# Patient Record
Sex: Male | Born: 1945 | Race: White | Hispanic: No | Marital: Married | Smoking: Never smoker
Health system: Southern US, Community
[De-identification: ages and names within clinical notes are randomized; demographics above are authoritative.]

## PROBLEM LIST (undated history)

## (undated) DIAGNOSIS — K219 Gastro-esophageal reflux disease without esophagitis: Secondary | ICD-10-CM

## (undated) DIAGNOSIS — I2510 Atherosclerotic heart disease of native coronary artery without angina pectoris: Secondary | ICD-10-CM

## (undated) DIAGNOSIS — F410 Panic disorder [episodic paroxysmal anxiety] without agoraphobia: Secondary | ICD-10-CM

## (undated) DIAGNOSIS — Z9889 Other specified postprocedural states: Secondary | ICD-10-CM

## (undated) DIAGNOSIS — F419 Anxiety disorder, unspecified: Secondary | ICD-10-CM

## (undated) DIAGNOSIS — I10 Essential (primary) hypertension: Secondary | ICD-10-CM

## (undated) DIAGNOSIS — R112 Nausea with vomiting, unspecified: Secondary | ICD-10-CM

## (undated) DIAGNOSIS — M1990 Unspecified osteoarthritis, unspecified site: Secondary | ICD-10-CM

## (undated) DIAGNOSIS — E785 Hyperlipidemia, unspecified: Secondary | ICD-10-CM

## (undated) DIAGNOSIS — N400 Enlarged prostate without lower urinary tract symptoms: Secondary | ICD-10-CM

## (undated) HISTORY — PX: TONSILLECTOMY AND ADENOIDECTOMY: SUR1326

## (undated) HISTORY — DX: Benign prostatic hyperplasia without lower urinary tract symptoms: N40.0

## (undated) HISTORY — DX: Hyperlipidemia, unspecified: E78.5

## (undated) HISTORY — PX: JOINT REPLACEMENT: SHX530

## (undated) HISTORY — PX: OTHER SURGICAL HISTORY: SHX169

## (undated) HISTORY — PX: KNEE ARTHROSCOPY: SUR90

## (undated) HISTORY — PX: MOHS SURGERY: SUR867

## (undated) HISTORY — PX: FEMUR SURGERY: SHX943

## (undated) HISTORY — PX: APPENDECTOMY: SHX54

## (undated) HISTORY — PX: NASAL SEPTUM SURGERY: SHX37

---

## 1993-04-04 DIAGNOSIS — C4491 Basal cell carcinoma of skin, unspecified: Secondary | ICD-10-CM

## 1993-04-04 HISTORY — DX: Basal cell carcinoma of skin, unspecified: C44.91

## 2004-11-15 ENCOUNTER — Ambulatory Visit (HOSPITAL_COMMUNITY): Admission: RE | Admit: 2004-11-15 | Discharge: 2004-11-15 | Payer: Self-pay | Admitting: Gastroenterology

## 2012-01-01 ENCOUNTER — Encounter: Payer: Self-pay | Admitting: Cardiology

## 2012-01-01 ENCOUNTER — Ambulatory Visit (HOSPITAL_COMMUNITY)
Admission: RE | Admit: 2012-01-01 | Discharge: 2012-01-01 | Disposition: A | Discharge status: Home / Self Care | Payer: 59 | Source: Ambulatory Visit | Attending: Internal Medicine | Admitting: Internal Medicine

## 2012-01-01 ENCOUNTER — Encounter: Payer: Self-pay | Admitting: *Deleted

## 2012-01-01 ENCOUNTER — Ambulatory Visit (INDEPENDENT_AMBULATORY_CARE_PROVIDER_SITE_OTHER): Payer: 59 | Admitting: Cardiology

## 2012-01-01 DIAGNOSIS — E785 Hyperlipidemia, unspecified: Secondary | ICD-10-CM

## 2012-01-01 DIAGNOSIS — R9431 Abnormal electrocardiogram [ECG] [EKG]: Secondary | ICD-10-CM | POA: Insufficient documentation

## 2012-01-01 DIAGNOSIS — R079 Chest pain, unspecified: Secondary | ICD-10-CM

## 2012-01-01 DIAGNOSIS — R9439 Abnormal result of other cardiovascular function study: Secondary | ICD-10-CM

## 2012-01-01 NOTE — Assessment & Plan Note (Signed)
I will defer to GREEN, Lorenda Ishihara, MD, MD.  The goals for LDL /HDL will be based on the presence or absence of CAD

## 2012-01-01 NOTE — Assessment & Plan Note (Signed)
The patient has an abnormal stress test as described. This likely indicates coronary artery disease. Cardiac catheterization is indicated. He would like to wait until his wife is back in town as she's going out of town this weekend. He's not having any unstable symptoms. He has been started on aspirin and a low-dose beta blocker. He will not perform any of his usual physical activities. He'll come back for elective cardiac catheterization. I have discussed with him all the risks benefits. He also knows if he has any unstable symptoms such as resting discomfort he needs to call 911.

## 2012-01-01 NOTE — Progress Notes (Signed)
 HPI The patient is referred for evaluation of an abnormal exercise treadmill test. He has no past cardiac history. However, over the last month he has noticed some chest discomfort. He first noticed this walking up a trail hill.  He noticed some slight left-sided discomfort. He thought he had more dyspnea with exertion than previous. He may have felt less exercise tolerance. He says he's had a persistent mild discomfort now that he thinks about it for several weeks. However, he was ignoring this until he started getting some lightheadedness with exertion accompanying the discomfort. He saw his primary provider today and had an exercise treadmill test. At 4-1/2 minutes there was some chest discomfort. He had ST depression in lead V4.  this progressed to be fairly marked ST depression in leads V4 through V6 and the inferior leads by 7 minutes. This resolved by 2 minutes of recovery.  The patient otherwise has had no past cardiac workup. He's had no PND or orthopnea. He denies any palpitations, presyncope or syncope. He is active despite having joint pains particularly his knees.  Not on File  Current Outpatient Prescriptions  Medication Sig Dispense Refill  . aspirin 81 MG tablet Take 160 mg by mouth daily.      . Omeprazole Magnesium (PRILOSEC OTC PO) Take by mouth.        Past Medical History  Diagnosis Date  . Hyperlipidemia   . BPH (benign prostatic hyperplasia)     Past Surgical History  Procedure Date  . Knee arthroscopy     bilateral  . Appendectomy   . Tonsillectomy and adenoidectomy   . Nasal septum surgery   . Femur surgery     Family History  Problem Relation Age of Onset  . Coronary artery disease Father     "Hardening of the arteries"    History   Social History  . Marital Status: Married    Spouse Name: N/A    Number of Children: 1  . Years of Education: N/A   Occupational History  . Not on file.   Social History Main Topics  . Smoking status: Never  Smoker   . Smokeless tobacco: Not on file  . Alcohol Use: Not on file  . Drug Use: Not on file  . Sexually Active: Not on file   Other Topics Concern  . Not on file   Social History Narrative   Artist, makes furniture.   ROS:  Constipation.  Otherwise as stated in the HPI and negative for all other systems.  PHYSICAL EXAM BP 110/80  Pulse 68  Ht 6' 6" (1.981 m)  Wt 176 lb (79.833 kg)  BMI 20.34 kg/m2 GENERAL:  Well appearing HEENT:  Pupils equal round and reactive, fundi not visualized, oral mucosa unremarkable NECK:  No jugular venous distention, waveform within normal limits, carotid upstroke brisk and symmetric, no bruits, no thyromegaly LYMPHATICS:  No cervical, inguinal adenopathy LUNGS:  Clear to auscultation bilaterally BACK:  No CVA tenderness CHEST:  Unremarkable HEART:  PMI not displaced or sustained,S1 and S2 within normal limits, no S3, no S4, no clicks, no rubs, no murmurs ABD:  Flat, positive bowel sounds normal in frequency in pitch, no bruits, no rebound, no guarding, no midline pulsatile mass, no hepatomegaly, no splenomegaly EXT:  2 plus pulses throughout, no edema, no cyanosis no clubbing SKIN:  No rashes no nodules NEURO:  Cranial nerves II through XII grossly intact, motor grossly intact throughout PSYCH:  Cognitively intact, oriented to person place and   time  EKG:  Sinus rhythm, rate 68, nonspecific inferior T wave inversions, rightward axis, early transition in lead V2. 01/01/2012   ASSESSMENT AND PLAN    

## 2012-01-01 NOTE — Patient Instructions (Addendum)
Your physician has requested that you have a cardiac catheterization. Cardiac catheterization is used to diagnose and/or treat various heart conditions. Doctors may recommend this procedure for a number of different reasons. The most common reason is to evaluate chest pain. Chest pain can be a symptom of coronary artery disease (CAD), and cardiac catheterization can show whether plaque is narrowing or blocking your heart's arteries. This procedure is also used to evaluate the valves, as well as measure the blood flow and oxygen levels in different parts of your heart. For further information please visit [email or web address] Please follow instruction sheet, as given.  The current medical regimen is effective;  continue present plan and medications.  Please have blood work today

## 2012-01-02 ENCOUNTER — Encounter (HOSPITAL_COMMUNITY): Payer: Self-pay | Admitting: Respiratory Therapy

## 2012-01-02 LAB — CBC WITH DIFFERENTIAL/PLATELET
Basophils Absolute: 0.1 10*3/uL (ref 0.0–0.1)
Eosinophils Relative: 3.5 % (ref 0.0–5.0)
HCT: 40.5 % (ref 39.0–52.0)
Hemoglobin: 13.6 g/dL (ref 13.0–17.0)
Lymphs Abs: 1.8 10*3/uL (ref 0.7–4.0)
MCV: 96.9 fl (ref 78.0–100.0)
Monocytes Absolute: 0.5 10*3/uL (ref 0.1–1.0)
Monocytes Relative: 6.6 % (ref 3.0–12.0)
Neutro Abs: 4.2 10*3/uL (ref 1.4–7.7)
Platelets: 212 10*3/uL (ref 150.0–400.0)
RDW: 13.1 % (ref 11.5–14.6)

## 2012-01-02 LAB — BASIC METABOLIC PANEL
BUN: 17 mg/dL (ref 6–23)
Chloride: 102 mEq/L (ref 96–112)
GFR: 82.23 mL/min (ref >60.00)
Glucose, Bld: 85 mg/dL (ref 70–99)
Potassium: 4 mEq/L (ref 3.5–5.1)
Sodium: 140 mEq/L (ref 135–145)

## 2012-01-04 ENCOUNTER — Other Ambulatory Visit: Payer: Self-pay | Admitting: Cardiology

## 2012-01-05 ENCOUNTER — Encounter (HOSPITAL_COMMUNITY): Payer: Self-pay | Admitting: *Deleted

## 2012-01-05 ENCOUNTER — Other Ambulatory Visit: Payer: Self-pay

## 2012-01-05 ENCOUNTER — Encounter (HOSPITAL_COMMUNITY)
Admission: RE | Disposition: A | Discharge status: Home / Self Care | Payer: Self-pay | Source: Ambulatory Visit | Attending: Cardiology

## 2012-01-05 ENCOUNTER — Ambulatory Visit (HOSPITAL_COMMUNITY)
Admission: RE | Admit: 2012-01-05 | Discharge: 2012-01-06 | Disposition: A | Discharge status: Home / Self Care | Payer: 59 | Source: Ambulatory Visit | Attending: Cardiology | Admitting: Cardiology

## 2012-01-05 DIAGNOSIS — I209 Angina pectoris, unspecified: Secondary | ICD-10-CM | POA: Diagnosis not present

## 2012-01-05 DIAGNOSIS — E785 Hyperlipidemia, unspecified: Secondary | ICD-10-CM | POA: Diagnosis not present

## 2012-01-05 DIAGNOSIS — I369 Nonrheumatic tricuspid valve disorder, unspecified: Secondary | ICD-10-CM

## 2012-01-05 DIAGNOSIS — I2510 Atherosclerotic heart disease of native coronary artery without angina pectoris: Secondary | ICD-10-CM

## 2012-01-05 DIAGNOSIS — R9439 Abnormal result of other cardiovascular function study: Secondary | ICD-10-CM

## 2012-01-05 HISTORY — DX: Atherosclerotic heart disease of native coronary artery without angina pectoris: I25.10

## 2012-01-05 HISTORY — PX: LEFT HEART CATHETERIZATION WITH CORONARY ANGIOGRAM: SHX5451

## 2012-01-05 HISTORY — DX: Unspecified osteoarthritis, unspecified site: M19.90

## 2012-01-05 LAB — POCT ACTIVATED CLOTTING TIME: Activated Clotting Time: 309 seconds

## 2012-01-05 SURGERY — LEFT HEART CATHETERIZATION WITH CORONARY ANGIOGRAM
Anesthesia: LOCAL

## 2012-01-05 MED ORDER — BIVALIRUDIN 250 MG IV SOLR
0.2500 mg/kg | Freq: Once | INTRAVENOUS | Status: AC
  Administered 2012-01-05: 19.7 mg via INTRAVENOUS

## 2012-01-05 MED ORDER — ONDANSETRON HCL 4 MG/2ML IJ SOLN: 4.0000 mg | Freq: Four times a day (QID) | INTRAMUSCULAR | Status: DC | PRN

## 2012-01-05 MED ORDER — MIDAZOLAM HCL 2 MG/2ML IJ SOLN
INTRAMUSCULAR | Status: AC
  Filled 2012-01-05: qty 2

## 2012-01-05 MED ORDER — ASPIRIN 81 MG PO CHEW: 81.0000 mg | CHEWABLE_TABLET | Freq: Every day | ORAL | Status: DC

## 2012-01-05 MED ORDER — OXYCODONE-ACETAMINOPHEN 5-325 MG PO TABS
1.0000 | ORAL_TABLET | ORAL | Status: DC | PRN
Start: 2012-01-05 — End: 2012-01-06
  Administered 2012-01-05: 2 via ORAL
  Administered 2012-01-06: 1 via ORAL
  Filled 2012-01-05: qty 1
  Filled 2012-01-05: qty 2

## 2012-01-05 MED ORDER — METOPROLOL SUCCINATE ER 50 MG PO TB24
50.0000 mg | ORAL_TABLET | Freq: Every day | ORAL | Status: DC
  Administered 2012-01-06: 50 mg via ORAL
  Filled 2012-01-05: qty 1

## 2012-01-05 MED ORDER — ALPRAZOLAM 0.25 MG PO TABS
0.2500 mg | ORAL_TABLET | Freq: Two times a day (BID) | ORAL | Status: DC | PRN
  Administered 2012-01-05: 0.25 mg via ORAL
  Filled 2012-01-05: qty 1

## 2012-01-05 MED ORDER — NITROGLYCERIN 0.2 MG/ML ON CALL CATH LAB
INTRAVENOUS | Status: AC
  Filled 2012-01-05: qty 1

## 2012-01-05 MED ORDER — VERAPAMIL HCL 2.5 MG/ML IV SOLN
INTRAVENOUS | Status: AC
  Filled 2012-01-05: qty 2

## 2012-01-05 MED ORDER — SODIUM CHLORIDE 0.9 % IJ SOLN: 3.0000 mL | INTRAMUSCULAR | Status: DC | PRN

## 2012-01-05 MED ORDER — LIDOCAINE HCL (PF) 1 % IJ SOLN
INTRAMUSCULAR | Status: AC
  Filled 2012-01-05: qty 30

## 2012-01-05 MED ORDER — SODIUM CHLORIDE 0.9 % IV SOLN: INTRAVENOUS | Status: AC

## 2012-01-05 MED ORDER — LIDOCAINE-EPINEPHRINE 1 %-1:100000 IJ SOLN
INTRAMUSCULAR | Status: AC
  Filled 2012-01-05: qty 1

## 2012-01-05 MED ORDER — ASPIRIN 81 MG PO CHEW
324.0000 mg | CHEWABLE_TABLET | ORAL | Status: AC
  Administered 2012-01-05: 324 mg via ORAL

## 2012-01-05 MED ORDER — LIDOCAINE-EPINEPHRINE 1 %-1:100000 IJ SOLN
6.0000 mL | Freq: Once | INTRAMUSCULAR | Status: AC
  Administered 2012-01-05: 6 mL via INTRADERMAL

## 2012-01-05 MED ORDER — FENTANYL CITRATE 0.05 MG/ML IJ SOLN
INTRAMUSCULAR | Status: AC
  Filled 2012-01-05: qty 2

## 2012-01-05 MED ORDER — CLOPIDOGREL BISULFATE 300 MG PO TABS
ORAL_TABLET | ORAL | Status: AC
  Administered 2012-01-06: 75 mg via ORAL
  Filled 2012-01-05: qty 2

## 2012-01-05 MED ORDER — ASPIRIN EC 81 MG PO TBEC
81.0000 mg | DELAYED_RELEASE_TABLET | Freq: Every day | ORAL | Status: DC
  Administered 2012-01-06: 81 mg via ORAL
  Filled 2012-01-05: qty 1

## 2012-01-05 MED ORDER — ASPIRIN 81 MG PO CHEW
CHEWABLE_TABLET | ORAL | Status: AC
  Administered 2012-01-05: 324 mg via ORAL
  Filled 2012-01-05: qty 4

## 2012-01-05 MED ORDER — ASPIRIN 81 MG PO TABS: 81.0000 mg | ORAL_TABLET | Freq: Every day | ORAL | Status: DC

## 2012-01-05 MED ORDER — ACETAMINOPHEN 325 MG PO TABS
650.0000 mg | ORAL_TABLET | ORAL | Status: DC | PRN
  Administered 2012-01-06: 650 mg via ORAL
  Filled 2012-01-05: qty 2

## 2012-01-05 MED ORDER — SODIUM CHLORIDE 0.9 % IJ SOLN
3.0000 mL | Freq: Two times a day (BID) | INTRAMUSCULAR | Status: DC
  Administered 2012-01-05: 3 mL via INTRAVENOUS

## 2012-01-05 MED ORDER — SODIUM CHLORIDE 0.9 % IV SOLN
INTRAVENOUS | Status: DC
  Administered 2012-01-05: 1000 mL via INTRAVENOUS

## 2012-01-05 MED ORDER — HEPARIN (PORCINE) IN NACL 2-0.9 UNIT/ML-% IJ SOLN
INTRAMUSCULAR | Status: AC
  Filled 2012-01-05: qty 2000

## 2012-01-05 MED ORDER — BIVALIRUDIN 250 MG IV SOLR
INTRAVENOUS | Status: AC
  Filled 2012-01-05: qty 250

## 2012-01-05 MED ORDER — SODIUM CHLORIDE 0.9 % IV SOLN: 250.0000 mL | INTRAVENOUS | Status: DC | PRN

## 2012-01-05 MED ORDER — CLOPIDOGREL BISULFATE 75 MG PO TABS
75.0000 mg | ORAL_TABLET | Freq: Every day | ORAL | Status: DC
  Administered 2012-01-06: 75 mg via ORAL
  Filled 2012-01-05: qty 1

## 2012-01-05 NOTE — Progress Notes (Signed)
  Echocardiogram 2D Echocardiogram has been performed.  Peter Morgan, Real Cons 01/05/2012, 3:28 PM

## 2012-01-05 NOTE — Interval H&P Note (Signed)
History and Physical Interval Note:  01/05/2012 8:41 AM  Peter Morgan  has presented today for surgery, with the diagnosis of positive stress test  The various methods of treatment have been discussed with the patient and family. After consideration of risks, benefits and other options for treatment, the patient has consented to  Procedure(s): LEFT HEART CATHETERIZATION WITH CORONARY ANGIOGRAM as a surgical intervention .  The patients' history has been reviewed, patient examined, no change in status, stable for surgery.  I have reviewed the patients' chart and labs.  Questions were answered to the patient's satisfaction.     Rollene Rotunda

## 2012-01-05 NOTE — Progress Notes (Signed)
TR BAND REMOVAL  LOCATION:  right radial  DEFLATED PER PROTOCOL:  yes  TIME BAND OFF / DRESSING APPLIED:   1515   SITE UPON ARRIVAL:   Level 0  SITE AFTER BAND REMOVAL:  Level 0  REVERSE ALLEN'S TEST:    positive  CIRCULATION SENSATION AND MOVEMENT:  Within Normal Limits  yes  COMMENTS:  During deflation site began to bleed re- applied air  then was able to  deflate

## 2012-01-05 NOTE — Procedures (Signed)
CARDIAC CATH NOTE  Name: Peter Morgan MRN: [id number] DOB: November 23, 1946  Procedure: PTCA and stenting of the obtuse marginal branch of the LCx  Indication: Abnormal stress test, exertional angina  Procedural Details: The patient has undergone diagnostic catheterization. He has been noted to have a severe stenosis involving the ostium of the obtuse marginal branch. We elected to proceed with PCI after review of his films and clinical situation. There was an indwelling 5 French sheath in the right femoral artery. This was upsized to a 6 [place] sheath over a long wire. Weight-based bivalirudin was given for anticoagulation. Once a therapeutic ACT was achieved, a 6 [place] XB 3.5 cm guide catheter was inserted.  A cougar coronary guidewire was used to cross the lesion.  The lesion was predilated with a 2.5 x 12 mm balloon.  Following PTCA, there was a focal dissection in the patient had significant chest pain. There did not appear to be any flow limitation. I decided to stent the lesion with full coverage of the ostium. The lesion was then stented with a 2.75 x 20 mm Promus element drug-eluting stent.  The stent was postdilated with a 3.0 x 15 mm noncompliant balloon to 14 atmospheres for 2 inflations.  Following PCI, there was 0% residual stenosis and TIMI-3 flow. Final angiography confirmed an excellent result. There was no compromise of the AV circumflex. The patient tolerated the procedure well. There were no immediate procedural complications. Femoral hemostasis was achieved with a Perclose device. The patient was transferred to the post catheterization recovery area for further monitoring. He was chest pain-free at the completion of the procedure.  Conclusions: Successful percutaneous intervention of the obtuse marginal branch of the left circumflex utilizing a drug-eluting stent platform  Recommendations: Dual antiplatelet therapy with aspirin and Plavix for a minimum of 12 months.  Tonny Bollman 01/05/2012, 9:29 AM

## 2012-01-05 NOTE — H&P (View-Only) (Signed)
HPI The patient is referred for evaluation of an abnormal exercise treadmill test. He has no past cardiac history. However, over the last month he has noticed some chest discomfort. He first noticed this walking up a trail hill.  He noticed some slight left-sided discomfort. He thought he had more dyspnea with exertion than previous. He may have felt less exercise tolerance. He says he's had a persistent mild discomfort now that he thinks about it for several weeks. However, he was ignoring this until he started getting some lightheadedness with exertion accompanying the discomfort. He saw his primary provider today and had an exercise treadmill test. At 4-1/2 minutes there was some chest discomfort. He had ST depression in lead V4.  this progressed to be fairly marked ST depression in leads V4 through V6 and the inferior leads by 7 minutes. This resolved by 2 minutes of recovery.  The patient otherwise has had no past cardiac workup. He's had no PND or orthopnea. He denies any palpitations, presyncope or syncope. He is active despite having joint pains particularly his knees.  Not on File  Current Outpatient Prescriptions  Medication Sig Dispense Refill  . aspirin 81 MG tablet Take 160 mg by mouth daily.      . Omeprazole Magnesium (PRILOSEC OTC PO) Take by mouth.        Past Medical History  Diagnosis Date  . Hyperlipidemia   . BPH (benign prostatic hyperplasia)     Past Surgical History  Procedure Date  . Knee arthroscopy     bilateral  . Appendectomy   . Tonsillectomy and adenoidectomy   . Nasal septum surgery   . Femur surgery     Family History  Problem Relation Age of Onset  . Coronary artery disease Father     "Hardening of the arteries"    History   Social History  . Marital Status: Married    Spouse Name: N/A    Number of Children: 1  . Years of Education: N/A   Occupational History  . Not on file.   Social History Main Topics  . Smoking status: Never  Smoker   . Smokeless tobacco: Not on file  . Alcohol Use: Not on file  . Drug Use: Not on file  . Sexually Active: Not on file   Other Topics Concern  . Not on file   Social History Narrative   Artist, makes furniture.   ROS:  Constipation.  Otherwise as stated in the HPI and negative for all other systems.  PHYSICAL EXAM BP 110/80  Pulse 68  Ht 6\' 6"  (1.981 m)  Wt 176 lb (79.833 kg)  BMI 20.34 kg/m2 GENERAL:  Well appearing HEENT:  Pupils equal round and reactive, fundi not visualized, oral mucosa unremarkable NECK:  No jugular venous distention, waveform within normal limits, carotid upstroke brisk and symmetric, no bruits, no thyromegaly LYMPHATICS:  No cervical, inguinal adenopathy LUNGS:  Clear to auscultation bilaterally BACK:  No CVA tenderness CHEST:  Unremarkable HEART:  PMI not displaced or sustained,S1 and S2 within normal limits, no S3, no S4, no clicks, no rubs, no murmurs ABD:  Flat, positive bowel sounds normal in frequency in pitch, no bruits, no rebound, no guarding, no midline pulsatile mass, no hepatomegaly, no splenomegaly EXT:  2 plus pulses throughout, no edema, no cyanosis no clubbing SKIN:  No rashes no nodules NEURO:  Cranial nerves II through XII grossly intact, motor grossly intact throughout PSYCH:  Cognitively intact, oriented to person place and  time  EKG:  Sinus rhythm, rate 68, nonspecific inferior T wave inversions, rightward axis, early transition in lead V2. 01/01/2012   ASSESSMENT AND PLAN

## 2012-01-05 NOTE — Procedures (Signed)
  Cardiac Catheterization Procedure Note  Name: Peter Morgan MRN: [id number] DOB: 1946/11/03  Procedure: Left Heart Cath, Selective Coronary Angiography, LV angiography  Indication:  Abnormal ETT, chest pain  Procedural details: The right groin was prepped, draped, and anesthetized with 1% lidocaine. Using modified Seldinger technique, a 5 French sheath was introduced into the right femoral artery. Standard Judkins catheters were used for coronary angiography and left ventriculography. Catheter exchanges were performed over a guidewire. There were no immediate procedural complications. The patient was transferred to the post catheterization recovery area for further monitoring.  I attempted a right radial approach. I was able to cannulate the vessel with an anterior wall puncture. A number [place] radial sheath was inserted. However, because of tortuosity of the vessel in the antecubital fossa I was unable to advance a guidewire and abandoned this approach.  Procedural Findings:  Hemodynamics:     AO 112/64    LV 117/12   Coronary angiography:  Coronary dominance: Right  Left mainstem:   Proximal 25%.  Left anterior descending (LAD):   Tortuous, wraps the apex, proximal 25% stenosis, mid luminal irregularities. First diagonal was tiny and free of high-grade disease. Second diagonal is small and free of high-grade disease.  Left circumflex (LCx):  AV groove is large. There's long proximal 25% stenosis. There is a very large first obtuse marginal with long ostial 95% stenosis. There is a first posterior lateral which is moderate sized and normal. Second posterior lateral is small and normal.  Right coronary artery (RCA):  Large. Proximal luminal irregularities. Mid 25% stenosis. PDA is large to moderate size and free of high-grade disease. Small posterior lateral normal.  Left ventriculography: Left ventricular systolic function is normal, LVEF is estimated at 55-65%, there is mitral  regurgitation which might be catheter induced.  Final Conclusions:  High-grade mid obtuse marginal stenosis. I reviewed this with Dr. Excell Seltzer. The patient will have PCI.  Recommendations:   Rollene Rotunda 01/05/2012, 8:43 AM

## 2012-01-06 ENCOUNTER — Other Ambulatory Visit: Payer: Self-pay

## 2012-01-06 ENCOUNTER — Encounter (HOSPITAL_COMMUNITY): Payer: Self-pay | Admitting: Physician Assistant

## 2012-01-06 DIAGNOSIS — I2510 Atherosclerotic heart disease of native coronary artery without angina pectoris: Secondary | ICD-10-CM | POA: Insufficient documentation

## 2012-01-06 DIAGNOSIS — E785 Hyperlipidemia, unspecified: Secondary | ICD-10-CM | POA: Diagnosis not present

## 2012-01-06 DIAGNOSIS — I209 Angina pectoris, unspecified: Secondary | ICD-10-CM | POA: Diagnosis not present

## 2012-01-06 LAB — BASIC METABOLIC PANEL
BUN: 13 mg/dL (ref 6–23)
CO2: 29 mEq/L (ref 19–32)
Chloride: 105 mEq/L (ref 96–112)
Creatinine, Ser: 0.84 mg/dL (ref 0.50–1.35)
Glucose, Bld: 102 mg/dL — ABNORMAL HIGH (ref 70–99)
Potassium: 3.7 mEq/L (ref 3.5–5.1)

## 2012-01-06 LAB — HEPATIC FUNCTION PANEL
ALT: 13 U/L (ref 0–53)
AST: 18 U/L (ref 0–37)
Alkaline Phosphatase: 49 U/L (ref 39–117)
Bilirubin, Direct: <0.1 mg/dL (ref 0.0–0.3)
Total Bilirubin: 0.2 mg/dL — ABNORMAL LOW (ref 0.3–1.2)

## 2012-01-06 LAB — CBC
HCT: 36.3 % — ABNORMAL LOW (ref 39.0–52.0)
Hemoglobin: 12.1 g/dL — ABNORMAL LOW (ref 13.0–17.0)
MCV: 95.5 fL (ref 78.0–100.0)
WBC: 7.5 10*3/uL (ref 4.0–10.5)

## 2012-01-06 MED ORDER — PANTOPRAZOLE SODIUM 40 MG PO TBEC: 40.0000 mg | DELAYED_RELEASE_TABLET | Freq: Every day | ORAL | Status: DC

## 2012-01-06 MED ORDER — CLOPIDOGREL BISULFATE 75 MG PO TABS: 75.0000 mg | ORAL_TABLET | Freq: Every day | ORAL | Status: DC

## 2012-01-06 MED ORDER — NITROGLYCERIN 0.4 MG SL SUBL: 0.4000 mg | SUBLINGUAL_TABLET | SUBLINGUAL | Status: DC | PRN

## 2012-01-06 MED ORDER — ATORVASTATIN CALCIUM 20 MG PO TABS: 20.0000 mg | ORAL_TABLET | Freq: Every day | ORAL | Status: DC

## 2012-01-06 MED FILL — Dextrose Inj 5%: INTRAVENOUS | Qty: 50 | Status: AC

## 2012-01-06 NOTE — Discharge Summary (Signed)
Discharge Summary   Patient ID: Peter Morgan MRN: [id number], DOB/AGE: 1946/04/10 66 y.o. Admit date: 01/05/2012 D/C date:     01/06/2012   Primary Discharge Diagnoses:  1. Newly diagnosed CAD this admission - high grade OM stenosis s/p DES 01/05/12 - mild residual non-obstructive LAD, Lcx, RCA disease - normal LV function with EF 55-65% by cath 01/05/12 2. Hyperlipidemia - intitiated on statin this admission, instructed to f/u PCP 6 weeks for FLP/LFTs  Secondary Discharge Diagnoses:  1. BPH 2. Arthritis  Surgical Hx: notable for bilateral knee arthroscopy, appendectomy, tonsillectomy/adenoidectomy, nasal septum and femur surgery.  Hospital Course: [name] presented to Peter Morgan's office initially for follow-up of an abnormal exercise treadmill test. Over the last month he noticed some chest discomfort, left-sided particularly with exertion as well as more dyspnea with exertion. He had a mild persistent  discomfort for several weeks. He saw his primary provider 01/01/12 & had an exercise treadmill test. At 4-1/2 minutes there was some chest discomfort. He had ST depression in lead V4. this progressed to be fairly marked ST depression in leads V4 through V6 and the inferior leads by 7 minutes. This resolved by 2 minutes of recovery. EKG in the office showed sinus rhythm, rate 68, nonspecific inferior T wave inversions, rightward axis, early transition in lead V2. Because of the abnormal test and his symptoms, cardiac cathwas recommended. The patient preferred to wait until his wife is back in town as she was going out of town that weekend. His aspirin and BB were continued. He underwent diagnostic cath 01/05/12 by Dr. Antoine Morgan 01/05/12 demonstrating high-grade mid obtuse marginal stenosis with otherwise non-obstructive disease. He underwent successful PCI/DES to the OM by Dr. Excell Morgan and was started on ASA/Plavix therapy. The patient tolerated the procedure well. He  ambulated well and was educated by cardiac rehab. He was started on a statin at discharge and since his lipids have been followed by Dr. Chilton Morgan, he was instructed to contact his office for follow-up FLP/LFTs in 6 weeks. Dr. Excell Morgan has seen and examined him today and feels he is stable for discharge.  Discharge Vitals: Blood pressure 119/68, pulse 62, temperature 98 F (36.7 C), temperature source Oral, resp. rate 18, height 5' 9.5" (1.765 m), weight 174 lb (78.926 kg), SpO2 95.00%.  Labs: Lab Results  Component Value Date   WBC 7.5 01/06/2012   HGB 12.1* 01/06/2012   HCT 36.3* 01/06/2012   MCV 95.5 01/06/2012   PLT 144* 01/06/2012    Lab 01/06/12 0422  NA 140  K 3.7  CL 105  CO2 29  BUN 13  CREATININE 0.84  CALCIUM 9.1  PROT --  BILITOT --  ALKPHOS --  ALT --  AST --  GLUCOSE 102*   Diagnostic Studies/Procedures   1. Cardiac catheterization 01/05/12, please see full report and above for summary - note on original cath report there was question of MR but it was felt catheter induced. F/u echo showed no MR.  2. 2D echo 01/05/12 Study Conclusions - Left ventricle: The cavity size was normal. Wall thickness was normal. The estimated ejection fraction was 60%. Wall motion was normal; there were no regional wall motion abnormalities. - Mitral valve: No regurgitation. - Right ventricle: The cavity size was mildly dilated. - Right atrium: The atrium was mildly dilated.   Discharge Medications   Medication List  As of 01/06/2012  8:11 AM   STOP taking these medications  omeprazole 20 MG tablet         TAKE these medications         aspirin 81 MG tablet   Take 81 mg by mouth daily.      atorvastatin 20 MG tablet   Commonly known as: LIPITOR   Take 1 tablet (20 mg total) by mouth at bedtime.      clopidogrel 75 MG tablet   Commonly known as: PLAVIX   Take 1 tablet (75 mg total) by mouth daily with breakfast.      metoprolol succinate 50 MG 24 hr tablet   Commonly known  as: TOPROL-XL   Take 50 mg by mouth daily. Take with or immediately following a meal.      nitroGLYCERIN 0.4 MG SL tablet   Commonly known as: NITROSTAT   Place 1 tablet (0.4 mg total) under the tongue every 5 (five) minutes as needed for chest pain. Up to 3 doses      pantoprazole 40 MG tablet   Commonly known as: PROTONIX   Take 1 tablet (40 mg total) by mouth daily.           Omeprazole was stopped and changed to Protonix given concurrent Plavix use.  Disposition   The patient will be discharged in stable condition to home. Discharge Orders    Future Appointments: Provider: Department: Dept Phone: Center:   01/20/2012 9:50 AM Peter Lecher, PA [facility] [phone] [facility]     Future Orders Please Complete By Expires   Diet - low sodium heart healthy      Increase activity slowly      Comments:   No driving for 2 days. No lifting over 5 lbs for 1 week. No sexual activity for 1 week. Keep procedure site clean & dry. If you notice increased pain, swelling, bleeding or pus, call/return!  You may shower, but no soaking baths/hot tubs/pools for 1 week.       Follow-up Information    Follow up with GREEN, Lorenda Ishihara, MD. (Please continue to follow up with your primary care doctor. You will need a fasting lipid panel and liver function tests repeated in 6 weeks. )    Contact information:   [address] [place] [address] [phone]       Follow up with Peter Newcomer, PA. (You will have your first follow-up appointment with Peter Newcomer PA-C at [facility] 01/20/12 at 9:50am)    Contact information:   [address] [place] [address] [phone]            Duration of Discharge Encounter: Greater than 30 minutes including physician and PA time.  Signed, Peter Basque Dunn PA-C 01/06/2012, 8:11 AM

## 2012-01-06 NOTE — Progress Notes (Signed)
CARDIAC REHAB PHASE I   PRE:  Rate/Rhythm: 79 SR  BP:  Supine:   Sitting: 126/68  Standing:    SaO2:   MODE:  Ambulation: 680 ft   POST:  Rate/Rhythem: 87 SR  BP:  Supine:   Sitting: 144/78  Standing:    SaO2:  0740 0855 Pt tolerated ambulation well without c/o of cp or SOB. VS stable. Completed discharge education with pt. He agrees to [employer]. CRP in [place], will send referral.  Beatrix Fetters

## 2012-01-06 NOTE — Progress Notes (Signed)
Subjective:  No chest pain or dyspnea. Feels well this am.  Objective:  Vital Signs in the last 24 hours: Temp:  [97.7 F (36.5 C)-98 F (36.7 C)] 98 F (36.7 C) (01/22 0330) Pulse Rate:  [58-73] 62  (01/22 0330) Resp:  [11-18] 18  (01/21 1400) BP: (102-141)/(58-90) 119/68 mmHg (01/22 0330) SpO2:  [95 %-100 %] 95 % (01/22 0330)  Intake/Output from previous day: 01/21 0701 - 01/22 0700 In: 240 [P.O.:240] Out: -   Physical Exam: Pt is alert and oriented, NAD HEENT: normal Neck: JVP - normal, carotids 2+= without bruits Lungs: CTA bilaterally CV: RRR without murmur or gallop Abd: soft, NT, Positive BS, no hepatomegaly Ext: no C/C/E, distal pulses intact and equal. Right forearm with mild swelling, reverse Allen's positive Skin: warm/dry no rash   Lab Results:  Basename 01/06/12 0422  WBC 7.5  HGB 12.1*  PLT 144*    Basename 01/06/12 0422  NA 140  K 3.7  CL 105  CO2 29  GLUCOSE 102*  BUN 13  CREATININE 0.84   No results found for this basename: TROPONINI:2,CK,MB:2 in the last 72 hours  Cardiac Studies: 2D echo pending  Tele: sinus rhythm, no arrhythmia noted.  Assessment/Plan:  CAD with Class 3 Angina - s/p PCI of severe stenosis in the left circumflex. Continue ASA and plavix for 12 months. Add statin - can followup lipids with Dr Chilton Si. Follow-up Dr Koleen Distance 2 weeks.   Tonny Bollman, M.D. 01/06/2012, 7:51 AM

## 2012-01-07 NOTE — Discharge Summary (Signed)
Agree with above. See my progress note this same date.

## 2012-01-09 ENCOUNTER — Telehealth: Payer: Self-pay | Admitting: Physician Assistant

## 2012-01-09 NOTE — Telephone Encounter (Signed)
Note opened in error.

## 2012-01-15 ENCOUNTER — Encounter (HOSPITAL_COMMUNITY)
Admission: RE | Admit: 2012-01-15 | Discharge: 2012-01-15 | Disposition: A | Discharge status: Home / Self Care | Payer: 59 | Source: Ambulatory Visit | Attending: Cardiology | Admitting: Cardiology

## 2012-01-15 ENCOUNTER — Encounter (HOSPITAL_COMMUNITY): Payer: Self-pay

## 2012-01-19 ENCOUNTER — Encounter (HOSPITAL_COMMUNITY)
Admission: RE | Admit: 2012-01-19 | Discharge: 2012-01-19 | Disposition: A | Discharge status: Home / Self Care | Payer: 59 | Source: Ambulatory Visit | Attending: Cardiology | Admitting: Cardiology

## 2012-01-19 DIAGNOSIS — I2510 Atherosclerotic heart disease of native coronary artery without angina pectoris: Secondary | ICD-10-CM | POA: Insufficient documentation

## 2012-01-19 DIAGNOSIS — I209 Angina pectoris, unspecified: Secondary | ICD-10-CM | POA: Insufficient documentation

## 2012-01-19 DIAGNOSIS — Z5189 Encounter for other specified aftercare: Secondary | ICD-10-CM | POA: Insufficient documentation

## 2012-01-19 DIAGNOSIS — E785 Hyperlipidemia, unspecified: Secondary | ICD-10-CM | POA: Insufficient documentation

## 2012-01-19 DIAGNOSIS — Z9861 Coronary angioplasty status: Secondary | ICD-10-CM | POA: Insufficient documentation

## 2012-01-19 NOTE — Progress Notes (Addendum)
Pt started cardiac rehab today.  Pt tolerated light exercise without difficulty.  Telemetry NSR without ectopy, vital signs stable.Will continue to monitor the patient throughout  the program.  Mr Ragain has a follow up appointment with Tereso Newcomer PAC.  Will send today's exercise flow sheet via media manager for review.

## 2012-01-20 ENCOUNTER — Encounter: Payer: Self-pay | Admitting: *Deleted

## 2012-01-20 ENCOUNTER — Ambulatory Visit (INDEPENDENT_AMBULATORY_CARE_PROVIDER_SITE_OTHER): Payer: 59 | Admitting: Physician Assistant

## 2012-01-20 ENCOUNTER — Encounter: Payer: Self-pay | Admitting: Physician Assistant

## 2012-01-20 VITALS — BP 108/60 | HR 60 | Ht 69.0 in | Wt 180.0 lb

## 2012-01-20 DIAGNOSIS — E785 Hyperlipidemia, unspecified: Secondary | ICD-10-CM

## 2012-01-20 DIAGNOSIS — R42 Dizziness and giddiness: Secondary | ICD-10-CM

## 2012-01-20 DIAGNOSIS — I2510 Atherosclerotic heart disease of native coronary artery without angina pectoris: Secondary | ICD-10-CM

## 2012-01-20 MED ORDER — FLUTICASONE PROPIONATE 50 MCG/ACT NA SUSP: 2.0000 | Freq: Every day | NASAL | Status: DC

## 2012-01-20 MED ORDER — METOPROLOL SUCCINATE ER 25 MG PO TB24: 25.0000 mg | ORAL_TABLET | Freq: Every day | ORAL | Status: DC

## 2012-01-20 NOTE — Telephone Encounter (Signed)
This encounter was created in error - please disregard.

## 2012-01-20 NOTE — Assessment & Plan Note (Addendum)
Followed by PCP.  Goal LDL < 70.  He has been advised to have this checked in the next 6-8 weeks.

## 2012-01-20 NOTE — Assessment & Plan Note (Addendum)
Etiology not certain.  Do not think it is related to any cardiovascular issues.  Question if this is an atypical presentation for vestibular issues.  I will decrease his Toprol to 25 mg a day to see if this helps.  Although, he was having symptoms prior to starting this medication.  I will also give him a prescription for Flonase to use for the next 3-4 weeks to see if it is related to sinus congestion.  Otherwise, he can followup with his PCP.

## 2012-01-20 NOTE — Patient Instructions (Addendum)
Your physician recommends that you schedule a follow-up appointment in: 6-8 weeks with Dr Antoine Poche Your physician has recommended you make the following change in your medication: DECREASE Metoprolol to 1/2 tab daily and START Flonase 2 sprays each nostril once daily for 3-4 weeks. Make an appointment with your primary doctor for dizziness

## 2012-01-20 NOTE — Progress Notes (Signed)
[address] [place], [place]  [address] Phone: [phone] Fax:  [phone]  Date:  01/20/2012   Name:  Peter Morgan       DOB:  1946-01-06 MRN:  [id number]  PCP:  Dr. Nila Nephew  Primary Cardiologist:  Dr. Rollene Rotunda  Primary Electrophysiologist:  None    History of Present Illness: Peter Morgan is a 66 y.o. male who presents for post hospital follow up.  He was recently established with Dr. Antoine Poche do to a recent history of chest pain and an abnormal stress test.  Cardiac catheterization was performed 01/05/12:  proximal left main 25%, proximal LAD 25%, proximal circumflex 25%, ostial OM1 95%, mid RCA 25%, EF 55-65%.  PCI: Promus DES to the OM1.  He was discharged within 24 hours.  Echocardiogram 01/05/12: E. Of 60%, mild RVE, mild RAE, PASP 32.  Labs: Hemoglobin 12.1, potassium 3.7, creatinine 0.84.   He notes problems with dizziness.  This has been a problem for the last month and a half to 2 months.  It predated his abnormal stress test and heart catheterization.  He denies a spinning sensation.  He denies near syncope.  It is more of a lightheaded sensation.  He is not certain if it changes with positional changes.  He denies facial droop, difficulty with speech or unilateral weakness.  He denies any symptoms consistent with amaurosis fugax.  He denies chest pain, shortness of breath, syncope, orthopnea, PND or edema.  He is doing well with cardiac rehabilitation.  Past Medical History  Diagnosis Date  . Hyperlipidemia   . BPH (benign prostatic hyperplasia)   . Arthritis   . CAD (coronary artery disease)     Diagnosed 12/2011 following abnormal treadmill test - high grade OM stenosis s/p DES 01/05/12, mild residual non-obstructive LAD, Lcx, RCA disease.  Normal LV function with EF 55-65% by cath 01/05/12    Current Outpatient Prescriptions  Medication Sig Dispense Refill  . aspirin 81 MG tablet Take 81 mg by mouth daily.       Marland Kitchen atorvastatin  (LIPITOR) 20 MG tablet Take 1 tablet (20 mg total) by mouth at bedtime.  30 tablet  6  . clopidogrel (PLAVIX) 75 MG tablet Take 1 tablet (75 mg total) by mouth daily with breakfast.  30 tablet  6  . metoprolol succinate (TOPROL-XL) 50 MG 24 hr tablet Take 50 mg by mouth daily. Take with or immediately following a meal.      . nitroGLYCERIN (NITROSTAT) 0.4 MG SL tablet Place 1 tablet (0.4 mg total) under the tongue every 5 (five) minutes as needed for chest pain. Up to 3 doses  25 tablet  4  . pantoprazole (PROTONIX) 40 MG tablet Take 1 tablet (40 mg total) by mouth daily.  30 tablet  1    Allergies: No Known Allergies  History  Substance Use Topics  . Smoking status: Never Smoker   . Smokeless tobacco: Never Used  . Alcohol Use: 1.2 oz/week    2 Glasses of wine per week     ROS:  Please see the history of present illness.   He is not certain if he has sinus congestion.  This has been a problem for him in the past.   All other systems reviewed and negative.   PHYSICAL EXAM: VS:  BP 108/60  Pulse 60  Ht 5\' 9"  (1.753 m)  Wt 180 lb (81.647 kg)  BMI 26.58 kg/m2 Well nourished, well developed, in  no acute distress HEENT: normal Neck: no JVD Vascular: No carotid bruits Cardiac:  normal S1, S2; RRR; no murmur Lungs:  clear to auscultation bilaterally, no wheezing, rhonchi or rales Abd: soft, nontender, no hepatomegaly Ext: no edema; Right femoral artery site without hematoma or bruit Skin: warm and dry Neuro:  CNs 2-12 intact, no focal abnormalities noted, Strength is equal in bilateral upper and lower extremities, Romberg negative  EKG:  Sinus rhythm, heart rate 60, normal axis, nonspecific ST-T wave changes  ASSESSMENT AND PLAN:

## 2012-01-20 NOTE — Assessment & Plan Note (Signed)
Doing well post PCI.  Continue aspirin and Plavix.  Followup with Dr. Antoine Poche in 6-8 weeks.  Continue with cardiac rehabilitation.

## 2012-01-21 ENCOUNTER — Encounter (HOSPITAL_COMMUNITY)
Admission: RE | Admit: 2012-01-21 | Discharge: 2012-01-21 | Payer: 59 | Source: Ambulatory Visit | Attending: Cardiology | Admitting: Cardiology

## 2012-01-21 ENCOUNTER — Other Ambulatory Visit: Payer: Self-pay

## 2012-01-21 ENCOUNTER — Emergency Department (HOSPITAL_COMMUNITY)
Admission: EM | Admit: 2012-01-21 | Discharge: 2012-01-21 | Disposition: A | Discharge status: Home / Self Care | Payer: 59 | Attending: Emergency Medicine | Admitting: Emergency Medicine

## 2012-01-21 ENCOUNTER — Encounter (HOSPITAL_COMMUNITY): Payer: Self-pay | Admitting: *Deleted

## 2012-01-21 ENCOUNTER — Emergency Department (HOSPITAL_COMMUNITY): Payer: 59

## 2012-01-21 ENCOUNTER — Ambulatory Visit (HOSPITAL_COMMUNITY)
Admission: RE | Admit: 2012-01-21 | Discharge: 2012-01-21 | Disposition: A | Discharge status: Home / Self Care | Payer: 59 | Source: Ambulatory Visit | Attending: Internal Medicine | Admitting: Internal Medicine

## 2012-01-21 DIAGNOSIS — R079 Chest pain, unspecified: Secondary | ICD-10-CM | POA: Insufficient documentation

## 2012-01-21 DIAGNOSIS — E785 Hyperlipidemia, unspecified: Secondary | ICD-10-CM | POA: Insufficient documentation

## 2012-01-21 DIAGNOSIS — R0789 Other chest pain: Secondary | ICD-10-CM

## 2012-01-21 DIAGNOSIS — I2510 Atherosclerotic heart disease of native coronary artery without angina pectoris: Secondary | ICD-10-CM | POA: Insufficient documentation

## 2012-01-21 LAB — DIFFERENTIAL
Basophils Absolute: 0 K/uL (ref 0.0–0.1)
Basophils Relative: 0 % (ref 0–1)
Eosinophils Absolute: 0.2 K/uL (ref 0.0–0.7)
Eosinophils Relative: 3 % (ref 0–5)
Lymphocytes Relative: 26 % (ref 12–46)
Lymphs Abs: 1.9 K/uL (ref 0.7–4.0)
Monocytes Absolute: 0.5 K/uL (ref 0.1–1.0)
Monocytes Relative: 7 % (ref 3–12)
Neutro Abs: 4.7 K/uL (ref 1.7–7.7)
Neutrophils Relative %: 64 % (ref 43–77)

## 2012-01-21 LAB — CBC
HCT: 38.9 % — ABNORMAL LOW (ref 39.0–52.0)
Hemoglobin: 13.3 g/dL (ref 13.0–17.0)
MCH: 31.8 pg (ref 26.0–34.0)
MCHC: 34.2 g/dL (ref 30.0–36.0)
MCV: 93.1 fL (ref 78.0–100.0)
RBC: 4.18 MIL/uL — ABNORMAL LOW (ref 4.22–5.81)

## 2012-01-21 LAB — POCT I-STAT, CHEM 8
Calcium, Ion: 1.26 mmol/L (ref 1.12–1.32)
Creatinine, Ser: 1 mg/dL (ref 0.50–1.35)
Glucose, Bld: 88 mg/dL (ref 70–99)
HCT: 40 % (ref 39.0–52.0)
Hemoglobin: 13.6 g/dL (ref 13.0–17.0)

## 2012-01-21 NOTE — Progress Notes (Signed)
Peter Morgan reports having some left anterior chest pressure since lifting some laundry up the stair yesterday afternoon.  Peter Morgan reports having a sharp pain initially, then felt a dull heaviness since yesterday. Peter Morgan reports having the discomfort as a 3 on a 1-10 scale.  Peter Morgan place on the zoll telemetry rhythm sinus heart rate 68.  Blood pressure 118/70.  Oxygen placed on at 4l/min.  Peter Morgan still reports the discomfort as a 3/10 on a 1-10 scale.  12 lead ECG obtained per chest pain standing order.  Normal sinus rhythm.  Trish the Biomedical scientist paged.  Peter Morgan was taken to the Ed for further evaluation via stretcher on O2 with the zoll.  Report given to the ED RN.  Patients wife called and notified.

## 2012-01-21 NOTE — ED Notes (Signed)
PT reported Chest pressure on arrival to Cardiac rehab this morning.

## 2012-01-21 NOTE — ED Provider Notes (Addendum)
Medical screening examination/treatment/procedure(s) were conducted as a shared visit with non-physician practitioner(s) and myself.  I personally evaluated the patient during the encounter  He has had persistent chest pain since yesterday that started after carrying a laundry basket up stairs. The pain worsens with palpation on the left chest and movement of the left arm. The pain was initially sharp now is dull. Exam heart is regular rate and are regular without murmur. Lungs are clear to auscultation. Chest has mild, left anterior chest wall tenderness. Lungs are clear to auscultation.  Evaluation consistent with atypical for cardiac chest pain. EKG, and markers are negative. Will touch bases with his cardiologist prior to discharge.  Flint Melter, MD 01/21/12 1353  I discussed the case with Dr. Sanjuana Kava.   MDM: The patient's chest pain is consistent with musculoskeletal etiology. Doubt acute coronary syndrome, metabolic instability or occult infection. He is stable for discharge to followup with his cardiologist as scheduled.  Flint Melter, MD 01/21/12 [phone]

## 2012-01-21 NOTE — ED Notes (Signed)
Discharge instructions reviewed; verbalized understanding.  No questions asked; no further c/o's voiced.  Pt ambulatory to lobby.  NAD noted.

## 2012-01-21 NOTE — ED Provider Notes (Signed)
History     CSN: [id number]  Arrival date & time 01/21/12  1032   First MD Initiated Contact with Patient 01/21/12 1209      Chief Complaint  Patient presents with  . Chest Pain    Pt reports chest pressure since  Tues night. Pt went to cadiac rehab this morning and reported to staff he still had chest pressure. Pt was then transported to ED by staff.    (Consider location/radiation/quality/duration/timing/severity/associated sxs/prior treatment) Patient is a 66 y.o. male presenting with chest pain. The history is provided by the patient.  Chest Pain The chest pain began 3 - 5 days ago (He went to cardiac rehab this morning and reported chest discomfort. He was sent to ED for evaluation.). Chest pain occurs intermittently. The quality of the pain is described as sharp. The pain radiates to the left arm. Exacerbated by: He cannot identify anything that makes the pain better or worse. He reports that it woke him through the night but also that he exercised today on his bike without recurrent pain. Primary symptoms include vomiting. Pertinent negatives for primary symptoms include no fever. Primary symptoms comment: She reports going to her doctor today and being sent here for an elevated heart rate as well as evaluation of chest pain.  Pertinent negatives for associated symptoms include no lower extremity edema. He tried nothing for the symptoms. Risk factors: He had a similar pain 2 weeks ago and underwent catheterization with placement of one stent.   His past medical history is significant for hyperlipidemia and hypertension.  Procedure history is positive for cardiac catheterization.     Past Medical History  Diagnosis Date  . Hyperlipidemia   . BPH (benign prostatic hyperplasia)   . Arthritis   . CAD (coronary artery disease)     Diagnosed 12/2011 following abnormal treadmill test - high grade OM stenosis s/p DES 01/05/12, mild residual non-obstructive LAD, Lcx, RCA disease.  Normal LV  function with EF 55-65% by cath 01/05/12    Past Surgical History  Procedure Date  . Knee arthroscopy     bilateral  . Appendectomy   . Tonsillectomy and adenoidectomy   . Nasal septum surgery   . Femur surgery     Family History  Problem Relation Age of Onset  . Coronary artery disease Father     "Hardening of the arteries"    History  Substance Use Topics  . Smoking status: Never Smoker   . Smokeless tobacco: Never Used  . Alcohol Use: 1.2 oz/week    2 Glasses of wine per week      Review of Systems  Constitutional: Negative for fever and chills.  HENT: Negative.   Eyes: Negative.   Respiratory: Negative.   Cardiovascular: Positive for chest pain.  Gastrointestinal: Positive for vomiting.  Musculoskeletal: Negative.   Skin: Negative.   Neurological: Negative.     Allergies  Review of patient's allergies indicates no known allergies.  Home Medications   Current Outpatient Rx  Name Route Sig Dispense Refill  . ACETAMINOPHEN 500 MG PO TABS Oral Take 1,000 mg by mouth every 6 (six) hours as needed. Pain    . ASPIRIN 81 MG PO TABS Oral Take 81 mg by mouth daily.     . ATORVASTATIN CALCIUM 20 MG PO TABS Oral Take 20 mg by mouth at bedtime.    . CLOPIDOGREL BISULFATE 75 MG PO TABS Oral Take 75 mg by mouth daily with breakfast.    . FLUTICASONE PROPIONATE  50 MCG/ACT NA SUSP Nasal Place 2 sprays into the nose daily.    Marland Kitchen METOPROLOL SUCCINATE ER 25 MG PO TB24 Oral Take 25 mg by mouth daily.    Marland Kitchen PANTOPRAZOLE SODIUM 40 MG PO TBEC Oral Take 40 mg by mouth daily.    Marland Kitchen NITROGLYCERIN 0.4 MG SL SUBL Sublingual Place 0.4 mg under the tongue every 5 (five) minutes as needed. Up to 3 doses      BP 127/79  Pulse 83  Temp(Src) 98 F (36.7 C) (Oral)  Resp 22  SpO2 100%  Physical Exam  Constitutional: He appears well-developed and well-nourished.  HENT:  Head: Normocephalic.  Neck: Normal range of motion. Neck supple.  Cardiovascular: Normal rate and regular rhythm.     Pulmonary/Chest: Effort normal and breath sounds normal.  Abdominal: Soft. Bowel sounds are normal. There is no tenderness. There is no rebound and no guarding.  Musculoskeletal: Normal range of motion.  Neurological: He is alert. No cranial nerve deficit.  Skin: Skin is warm and dry. No rash noted.  Psychiatric: He has a normal mood and affect.    ED Course  Procedures (including critical care time)  Labs Reviewed  CBC - Abnormal; Notable for the following:    RBC 4.18 (*)    HCT 38.9 (*)    All other components within normal limits  DIFFERENTIAL  POCT I-STAT, CHEM 8  POCT I-STAT TROPONIN I   Dg Chest Portable 1 View  01/21/2012  *RADIOLOGY REPORT*  Clinical Data: Chest pain  PORTABLE CHEST - 1 VIEW  Comparison: None.  Findings: Normal mediastinum and heart silhouette.  The ascending aorta is prominent.  No effusion, infiltrate, or pneumothorax.  IMPRESSION:  1.  No acute cardiopulmonary process.  2.  Mildly prominent ascending aorta. Correlate clinically with aortic stenosis.  Original Report Authenticated By: Genevive Bi, M.D.     No diagnosis found.    MDM          Rodena Medin, PA-C 01/21/12 1501

## 2012-01-21 NOTE — ED Provider Notes (Signed)
Medical screening examination/treatment/procedure(s) were conducted as a shared visit with non-physician practitioner(s) and myself.  I personally evaluated the patient during the encounter  Flint Melter, MD 01/21/12 1946

## 2012-01-21 NOTE — Progress Notes (Signed)
Peter Morgan 66 y.o. male       Nutrition Screen                                                                    YES  NO Do you live in a nursing home?  X   Do you eat out more than 3 times/week?    X If yes, how many times per week do you eat out?  Do you have food allergies?   X If yes, what are you allergic to?  Have you gained or lost more than 10 lbs without trying?               X If yes, how much weight have you lost and over what time period?  lbs gained or lost over  weeks/month  Do you want to lose weight?     X If yes, what is a goal weight or amount of weight you would like to lose?  lb  Do you eat alone most of the time?   X   Do you eat less than 2 meals/day?  X If yes, how many meals do you eat?  Do you drink more than 3 alcohol drinks/day?  X If yes, how many drinks per day?  Are you having trouble with constipation? *  X If yes, what are you doing to help relieve constipation?  Do you have financial difficulties with buying food?*   X    Are you experiencing regular nausea/ vomiting?*     X   Do you have a poor appetite? *                                        X   Do you have trouble chewing/swallowing? *   X    Pt with diagnoses of:  X Stent/ PTCA X GERD          X Dyslipidemia  / HDL< 40 / LDL>70 / High TG      X %  Body fat >goal / Body Mass Index >25       Pt Risk Score   5       Diagnosis Risk Score  20       Total Risk Score   25                         High Risk               X Low Risk    HT: 69" Ht Readings from Last 1 Encounters:  01/20/12 5\' 9"  (1.753 m)    WT:   176.2 lb (80.1 kg) Wt Readings from Last 3 Encounters:  01/20/12 180 lb (81.647 kg)  01/15/12 176 lb 9.4 oz (80.1 kg)  01/05/12 174 lb (78.926 kg)     IBW 72.7 110%IBW BMI 26.1 27.4%body fat  Meds reviewed: reviewed Past Medical History  Diagnosis Date  . Hyperlipidemia   . BPH (benign prostatic hyperplasia)   . Arthritis   . CAD (coronary artery disease)     Diagnosed  12/2011 following abnormal treadmill test -  high grade OM stenosis s/p DES 01/05/12, mild residual non-obstructive LAD, Lcx, RCA disease.  Normal LV function with EF 55-65% by cath 01/05/12        Activity level: Pt is active  Wt goal: 176 lb ( 80.1 kg) Current tobacco use? No      Food/Drug Interaction? No       Labs:  Lipid Panel  No results found for this basename: chol, trig, hdl, cholhdl, vldl, ldlcalc   No results found for this basename: HGBA1C   01/06/12 Glucose 102  LDL goal: < 100      MI, DM, Carotid or PVD and > 2:      family h/o, > 67 yo male,  Estimated Daily Nutrition Needs for: ? wt maintenance  2350-2650 Kcal , Total Fat 75-85gm, Saturated Fat 18-20 gm, Trans Fat 2.6-3.0 gm,  Sodium less than 1500 mg

## 2012-01-22 ENCOUNTER — Telehealth (HOSPITAL_COMMUNITY): Payer: Self-pay | Admitting: *Deleted

## 2012-01-23 ENCOUNTER — Encounter (HOSPITAL_COMMUNITY)
Admission: RE | Admit: 2012-01-23 | Discharge: 2012-01-23 | Disposition: A | Discharge status: Home / Self Care | Payer: 59 | Source: Ambulatory Visit | Attending: Cardiology | Admitting: Cardiology

## 2012-01-26 ENCOUNTER — Encounter (HOSPITAL_COMMUNITY)
Admission: RE | Admit: 2012-01-26 | Discharge: 2012-01-26 | Disposition: A | Discharge status: Home / Self Care | Payer: 59 | Source: Ambulatory Visit | Attending: Cardiology | Admitting: Cardiology

## 2012-01-26 NOTE — Progress Notes (Signed)
Reviewed home exercise with pt today.  Pt plans to walk at park and use bike at home for exercise.  Pt was encouraged to either get a cell phone to carry while walking or walk in more populated areas. Reviewed THR, pulse (needs practice), RPE, sign and symptoms, NTG use (encouraged to carry with him at all times), and when to call 911 or MD.  Pt voiced understanding. Fabio Pierce, MA, ACSM RCEP Academic Intern Ovid Curd

## 2012-01-28 ENCOUNTER — Encounter (HOSPITAL_COMMUNITY)
Admission: RE | Admit: 2012-01-28 | Discharge: 2012-01-28 | Disposition: A | Discharge status: Home / Self Care | Payer: 59 | Source: Ambulatory Visit | Attending: Cardiology | Admitting: Cardiology

## 2012-01-30 ENCOUNTER — Encounter (HOSPITAL_COMMUNITY)
Admission: RE | Admit: 2012-01-30 | Discharge: 2012-01-30 | Disposition: A | Discharge status: Home / Self Care | Payer: 59 | Source: Ambulatory Visit | Attending: Cardiology | Admitting: Cardiology

## 2012-02-02 ENCOUNTER — Encounter (HOSPITAL_COMMUNITY)
Admission: RE | Admit: 2012-02-02 | Discharge: 2012-02-02 | Disposition: A | Discharge status: Home / Self Care | Payer: 59 | Source: Ambulatory Visit | Attending: Cardiology | Admitting: Cardiology

## 2012-02-04 ENCOUNTER — Encounter (HOSPITAL_COMMUNITY)
Admission: RE | Admit: 2012-02-04 | Discharge: 2012-02-04 | Disposition: A | Discharge status: Home / Self Care | Payer: 59 | Source: Ambulatory Visit | Attending: Cardiology | Admitting: Cardiology

## 2012-02-06 ENCOUNTER — Encounter (HOSPITAL_COMMUNITY)
Admission: RE | Admit: 2012-02-06 | Discharge: 2012-02-06 | Disposition: A | Discharge status: Home / Self Care | Payer: 59 | Source: Ambulatory Visit | Attending: Cardiology | Admitting: Cardiology

## 2012-02-06 NOTE — Progress Notes (Signed)
Peter Morgan 66 y.o. male Nutrition Note Spoke with pt.  Nutrition Plan and Nutrition Survey reviewed with pt. Pt is following Step 2 of the Therapeutic Lifestyle Changes diet.  Weight loss tips discussed per pt request. Pt expressed understanding. Nutrition Diagnosis   Food-and nutrition-related knowledge deficit related to lack of exposure to information as related to diagnosis of: ? CVD   Overweight related to excessive energy intake as evidenced by a BMI of 26.1 Nutrition RX/ Re-Estimated Daily Nutrition Needs for: wt loss per pt request 1350-1850 Kcal, 35-50 gm fat, 10-14 gm sat fat, 1.3-1.9 gm trans-fat, <1500 mg sodium  Nutrition Intervention   Pt's individual nutrition plan including cholesterol goals reviewed with pt.   Benefits of adopting Therapeutic Lifestyle Changes discussed when Medficts reviewed.   Pt to attend the Portion Distortion class   Pt to attend the  ? Nutrition I class                          ? Nutrition II class    Pt given handouts for: ? wt loss ? 5-day, 1500 kcal menu ideas    Continue client-centered nutrition education by RD, as part of interdisciplinary care. Goal(s)   Pt to identify food quantities necessary to achieve: ? wt loss to a goal wt of 152-164 lb (69.1-74.5 kg) at graduation from cardiac rehab.    Pt to describe the benefit of including fruits, vegetables, whole grains, and low-fat dairy products in a heart healthy meal plan. Monitor and Evaluate progress toward nutrition goal with team.

## 2012-02-09 ENCOUNTER — Encounter (HOSPITAL_COMMUNITY)
Admission: RE | Admit: 2012-02-09 | Discharge: 2012-02-09 | Disposition: A | Discharge status: Home / Self Care | Payer: 59 | Source: Ambulatory Visit | Attending: Cardiology | Admitting: Cardiology

## 2012-02-11 ENCOUNTER — Other Ambulatory Visit: Payer: Self-pay | Admitting: Physician Assistant

## 2012-02-11 ENCOUNTER — Encounter (HOSPITAL_COMMUNITY)
Admission: RE | Admit: 2012-02-11 | Discharge: 2012-02-11 | Disposition: A | Discharge status: Home / Self Care | Payer: 59 | Source: Ambulatory Visit | Attending: Cardiology | Admitting: Cardiology

## 2012-02-11 DIAGNOSIS — K219 Gastro-esophageal reflux disease without esophagitis: Secondary | ICD-10-CM

## 2012-02-11 MED ORDER — PANTOPRAZOLE SODIUM 40 MG PO TBEC: 40.0000 mg | DELAYED_RELEASE_TABLET | Freq: Every day | ORAL | Status: DC

## 2012-02-13 ENCOUNTER — Encounter (HOSPITAL_COMMUNITY)
Admission: RE | Admit: 2012-02-13 | Discharge: 2012-02-13 | Disposition: A | Discharge status: Home / Self Care | Payer: 59 | Source: Ambulatory Visit | Attending: Cardiology | Admitting: Cardiology

## 2012-02-13 DIAGNOSIS — I209 Angina pectoris, unspecified: Secondary | ICD-10-CM | POA: Insufficient documentation

## 2012-02-13 DIAGNOSIS — Z9861 Coronary angioplasty status: Secondary | ICD-10-CM | POA: Insufficient documentation

## 2012-02-13 DIAGNOSIS — I2510 Atherosclerotic heart disease of native coronary artery without angina pectoris: Secondary | ICD-10-CM | POA: Insufficient documentation

## 2012-02-13 DIAGNOSIS — E785 Hyperlipidemia, unspecified: Secondary | ICD-10-CM | POA: Insufficient documentation

## 2012-02-13 DIAGNOSIS — Z5189 Encounter for other specified aftercare: Secondary | ICD-10-CM | POA: Insufficient documentation

## 2012-02-16 ENCOUNTER — Encounter (HOSPITAL_COMMUNITY)
Admission: RE | Admit: 2012-02-16 | Discharge: 2012-02-16 | Disposition: A | Discharge status: Home / Self Care | Payer: 59 | Source: Ambulatory Visit | Attending: Cardiology | Admitting: Cardiology

## 2012-02-18 ENCOUNTER — Encounter (HOSPITAL_COMMUNITY)
Admission: RE | Admit: 2012-02-18 | Discharge: 2012-02-18 | Disposition: A | Discharge status: Home / Self Care | Payer: 59 | Source: Ambulatory Visit | Attending: Cardiology | Admitting: Cardiology

## 2012-02-20 ENCOUNTER — Encounter (HOSPITAL_COMMUNITY)
Admission: RE | Admit: 2012-02-20 | Discharge: 2012-02-20 | Disposition: A | Discharge status: Home / Self Care | Payer: 59 | Source: Ambulatory Visit | Attending: Cardiology | Admitting: Cardiology

## 2012-02-23 ENCOUNTER — Encounter (HOSPITAL_COMMUNITY)
Admission: RE | Admit: 2012-02-23 | Discharge: 2012-02-23 | Disposition: A | Discharge status: Home / Self Care | Payer: 59 | Source: Ambulatory Visit | Attending: Cardiology | Admitting: Cardiology

## 2012-02-25 ENCOUNTER — Encounter (HOSPITAL_COMMUNITY)
Admission: RE | Admit: 2012-02-25 | Discharge: 2012-02-25 | Disposition: A | Discharge status: Home / Self Care | Payer: 59 | Source: Ambulatory Visit | Attending: Cardiology | Admitting: Cardiology

## 2012-02-27 ENCOUNTER — Encounter (HOSPITAL_COMMUNITY): Payer: 59

## 2012-03-01 ENCOUNTER — Encounter (HOSPITAL_COMMUNITY)
Admission: RE | Admit: 2012-03-01 | Discharge: 2012-03-01 | Disposition: A | Discharge status: Home / Self Care | Payer: 59 | Source: Ambulatory Visit | Attending: Cardiology | Admitting: Cardiology

## 2012-03-03 ENCOUNTER — Encounter (HOSPITAL_COMMUNITY)
Admission: RE | Admit: 2012-03-03 | Discharge: 2012-03-03 | Disposition: A | Discharge status: Home / Self Care | Payer: 59 | Source: Ambulatory Visit | Attending: Cardiology | Admitting: Cardiology

## 2012-03-05 ENCOUNTER — Encounter (HOSPITAL_COMMUNITY)
Admission: RE | Admit: 2012-03-05 | Discharge: 2012-03-05 | Disposition: A | Discharge status: Home / Self Care | Payer: 59 | Source: Ambulatory Visit | Attending: Cardiology | Admitting: Cardiology

## 2012-03-05 NOTE — Progress Notes (Signed)
Patient complained of feeling lightheaded post exercise this morning. Blood pressure 92/60.  Patient given Gatorade after rest symptoms resolved.  Peter Morgan admits to working harder than usual on his last two stations.  Repeat sitting blood pressure 108/60 standing blood pressure 104/60.  Heart rate 67.Peter Morgan left cardiac rehab without symptoms or complaints.

## 2012-03-08 ENCOUNTER — Encounter (HOSPITAL_COMMUNITY)
Admission: RE | Admit: 2012-03-08 | Discharge: 2012-03-08 | Disposition: A | Discharge status: Home / Self Care | Payer: 59 | Source: Ambulatory Visit | Attending: Cardiology | Admitting: Cardiology

## 2012-03-08 NOTE — Progress Notes (Signed)
Peter Morgan has a follow up appointment with Dr Antoine Poche tomorrow.  Will send exercise flow sheets for review via Careers information officer.

## 2012-03-09 ENCOUNTER — Encounter: Payer: Self-pay | Admitting: Cardiology

## 2012-03-09 ENCOUNTER — Ambulatory Visit (INDEPENDENT_AMBULATORY_CARE_PROVIDER_SITE_OTHER): Payer: 59 | Admitting: Cardiology

## 2012-03-09 VITALS — BP 120/75 | HR 60 | Ht 69.0 in | Wt 174.0 lb

## 2012-03-09 DIAGNOSIS — E785 Hyperlipidemia, unspecified: Secondary | ICD-10-CM

## 2012-03-09 DIAGNOSIS — I2510 Atherosclerotic heart disease of native coronary artery without angina pectoris: Secondary | ICD-10-CM

## 2012-03-09 NOTE — Progress Notes (Signed)
   HPI The patient presents for follow up after a DES to his OM late last year.  Since I last saw him he has done well.  The patient denies any new symptoms such as chest discomfort, neck or arm discomfort. There has been no new shortness of breath, PND or orthopnea. There have been no reported palpitations, presyncope or syncope.  He is participating in cardiac rehab.  He does have some knee pain which bothers him.  We discussed treatment with Tylenol or etodolac.   No Known Allergies  Current Outpatient Prescriptions  Medication Sig Dispense Refill  . acetaminophen (TYLENOL) 500 MG tablet Take 1,000 mg by mouth every 6 (six) hours as needed. Pain      . aspirin 81 MG tablet Take 81 mg by mouth daily.       Marland Kitchen atorvastatin (LIPITOR) 20 MG tablet Take 20 mg by mouth at bedtime.      . clopidogrel (PLAVIX) 75 MG tablet Take 75 mg by mouth daily with breakfast.      . fluticasone (FLONASE) 50 MCG/ACT nasal spray Place 2 sprays into the nose daily.      . metoprolol succinate (TOPROL-XL) 25 MG 24 hr tablet Take 25 mg by mouth daily.      . nitroGLYCERIN (NITROSTAT) 0.4 MG SL tablet Place 0.4 mg under the tongue every 5 (five) minutes as needed. Up to 3 doses      . pantoprazole (PROTONIX) 40 MG tablet Take 1 tablet (40 mg total) by mouth daily.  30 tablet  2    Past Medical History  Diagnosis Date  . Hyperlipidemia   . BPH (benign prostatic hyperplasia)   . Arthritis   . CAD (coronary artery disease)     Diagnosed 12/2011 following abnormal treadmill test - high grade OM stenosis s/p DES 01/05/12, mild residual non-obstructive LAD, Lcx, RCA disease.  Normal LV function with EF 55-65% by cath 01/05/12    Past Surgical History  Procedure Date  . Knee arthroscopy     bilateral  . Appendectomy   . Tonsillectomy and adenoidectomy   . Nasal septum surgery   . Femur surgery     ROS:  Constipation.  Otherwise as stated in the HPI and negative for all other systems.  PHYSICAL EXAM There were  no vitals taken for this visit. GENERAL:  Well appearing NECK:  No jugular venous distention, waveform within normal limits, carotid upstroke brisk and symmetric, no bruits, no thyromegaly LYMPHATICS:  No cervical, inguinal adenopathy LUNGS:  Clear to auscultation bilaterally BACK:  No CVA tenderness CHEST:  Unremarkable HEART:  PMI not displaced or sustained,S1 and S2 within normal limits, no S3, no S4, no clicks, no rubs, no murmurs ABD:  Flat, positive bowel sounds normal in frequency in pitch, no bruits, no rebound, no guarding, no midline pulsatile mass, no hepatomegaly, no splenomegaly EXT:  2 plus pulses throughout, no edema, no cyanosis no clubbing   EKG:  Sinus rhythm, rate 68, nonspecific inferior T wave inversions, rightward axis, early transition in lead V2. 03/09/2012   ASSESSMENT AND PLAN

## 2012-03-09 NOTE — Assessment & Plan Note (Signed)
The patient has no new sypmtoms.  No further cardiovascular testing is indicated.  We will continue with aggressive risk reduction and meds as listed.  We discussed the risk benefits of etodolac. This is sometimes the only thing that gives him significant benefit from knee pain. He couldn't take this when necessary excepting a small risk of increased cardiovascular events.

## 2012-03-09 NOTE — Assessment & Plan Note (Addendum)
I will defer to Dr. Chilton Si with an LDL less than 100 and HDL greater than 40.

## 2012-03-09 NOTE — Patient Instructions (Signed)
The current medical regimen is effective;  continue present plan and medications.  Follow up in 1 year with Dr Hochrein.  You will receive a letter in the mail 2 months before you are due.  Please call us when you receive this letter to schedule your follow up appointment.  

## 2012-03-10 ENCOUNTER — Encounter (HOSPITAL_COMMUNITY)
Admission: RE | Admit: 2012-03-10 | Discharge: 2012-03-10 | Disposition: A | Discharge status: Home / Self Care | Payer: 59 | Source: Ambulatory Visit | Attending: Cardiology | Admitting: Cardiology

## 2012-03-12 ENCOUNTER — Encounter (HOSPITAL_COMMUNITY)
Admission: RE | Admit: 2012-03-12 | Discharge: 2012-03-12 | Disposition: A | Discharge status: Home / Self Care | Payer: 59 | Source: Ambulatory Visit | Attending: Cardiology | Admitting: Cardiology

## 2012-03-15 ENCOUNTER — Encounter (HOSPITAL_COMMUNITY)
Admission: RE | Admit: 2012-03-15 | Discharge: 2012-03-15 | Disposition: A | Discharge status: Home / Self Care | Payer: 59 | Source: Ambulatory Visit | Attending: Cardiology | Admitting: Cardiology

## 2012-03-15 DIAGNOSIS — I2510 Atherosclerotic heart disease of native coronary artery without angina pectoris: Secondary | ICD-10-CM | POA: Insufficient documentation

## 2012-03-15 DIAGNOSIS — Z9861 Coronary angioplasty status: Secondary | ICD-10-CM | POA: Insufficient documentation

## 2012-03-15 DIAGNOSIS — Z5189 Encounter for other specified aftercare: Secondary | ICD-10-CM | POA: Insufficient documentation

## 2012-03-15 DIAGNOSIS — I209 Angina pectoris, unspecified: Secondary | ICD-10-CM | POA: Insufficient documentation

## 2012-03-15 DIAGNOSIS — E785 Hyperlipidemia, unspecified: Secondary | ICD-10-CM | POA: Insufficient documentation

## 2012-03-17 ENCOUNTER — Encounter (HOSPITAL_COMMUNITY)
Admission: RE | Admit: 2012-03-17 | Discharge: 2012-03-17 | Disposition: A | Discharge status: Home / Self Care | Payer: 59 | Source: Ambulatory Visit | Attending: Cardiology | Admitting: Cardiology

## 2012-03-19 ENCOUNTER — Encounter (HOSPITAL_COMMUNITY)
Admission: RE | Admit: 2012-03-19 | Discharge: 2012-03-19 | Disposition: A | Discharge status: Home / Self Care | Payer: 59 | Source: Ambulatory Visit | Attending: Cardiology | Admitting: Cardiology

## 2012-03-22 ENCOUNTER — Encounter (HOSPITAL_COMMUNITY)
Admission: RE | Admit: 2012-03-22 | Discharge: 2012-03-22 | Disposition: A | Discharge status: Home / Self Care | Payer: 59 | Source: Ambulatory Visit | Attending: Cardiology | Admitting: Cardiology

## 2012-03-24 ENCOUNTER — Encounter (HOSPITAL_COMMUNITY)
Admission: RE | Admit: 2012-03-24 | Discharge: 2012-03-24 | Disposition: A | Discharge status: Home / Self Care | Payer: 59 | Source: Ambulatory Visit | Attending: Cardiology | Admitting: Cardiology

## 2012-03-26 ENCOUNTER — Encounter (HOSPITAL_COMMUNITY)
Admission: RE | Admit: 2012-03-26 | Discharge: 2012-03-26 | Disposition: A | Discharge status: Home / Self Care | Payer: 59 | Source: Ambulatory Visit | Attending: Cardiology | Admitting: Cardiology

## 2012-03-29 ENCOUNTER — Encounter (HOSPITAL_COMMUNITY)
Admission: RE | Admit: 2012-03-29 | Discharge: 2012-03-29 | Disposition: A | Discharge status: Home / Self Care | Payer: 59 | Source: Ambulatory Visit | Attending: Cardiology | Admitting: Cardiology

## 2012-03-29 NOTE — Progress Notes (Signed)
Nutrition Note Time spent: 15 minutes Spoke with pt re: updated cholesterol values pt brought in. 02/19/12 Labs reviewed and include: Total Cholesterol  131 TG    63 HDL    50 Total Chol/HDL Ratio 2.6 VLDL (calculated) 13 LDL (calculated) 68  Pt cholesterol values reviewed and discussed with pt. Pt interested in possibly/ eventually coming off of statin medication due to knee pain. This Clinical research associate encouraged pt to discuss possibly changing his statin medication to an alternative with Dr. Antoine Poche. This Clinical research associate discussed an anti-inflammatory diet that may help with chronic knee pain. Pt encouraged to discuss with his MD adding omega-3 fish oil supplements daily to current regimen. Pt expressed understanding of the above. Continue client-centered nutrition education by RD as part of interdisciplinary care.  Monitor and evaluate progress toward nutrition goal with team.

## 2012-03-31 ENCOUNTER — Encounter (HOSPITAL_COMMUNITY)
Admission: RE | Admit: 2012-03-31 | Discharge: 2012-03-31 | Disposition: A | Discharge status: Home / Self Care | Payer: 59 | Source: Ambulatory Visit | Attending: Cardiology | Admitting: Cardiology

## 2012-04-02 ENCOUNTER — Encounter (HOSPITAL_COMMUNITY)
Admission: RE | Admit: 2012-04-02 | Discharge: 2012-04-02 | Disposition: A | Discharge status: Home / Self Care | Payer: 59 | Source: Ambulatory Visit | Attending: Cardiology | Admitting: Cardiology

## 2012-04-05 ENCOUNTER — Encounter (HOSPITAL_COMMUNITY)
Admission: RE | Admit: 2012-04-05 | Discharge: 2012-04-05 | Disposition: A | Discharge status: Home / Self Care | Payer: 59 | Source: Ambulatory Visit | Attending: Cardiology | Admitting: Cardiology

## 2012-04-07 ENCOUNTER — Encounter (HOSPITAL_COMMUNITY)
Admission: RE | Admit: 2012-04-07 | Discharge: 2012-04-07 | Disposition: A | Discharge status: Home / Self Care | Payer: 59 | Source: Ambulatory Visit | Attending: Cardiology | Admitting: Cardiology

## 2012-04-09 ENCOUNTER — Encounter (HOSPITAL_COMMUNITY)
Admission: RE | Admit: 2012-04-09 | Discharge: 2012-04-09 | Disposition: A | Discharge status: Home / Self Care | Payer: 59 | Source: Ambulatory Visit | Attending: Cardiology | Admitting: Cardiology

## 2012-04-12 ENCOUNTER — Encounter (HOSPITAL_COMMUNITY)
Admission: RE | Admit: 2012-04-12 | Discharge: 2012-04-12 | Disposition: A | Discharge status: Home / Self Care | Payer: 59 | Source: Ambulatory Visit | Attending: Cardiology | Admitting: Cardiology

## 2012-04-14 ENCOUNTER — Encounter (HOSPITAL_COMMUNITY)
Admission: RE | Admit: 2012-04-14 | Discharge: 2012-04-14 | Disposition: A | Discharge status: Home / Self Care | Payer: 59 | Source: Ambulatory Visit | Attending: Cardiology | Admitting: Cardiology

## 2012-04-14 DIAGNOSIS — Z5189 Encounter for other specified aftercare: Secondary | ICD-10-CM | POA: Insufficient documentation

## 2012-04-14 DIAGNOSIS — I2510 Atherosclerotic heart disease of native coronary artery without angina pectoris: Secondary | ICD-10-CM | POA: Insufficient documentation

## 2012-04-14 DIAGNOSIS — Z9861 Coronary angioplasty status: Secondary | ICD-10-CM | POA: Insufficient documentation

## 2012-04-14 DIAGNOSIS — I209 Angina pectoris, unspecified: Secondary | ICD-10-CM | POA: Insufficient documentation

## 2012-04-14 DIAGNOSIS — E785 Hyperlipidemia, unspecified: Secondary | ICD-10-CM | POA: Insufficient documentation

## 2012-04-14 NOTE — Progress Notes (Signed)
Peter Morgan graduates today.  He plans to continue exercise on his own by walking.

## 2012-04-16 ENCOUNTER — Encounter (HOSPITAL_COMMUNITY): Payer: 59

## 2012-04-19 ENCOUNTER — Encounter (HOSPITAL_COMMUNITY): Payer: 59

## 2012-04-21 ENCOUNTER — Encounter (HOSPITAL_COMMUNITY): Payer: 59

## 2012-04-23 ENCOUNTER — Encounter (HOSPITAL_COMMUNITY): Payer: 59

## 2012-04-30 NOTE — Progress Notes (Signed)
Cardiac Rehabilitation Program Outcomes Report   Orientation:  01/15/2012 Graduate Date:  04/14/2012  # of sessions completed: 36/36  Cardiologist: Antoine Poche Family MD:  Tommie Ard Time:  0945  A.  Exercise Program:  Tolerates exercise @ 6.0 METS for 30 minutes, Bike Test Results:  Pre: 1.03 miles and Post: 1.27 miles, Improved functional capacity  23.3 %, Improved  muscular strength  22 %, Improved  flexibility 29 %, Improved education score 33 % and Discharged to home exercise program.  Anticipated compliance:  excellent  B.  Mental Health:  Good mental attitude and Quality of Life (QOL)  improvements:  Overall  4.2 %, Health/Functioning 5.6 %, Socioeconomics 5.3 %, Psych/Spiritual 3.0 %, Family 0 %    C.  Education/Instruction/Skills  Accurately checks own pulse.  Rest:  73  Exercise:  122, Knows THR for exercise, Uses Perceived Exertion Scale and/or Dyspnea Scale and Attended 8/13 education classes  Home exercise given: 01/26/2012  D.  Nutrition/Weight Control/Body Composition:  Pt following a step 2 Therapeutic Lifestyle Changes diet. Pt with 18.3% decrease in %body fat. Pt wt is down 2.5 kg. BMI 25.3 and % body fat 22.4%. Section Completed by: Mickle Plumb, M.Ed, RD, LDN, CDE 05/07/12 1341  E.  Blood Lipids  02/19/12  Total Cholesterol 131 TG 63 HDL-C 50 LDL-C 68 Section Completed by: Mickle Plumb, M.Ed, RD, LDN, CDE 05/07/12 1349  F.  Lifestyle Changes:  Making positive lifestyle changes  G.  Symptoms noted with exercise:  Angina at rest on 2/6 taken to ED and diagnosed with chest wall pain  Report Completed By:  Fabio Pierce, MA, ACSM RCEP    Comments:  Pt did well in program progressing from 2.9 METs to 6.0 METs.  Pt plans to continue to exercise by walking, biking, and kayaking at home.  At d/c pt was in normal sinus.  Thanks for the referral. Fabio Pierce, MA, ACSM RCEP. Agree with progress report from cardiac rehab written by Trevor Mace and and Heloise Purpura

## 2012-05-21 ENCOUNTER — Other Ambulatory Visit: Payer: Self-pay | Admitting: Physician Assistant

## 2012-06-24 ENCOUNTER — Telehealth: Payer: Self-pay | Admitting: Cardiology

## 2012-06-24 NOTE — Telephone Encounter (Signed)
Pt needing to know if he would be OK to have surgery.  Dr Leslee Home was taking about doing a knee replacement but doesn't know if pt would be cleared from a cardiac standpoint.  Scheduled to see MD on Friday am.  Will forward to MD for review

## 2012-06-24 NOTE — Telephone Encounter (Signed)
New msg Pt wants to talk to you about stent and possibly getting knee surgery. Please call

## 2012-06-29 NOTE — Telephone Encounter (Signed)
Spoke with pt who understands that he needs to avoid coming off Plavix for 1 year after him stent placement if at all possible.  He is having a gel injection for now and is not ready for surgery at this point.  He will call back if necessary.

## 2012-06-29 NOTE — Telephone Encounter (Signed)
I would not like for him to come off Plavix for one year for an elective surgery.

## 2012-08-01 IMAGING — CR DG CHEST 1V PORT
1 series · 1 of 1 positions shown · non-contrast
Comparison: None.

CLINICAL DATA: Chest pain

PORTABLE CHEST - 1 VIEW

[view not recorded]
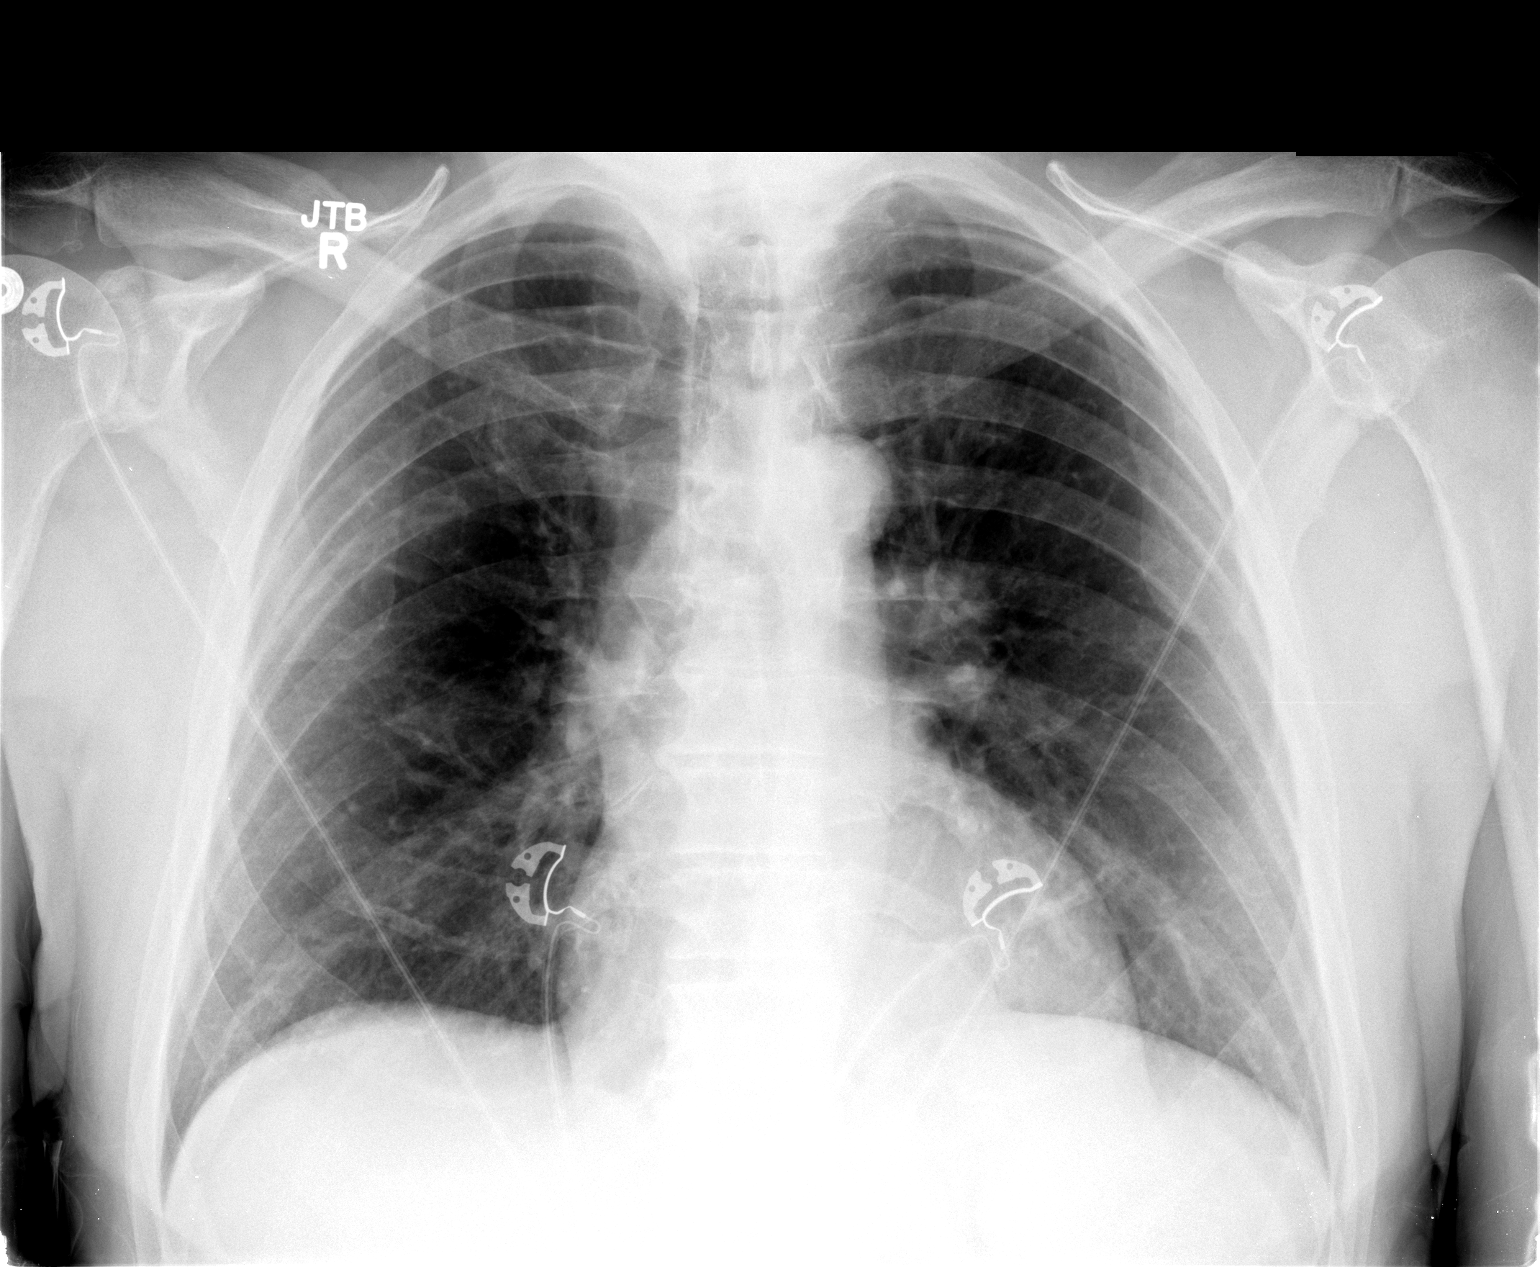

[1 of 1 positions shown; findings below may reference images not displayed]

FINDINGS: Normal mediastinum and heart silhouette.  The ascending
aorta is prominent.  No effusion, infiltrate, or pneumothorax.
IMPRESSION: 1..  No acute cardiopulmonary process.

2.  Mildly prominent ascending aorta. Correlate clinically with
aortic stenosis.

## 2012-08-03 ENCOUNTER — Other Ambulatory Visit: Payer: Self-pay | Admitting: Cardiology

## 2012-08-03 MED ORDER — ATORVASTATIN CALCIUM 20 MG PO TABS: 20.0000 mg | ORAL_TABLET | Freq: Every day | ORAL | Status: DC

## 2012-08-03 MED ORDER — CLOPIDOGREL BISULFATE 75 MG PO TABS: 75.0000 mg | ORAL_TABLET | Freq: Every day | ORAL | Status: DC

## 2012-08-03 NOTE — Telephone Encounter (Signed)
Fax Received. Refill Completed. Peter Morgan (R.M.A)   

## 2013-02-18 ENCOUNTER — Ambulatory Visit: Payer: 59 | Admitting: Cardiology

## 2013-02-21 ENCOUNTER — Ambulatory Visit (INDEPENDENT_AMBULATORY_CARE_PROVIDER_SITE_OTHER): Payer: 59 | Admitting: Cardiology

## 2013-02-21 ENCOUNTER — Encounter: Payer: Self-pay | Admitting: Cardiology

## 2013-02-21 VITALS — BP 124/80 | HR 63 | Ht 69.0 in | Wt 179.0 lb

## 2013-02-21 DIAGNOSIS — I2510 Atherosclerotic heart disease of native coronary artery without angina pectoris: Secondary | ICD-10-CM

## 2013-02-21 DIAGNOSIS — E7800 Pure hypercholesterolemia, unspecified: Secondary | ICD-10-CM

## 2013-02-21 NOTE — Progress Notes (Signed)
HPI The patient presents for follow up after a DES to his OM. He had nonobstructive plaque elsewhere..  Since I last saw him he has done well.  The patient denies any new symptoms such as chest discomfort, neck or arm discomfort. There has been no new shortness of breath, PND or orthopnea. There have been no reported palpitations, presyncope or syncope.   He exercises routinely but he is having increasing problems with left knee pain.   No Known Allergies  Current Outpatient Prescriptions  Medication Sig Dispense Refill  . acetaminophen (TYLENOL) 500 MG tablet Take 1,000 mg by mouth every 6 (six) hours as needed. Pain      . aspirin 81 MG tablet Take 81 mg by mouth daily.       Marland Kitchen atorvastatin (LIPITOR) 20 MG tablet Take 1 tablet (20 mg total) by mouth at bedtime.  30 tablet  5  . clopidogrel (PLAVIX) 75 MG tablet Take 1 tablet (75 mg total) by mouth daily with breakfast.  30 tablet  5  . ETODOLAC PO Take 1 tablet by mouth daily.      . fluticasone (FLONASE) 50 MCG/ACT nasal spray Place 2 sprays into the nose daily.      . metoprolol succinate (TOPROL-XL) 25 MG 24 hr tablet Take 25 mg by mouth daily.      . nitroGLYCERIN (NITROSTAT) 0.4 MG SL tablet Place 0.4 mg under the tongue every 5 (five) minutes as needed. Up to 3 doses      . pantoprazole (PROTONIX) 40 MG tablet TAKE 1 TABLET BY MOUTH EVERY DAY  30 tablet  6   No current facility-administered medications for this visit.    Past Medical History  Diagnosis Date  . Hyperlipidemia   . BPH (benign prostatic hyperplasia)   . Arthritis   . CAD (coronary artery disease)     Diagnosed 12/2011 following abnormal treadmill test - high grade OM stenosis s/p DES 01/05/12, mild residual non-obstructive LAD, Lcx, RCA disease.  Normal LV function with EF 55-65% by cath 01/05/12    Past Surgical History  Procedure Laterality Date  . Knee arthroscopy      bilateral  . Appendectomy    . Tonsillectomy and adenoidectomy    . Nasal septum  surgery    . Femur surgery      ROS:  As stated in the HPI and negative for all other systems.  PHYSICAL EXAM BP 124/80  Pulse 63  Ht 5\' 9"  (1.753 m)  Wt 179 lb (81.194 kg)  BMI 26.42 kg/m2 GENERAL:  Well appearing NECK:  No jugular venous distention, waveform within normal limits, carotid upstroke brisk and symmetric, no bruits, no thyromegaly LUNGS:  Clear to auscultation bilaterally BACK:  No CVA tenderness CHEST:  Unremarkable HEART:  PMI not displaced or sustained,S1 and S2 within normal limits, no S3, no S4, no clicks, no rubs, no murmurs ABD:  Flat, positive bowel sounds normal in frequency in pitch, no bruits, no rebound, no guarding, no midline pulsatile mass, no hepatomegaly, no splenomegaly EXT:  2 plus pulses throughout, no edema, no cyanosis no clubbing   EKG:  Sinus rhythm, rate 63, nonspecific inferior T wave inversions, rightward axis, early transition in lead V2. 02/21/2013   ASSESSMENT AND PLAN  CAD:  The patient has no new sypmtoms.  No further cardiovascular testing is indicated.  We will continue with aggressive risk reduction and meds as listed.  HYPERLIPIDEMIA:  According to recent guidelines a more appropriate dose of  statin might be 40-80 mg of Lipitor. However, if he already has a very good lipid level I would probably leave him on 20 mg. He will come back for fasting lipid profile.

## 2013-02-21 NOTE — Patient Instructions (Addendum)
The current medical regimen is effective;  continue present plan and medications.  Please return fasting for lipid profile.  Follow up in 1 year with Dr Antoine Poche.  You will receive a letter in the mail 2 months before you are due.  Please call us when you receive this letter to schedule your follow up appointment.

## 2013-02-24 ENCOUNTER — Other Ambulatory Visit (INDEPENDENT_AMBULATORY_CARE_PROVIDER_SITE_OTHER): Payer: 59

## 2013-02-24 DIAGNOSIS — E7800 Pure hypercholesterolemia, unspecified: Secondary | ICD-10-CM

## 2013-02-24 LAB — LIPID PANEL
Cholesterol: 157 mg/dL (ref 0–200)
HDL: 59.8 mg/dL
LDL Cholesterol: 79 mg/dL (ref 0–99)
Total CHOL/HDL Ratio: 3
Triglycerides: 90 mg/dL (ref 0.0–149.0)
VLDL: 18 mg/dL (ref 0.0–40.0)

## 2013-06-26 ENCOUNTER — Other Ambulatory Visit: Payer: Self-pay | Admitting: Orthopedic Surgery

## 2013-06-26 MED ORDER — BUPIVACAINE LIPOSOME 1.3 % IJ SUSP: 20.0000 mL | Freq: Once | INTRAMUSCULAR | Status: DC

## 2013-06-26 MED ORDER — DEXAMETHASONE SODIUM PHOSPHATE 10 MG/ML IJ SOLN: 10.0000 mg | Freq: Once | INTRAMUSCULAR | Status: DC

## 2013-06-26 NOTE — Progress Notes (Signed)
Preoperative surgical orders have been place into the Epic hospital system for Peter Morgan on 06/26/2013, 11:10 PM  by Patrica Duel for surgery on 07/11/13.  Preop Total Knee orders including Experal, PO Tylenol, and IV Decadron as long as there are no contraindications to the above medications. Avel Peace, PA-C

## 2013-07-04 ENCOUNTER — Encounter (HOSPITAL_COMMUNITY): Payer: Self-pay | Admitting: Pharmacy Technician

## 2013-07-05 NOTE — Patient Instructions (Signed)
Peter Morgan  07/05/2013   Your procedure is scheduled on:  07/11/13              Surgery 0820am-0910am  Report to [facility] at    0520  AM.  Call this number if you have problems the morning of surgery: [phone]   Remember:   Do not eat food or drink liquids after midnight.   Take these medicines the morning of surgery with A SIP OF WATER:    Do not wear jewelry,   Do not wear lotions, powders, or perfumes.   . Men may shave face and neck.  Do not bring valuables to the hospital.  Contacts, dentures or bridgework may not be worn into surgery.  Leave suitcase in the car. After surgery it may be brought to your room.  For patients admitted to the hospital, checkout time is 11:00 AM the day of  discharge.    SEE CHG INSTRUCTION SHEET    Please read over the following fact sheets that you were given: MRSA Information, coughing and deep breathing exercises, leg exercises, Blood Transfusion Fact Sheet , Incentive Spirometry Fact Sheet                Failure to comply with these instructions may result in cancellation of your surgery.                Patient Signature ____________________________              Nurse Signature _____________________________

## 2013-07-06 ENCOUNTER — Encounter (HOSPITAL_COMMUNITY)
Admission: RE | Admit: 2013-07-06 | Discharge: 2013-07-06 | Disposition: A | Discharge status: Home / Self Care | Payer: Medicare Other | Source: Ambulatory Visit | Attending: Orthopedic Surgery | Admitting: Orthopedic Surgery

## 2013-07-06 ENCOUNTER — Encounter (HOSPITAL_COMMUNITY): Payer: Self-pay

## 2013-07-06 ENCOUNTER — Ambulatory Visit (HOSPITAL_COMMUNITY)
Admission: RE | Admit: 2013-07-06 | Discharge: 2013-07-06 | Disposition: A | Discharge status: Home / Self Care | Payer: Medicare Other | Source: Ambulatory Visit | Attending: Orthopedic Surgery | Admitting: Orthopedic Surgery

## 2013-07-06 DIAGNOSIS — Z01812 Encounter for preprocedural laboratory examination: Secondary | ICD-10-CM | POA: Insufficient documentation

## 2013-07-06 DIAGNOSIS — I10 Essential (primary) hypertension: Secondary | ICD-10-CM | POA: Insufficient documentation

## 2013-07-06 DIAGNOSIS — Z01818 Encounter for other preprocedural examination: Secondary | ICD-10-CM | POA: Insufficient documentation

## 2013-07-06 HISTORY — DX: Gastro-esophageal reflux disease without esophagitis: K21.9

## 2013-07-06 HISTORY — DX: Essential (primary) hypertension: I10

## 2013-07-06 LAB — COMPREHENSIVE METABOLIC PANEL
Albumin: 3.7 g/dL (ref 3.5–5.2)
BUN: 15 mg/dL (ref 6–23)
Calcium: 9.9 mg/dL (ref 8.4–10.5)
GFR calc Af Amer: >90 mL/min (ref >90)
Glucose, Bld: 96 mg/dL (ref 70–99)
Sodium: 134 mEq/L — ABNORMAL LOW (ref 135–145)
Total Protein: 7 g/dL (ref 6.0–8.3)

## 2013-07-06 LAB — URINALYSIS, ROUTINE W REFLEX MICROSCOPIC
Bilirubin Urine: NEGATIVE
Glucose, UA: NEGATIVE mg/dL
Hgb urine dipstick: NEGATIVE
Ketones, ur: NEGATIVE mg/dL
Leukocytes, UA: NEGATIVE
pH: 6 (ref 5.0–8.0)

## 2013-07-06 LAB — PROTIME-INR: Prothrombin Time: 12.2 seconds (ref 11.6–15.2)

## 2013-07-06 LAB — CBC
HCT: 39.7 % (ref 39.0–52.0)
Hemoglobin: 13.1 g/dL (ref 13.0–17.0)
MCH: 32.1 pg (ref 26.0–34.0)
MCHC: 33 g/dL (ref 30.0–36.0)
RDW: 13 % (ref 11.5–15.5)

## 2013-07-06 LAB — ABO/RH: ABO/RH(D): A POS

## 2013-07-06 NOTE — Progress Notes (Signed)
ECHO 12/2011 EP[facility]  STRESS 12/2011 EPIC  Dr Antoine Poche Last office visit 02/2012 [facility]  EKG 02/21/13 [facility]

## 2013-07-06 NOTE — Progress Notes (Signed)
CBC results faxed via EPIC to Dr Aluisio.  

## 2013-07-08 ENCOUNTER — Other Ambulatory Visit: Payer: Self-pay | Admitting: Orthopedic Surgery

## 2013-07-08 MED ORDER — BUPIVACAINE LIPOSOME 1.3 % IJ SUSP: 20.0000 mL | Freq: Once | INTRAMUSCULAR | Status: DC

## 2013-07-08 NOTE — H&P (Signed)
Peter Morgan  DOB: 04/20/1946 Married / Language: English / Race: White Male  Date of Admission:  07/11/2013  Chief Complaint:  Left Knee Pain  History of Present Illness The patient is a 67 year old male who comes in for a preoperative History and Physical. The patient is scheduled for a left total knee arthroplasty to be performed by Dr. Frank V. Aluisio, MD on 07/11/2013. The patient is a 67 year old male who presents for follow up of their knee. The patient is being followed for their bilateral knee pain and osteoarthritis. They are now 5 week(s) out from cortisone injections. Symptoms reported today include: pain and swelling. The patient feels that they are doing well and report their pain level to be mild to moderate. Current treatment includes: NSAIDs (aspirin) and ice and heat. The patient has reported improvement of their symptoms with: Cortisone injections. The patient states that the left knee does bother him a lot more than the right. He said the cortisone helped some with the patient, but he is still having a lot of dysfunction, especially in that left knee. He likes to remain extremely active. He does a lot of wood working and work a round the house. He states that the knee is starting to prevent him from doing things he desires. He is at a stage where he would like to get something done in order to remain active. He feels like the knee is the only thing that is holding him back from doing everything he wants to. He is ready to proceed with surgery. They have been treated conservatively in the past for the above stated problem and despite conservative measures, they continue to have progressive pain and severe functional limitations and dysfunction. They have failed non-operative management including home exercise, medications, and injections. It is felt that they would benefit from undergoing total joint replacement. Risks and benefits of the procedure have been  discussed with the patient and they elect to proceed with surgery. There are no active contraindications to surgery such as ongoing infection or rapidly progressive neurological disease.   Problem List Primary osteoarthritis of both knees (715.16)   Allergies Codeine Derivatives. Nausea. TraMADol HCl *ANALGESICS - OPIOID*. Nausea.   Family History Congestive Heart Failure. father Heart Disease. father   Social History Living situation. live with spouse Marital status. married Illicit drug use. no Drug/Alcohol Rehab (Previously). no Exercise. Exercises daily; does running / walking and gym / weights Tobacco use. never smoker Number of flights of stairs before winded. greater than 5 Pain Contract. no Drug/Alcohol Rehab (Currently). no Children. 1 Current work status. working part time Alcohol use. current drinker; drinks wine; 5-7 per week Post-Surgical Plans. Home Advance Directives. Living Will, Healthcare POA   Medication History Aspirin EC (81MG Tablet DR, Oral) Active. Atorvastatin Calcium (20MG Tablet, Oral) Active. Clopidogrel Bisulfate (75MG Tablet, Oral) Active. Metoprolol Succinate ER (50MG Tablet ER 24HR, Oral) Active. (25mg) Pantoprazole Sodium (40MG Tablet DR, Oral) Active. Flomax (0.4MG Capsule, Oral every morning) Active.   Past Surgical History Straighten Nasal Septum Sinus Surgery Arthroscopy of Knee. bilateral Heart Stents. ONE (99% Blockage)   Medical History Coronary artery disease Urinary Frequency   Review of Systems General:Not Present- Chills, Fever, Night Sweats, Fatigue, Weight Gain, Weight Loss and Memory Loss. Skin:Not Present- Hives, Itching, Rash, Eczema and Lesions. HEENT:Not Present- Tinnitus, Headache, Double Vision, Visual Loss, Hearing Loss and Dentures. Respiratory:Not Present- Shortness of breath with exertion, Shortness of breath at rest, Allergies, Coughing up blood and   Chronic  Cough. Cardiovascular:Not Present- Chest Pain, Racing/skipping heartbeats, Difficulty Breathing Lying Down, Murmur, Swelling and Palpitations. Gastrointestinal:Not Present- Bloody Stool, Heartburn, Abdominal Pain, Vomiting, Nausea, Constipation, Diarrhea, Difficulty Swallowing, Jaundice and Loss of appetitie. Male Genitourinary:Present- Urinary frequency and Urinating at Night. Not Present- Blood in Urine, Weak urinary stream, Discharge, Flank Pain, Incontinence, Painful Urination, Urgency and Urinary Retention. Musculoskeletal:Present- Joint Pain and Morning Stiffness. Not Present- Muscle Pain, Joint Swelling, Back Pain and Spasms. Neurological:Not Present- Tremor, Dizziness, Blackout spells, Paralysis, Difficulty with balance and Weakness. Psychiatric:Not Present- Insomnia.   Vitals Weight: 172 lb Height: 70 in Weight was reported by patient. Body Surface Area: 1.96 m Body Mass Index: 24.68 kg/m Pulse: 76 (Regular) Resp.: 12 (Unlabored) BP: 108/68 (Sitting, Right Arm, Standard)    Physical Exam The physical exam findings are as follows:  Note: Patient is a 67 year old male with continued pain. Patient is accompanied today by his wife [name].   General Mental Status - Alert, cooperative and good historian. General Appearance- pleasant. Not in acute distress. Orientation- Oriented X3. Build & Nutrition- Well nourished and Well developed.   Head and Neck Head- normocephalic, atraumatic . Neck Global Assessment- supple. no bruit auscultated on the right and no bruit auscultated on the left.   Eye Pupil- Bilateral- Regular and Round. Motion- Bilateral- EOMI.   Chest and Lung Exam Auscultation: Breath sounds:- clear at anterior chest wall and - clear at posterior chest wall. Adventitious sounds:- No Adventitious sounds.   Cardiovascular Auscultation:Rhythm- Regular rate and rhythm. Heart Sounds- S1 WNL and S2 WNL. Murmurs & Other  Heart Sounds:Auscultation of the heart reveals - No Murmurs.   Abdomen Palpation/Percussion:Tenderness- Abdomen is non-tender to palpation. Rigidity (guarding)- Abdomen is soft. Auscultation:Auscultation of the abdomen reveals - Bowel sounds normal.   Male Genitourinary  Not done, not pertinent to present illness  Musculoskeletal On exam he is a well developed male, alert and oriented in no apparent distress. Both knees show no effusion. The right knee shows no deformity. Range of motion about 5 to 125. There is marked crepitus on range of motion with some slight tenderness medial greater than lateral with no instability noted. The left knee shows a varus deformity. No effusion. Range 5 to 135. Marked crepitus on range of motion. Tenderness medial greater than lateral with no instability noted. Pulse sensation and motor intact both lower extremities.  RADIOGRAPHS: AP both knees and lateral which show tricompartmental bone on bone arthritis in the right knee with no deformity. He also has a previous fracture of the right distal fibula with a lot of callus formation. His left knee shows bone on bone medial and patellofemoral with significant chondrocalcinosis laterally.  Assessment & Plan Primary osteoarthritis of both knees (715.16) Impression: Left Knee  Note: Plan is for a Left Total Knee Replacement by Dr. Aluisio.  Plan is to go home.  The patient will not receive TXA (tranexamic acid) due to: Coronary Arterial Disease  PCP - Dr. Ed Green - Patient has been seen preoperatively and felt to be stable for surgery. Cardiology - Dr. Hochrein - Patient has been seen preoperatively and felt to be stable for surgery.  Time spent: greater than 30 minutes.  Signed electronically by Smayan Hackbart L Laylana Gerwig, III PA-C  

## 2013-07-11 ENCOUNTER — Inpatient Hospital Stay (HOSPITAL_COMMUNITY)
Admission: RE | Admit: 2013-07-11 | Discharge: 2013-07-15 | DRG: 470 | Disposition: A | Discharge status: Home Health | Payer: Medicare Other | Source: Ambulatory Visit | Attending: Orthopedic Surgery | Admitting: Orthopedic Surgery

## 2013-07-11 ENCOUNTER — Inpatient Hospital Stay (HOSPITAL_COMMUNITY): Payer: Medicare Other | Admitting: Anesthesiology

## 2013-07-11 ENCOUNTER — Encounter (HOSPITAL_COMMUNITY)
Admission: RE | Disposition: A | Discharge status: Home Health | Payer: Self-pay | Source: Ambulatory Visit | Attending: Orthopedic Surgery

## 2013-07-11 ENCOUNTER — Encounter (HOSPITAL_COMMUNITY): Payer: Self-pay | Admitting: Anesthesiology

## 2013-07-11 DIAGNOSIS — I959 Hypotension, unspecified: Secondary | ICD-10-CM | POA: Diagnosis not present

## 2013-07-11 DIAGNOSIS — Z96652 Presence of left artificial knee joint: Secondary | ICD-10-CM

## 2013-07-11 DIAGNOSIS — Z7902 Long term (current) use of antithrombotics/antiplatelets: Secondary | ICD-10-CM

## 2013-07-11 DIAGNOSIS — I9589 Other hypotension: Secondary | ICD-10-CM | POA: Diagnosis not present

## 2013-07-11 DIAGNOSIS — E785 Hyperlipidemia, unspecified: Secondary | ICD-10-CM | POA: Diagnosis present

## 2013-07-11 DIAGNOSIS — I10 Essential (primary) hypertension: Secondary | ICD-10-CM | POA: Diagnosis present

## 2013-07-11 DIAGNOSIS — M1710 Unilateral primary osteoarthritis, unspecified knee: Principal | ICD-10-CM | POA: Diagnosis present

## 2013-07-11 DIAGNOSIS — D649 Anemia, unspecified: Secondary | ICD-10-CM

## 2013-07-11 DIAGNOSIS — R001 Bradycardia, unspecified: Secondary | ICD-10-CM | POA: Diagnosis not present

## 2013-07-11 DIAGNOSIS — Z9289 Personal history of other medical treatment: Secondary | ICD-10-CM

## 2013-07-11 DIAGNOSIS — N400 Enlarged prostate without lower urinary tract symptoms: Secondary | ICD-10-CM | POA: Diagnosis present

## 2013-07-11 DIAGNOSIS — K219 Gastro-esophageal reflux disease without esophagitis: Secondary | ICD-10-CM | POA: Diagnosis present

## 2013-07-11 DIAGNOSIS — D62 Acute posthemorrhagic anemia: Secondary | ICD-10-CM | POA: Diagnosis not present

## 2013-07-11 DIAGNOSIS — I498 Other specified cardiac arrhythmias: Secondary | ICD-10-CM | POA: Diagnosis not present

## 2013-07-11 DIAGNOSIS — M179 Osteoarthritis of knee, unspecified: Secondary | ICD-10-CM | POA: Diagnosis present

## 2013-07-11 DIAGNOSIS — I2510 Atherosclerotic heart disease of native coronary artery without angina pectoris: Secondary | ICD-10-CM | POA: Diagnosis present

## 2013-07-11 HISTORY — PX: TOTAL KNEE ARTHROPLASTY: SHX125

## 2013-07-11 LAB — GLUCOSE, CAPILLARY
Glucose-Capillary: 159 mg/dL — ABNORMAL HIGH (ref 70–99)
Glucose-Capillary: 234 mg/dL — ABNORMAL HIGH (ref 70–99)

## 2013-07-11 LAB — TROPONIN I: Troponin I: <0.3 ng/mL (ref <0.30)

## 2013-07-11 SURGERY — ARTHROPLASTY, KNEE, TOTAL
Anesthesia: Spinal | Site: Knee | Laterality: Left | Wound class: Clean

## 2013-07-11 MED ORDER — PANTOPRAZOLE SODIUM 40 MG PO TBEC
40.0000 mg | DELAYED_RELEASE_TABLET | Freq: Every day | ORAL | Status: DC
  Administered 2013-07-12 – 2013-07-15 (×4): 40 mg via ORAL
  Filled 2013-07-11 (×4): qty 1

## 2013-07-11 MED ORDER — CEFAZOLIN SODIUM-DEXTROSE 2-3 GM-% IV SOLR
2.0000 g | INTRAVENOUS | Status: AC
  Administered 2013-07-11: 2 g via INTRAVENOUS

## 2013-07-11 MED ORDER — SODIUM CHLORIDE 0.9 % IJ SOLN
INTRAMUSCULAR | Status: DC | PRN
  Administered 2013-07-11: 20 mL

## 2013-07-11 MED ORDER — BUPIVACAINE HCL 0.25 % IJ SOLN
INTRAMUSCULAR | Status: DC | PRN
  Administered 2013-07-11: 30 mL

## 2013-07-11 MED ORDER — TRAMADOL HCL 50 MG PO TABS
50.0000 mg | ORAL_TABLET | Freq: Four times a day (QID) | ORAL | Status: DC | PRN
  Filled 2013-07-11: qty 2

## 2013-07-11 MED ORDER — DEXAMETHASONE 6 MG PO TABS
10.0000 mg | ORAL_TABLET | Freq: Every day | ORAL | Status: AC
  Administered 2013-07-12: 10 mg via ORAL
  Filled 2013-07-11: qty 1

## 2013-07-11 MED ORDER — BISACODYL 10 MG RE SUPP: 10.0000 mg | Freq: Every day | RECTAL | Status: DC | PRN

## 2013-07-11 MED ORDER — METHOCARBAMOL 100 MG/ML IJ SOLN
500.0000 mg | Freq: Four times a day (QID) | INTRAVENOUS | Status: DC | PRN
  Administered 2013-07-11: 500 mg via INTRAVENOUS
  Filled 2013-07-11 (×2): qty 5

## 2013-07-11 MED ORDER — MENTHOL 3 MG MT LOZG
1.0000 | LOZENGE | OROMUCOSAL | Status: DC | PRN
  Administered 2013-07-11: 3 mg via ORAL
  Filled 2013-07-11 (×2): qty 9

## 2013-07-11 MED ORDER — FLEET ENEMA 7-19 GM/118ML RE ENEM: 1.0000 | ENEMA | Freq: Once | RECTAL | Status: AC | PRN

## 2013-07-11 MED ORDER — BUPIVACAINE LIPOSOME 1.3 % IJ SUSP
INTRAMUSCULAR | Status: DC | PRN
  Administered 2013-07-11: 20 mL

## 2013-07-11 MED ORDER — ONDANSETRON HCL 4 MG/2ML IJ SOLN
4.0000 mg | Freq: Four times a day (QID) | INTRAMUSCULAR | Status: DC | PRN
  Administered 2013-07-11 – 2013-07-12 (×4): 4 mg via INTRAVENOUS
  Filled 2013-07-11 (×4): qty 2

## 2013-07-11 MED ORDER — DEXTROSE-NACL 5-0.9 % IV SOLN
INTRAVENOUS | Status: DC
  Administered 2013-07-11: 11:00:00 via INTRAVENOUS
  Administered 2013-07-11 – 2013-07-12 (×2): 100 mL via INTRAVENOUS
  Administered 2013-07-13: 10:00:00 via INTRAVENOUS

## 2013-07-11 MED ORDER — METOPROLOL SUCCINATE ER 25 MG PO TB24
25.0000 mg | ORAL_TABLET | Freq: Every morning | ORAL | Status: DC
  Administered 2013-07-12 – 2013-07-15 (×4): 25 mg via ORAL
  Filled 2013-07-11 (×4): qty 1

## 2013-07-11 MED ORDER — CEFAZOLIN SODIUM-DEXTROSE 2-3 GM-% IV SOLR
INTRAVENOUS | Status: DC | PRN
  Administered 2013-07-11: 2 g via INTRAVENOUS

## 2013-07-11 MED ORDER — POLYETHYLENE GLYCOL 3350 17 G PO PACK
17.0000 g | PACK | Freq: Every day | ORAL | Status: DC | PRN
  Administered 2013-07-12 – 2013-07-13 (×2): 17 g via ORAL
  Filled 2013-07-11: qty 1

## 2013-07-11 MED ORDER — TAMSULOSIN HCL 0.4 MG PO CAPS
0.4000 mg | ORAL_CAPSULE | Freq: Every day | ORAL | Status: DC
  Administered 2013-07-12 – 2013-07-15 (×4): 0.4 mg via ORAL
  Filled 2013-07-11 (×4): qty 1

## 2013-07-11 MED ORDER — EPHEDRINE SULFATE 50 MG/ML IJ SOLN
INTRAMUSCULAR | Status: DC | PRN
  Administered 2013-07-11 (×3): 5 mg via INTRAVENOUS

## 2013-07-11 MED ORDER — DEXAMETHASONE SODIUM PHOSPHATE 10 MG/ML IJ SOLN: 10.0000 mg | Freq: Once | INTRAMUSCULAR | Status: DC

## 2013-07-11 MED ORDER — MEPERIDINE HCL 50 MG/ML IJ SOLN: 6.2500 mg | INTRAMUSCULAR | Status: DC | PRN

## 2013-07-11 MED ORDER — DEXAMETHASONE SODIUM PHOSPHATE 10 MG/ML IJ SOLN
10.0000 mg | Freq: Every day | INTRAMUSCULAR | Status: AC
  Filled 2013-07-11: qty 1

## 2013-07-11 MED ORDER — SODIUM CHLORIDE 0.9 % IV SOLN: INTRAVENOUS | Status: DC

## 2013-07-11 MED ORDER — LACTATED RINGERS IV SOLN: INTRAVENOUS | Status: DC

## 2013-07-11 MED ORDER — MIDAZOLAM HCL 5 MG/5ML IJ SOLN
INTRAMUSCULAR | Status: DC | PRN
  Administered 2013-07-11 (×2): 1 mg via INTRAVENOUS

## 2013-07-11 MED ORDER — METHOCARBAMOL 500 MG PO TABS
500.0000 mg | ORAL_TABLET | Freq: Four times a day (QID) | ORAL | Status: DC | PRN
  Administered 2013-07-11 – 2013-07-15 (×7): 500 mg via ORAL
  Filled 2013-07-11 (×7): qty 1

## 2013-07-11 MED ORDER — NITROGLYCERIN 0.4 MG SL SUBL: 0.4000 mg | SUBLINGUAL_TABLET | SUBLINGUAL | Status: DC | PRN

## 2013-07-11 MED ORDER — ATORVASTATIN CALCIUM 20 MG PO TABS
20.0000 mg | ORAL_TABLET | Freq: Every morning | ORAL | Status: DC
  Administered 2013-07-12 – 2013-07-15 (×4): 20 mg via ORAL
  Filled 2013-07-11 (×5): qty 1

## 2013-07-11 MED ORDER — ONDANSETRON HCL 4 MG PO TABS
4.0000 mg | ORAL_TABLET | Freq: Four times a day (QID) | ORAL | Status: DC | PRN
  Administered 2013-07-15: 4 mg via ORAL
  Filled 2013-07-11: qty 1

## 2013-07-11 MED ORDER — DIPHENHYDRAMINE HCL 12.5 MG/5ML PO ELIX: 12.5000 mg | ORAL_SOLUTION | ORAL | Status: DC | PRN

## 2013-07-11 MED ORDER — CHLORHEXIDINE GLUCONATE 4 % EX LIQD
60.0000 mL | Freq: Once | CUTANEOUS | Status: DC
  Filled 2013-07-11: qty 60

## 2013-07-11 MED ORDER — METOCLOPRAMIDE HCL 5 MG/ML IJ SOLN
5.0000 mg | Freq: Three times a day (TID) | INTRAMUSCULAR | Status: DC | PRN
  Administered 2013-07-11: 5 mg via INTRAVENOUS
  Filled 2013-07-11: qty 2

## 2013-07-11 MED ORDER — ACETAMINOPHEN 500 MG PO TABS
1000.0000 mg | ORAL_TABLET | Freq: Once | ORAL | Status: DC
  Filled 2013-07-11: qty 2

## 2013-07-11 MED ORDER — PHENOL 1.4 % MT LIQD
1.0000 | OROMUCOSAL | Status: DC | PRN
  Filled 2013-07-11: qty 177

## 2013-07-11 MED ORDER — LACTATED RINGERS IV SOLN
INTRAVENOUS | Status: DC | PRN
  Administered 2013-07-11 (×2): via INTRAVENOUS

## 2013-07-11 MED ORDER — FENTANYL CITRATE 0.05 MG/ML IJ SOLN
INTRAMUSCULAR | Status: DC | PRN
  Administered 2013-07-11: 100 ug via INTRAVENOUS

## 2013-07-11 MED ORDER — HYDROMORPHONE HCL 2 MG PO TABS
2.0000 mg | ORAL_TABLET | ORAL | Status: DC | PRN
  Administered 2013-07-11 – 2013-07-12 (×13): 2 mg via ORAL
  Administered 2013-07-13 (×4): 4 mg via ORAL
  Administered 2013-07-14 (×4): 2 mg via ORAL
  Administered 2013-07-15 (×3): 4 mg via ORAL
  Filled 2013-07-11 (×3): qty 1
  Filled 2013-07-11 (×3): qty 2
  Filled 2013-07-11 (×5): qty 1
  Filled 2013-07-11: qty 2
  Filled 2013-07-11 (×4): qty 1
  Filled 2013-07-11 (×3): qty 2
  Filled 2013-07-11 (×2): qty 1
  Filled 2013-07-11: qty 2
  Filled 2013-07-11 (×4): qty 1

## 2013-07-11 MED ORDER — BUPIVACAINE IN DEXTROSE 0.75-8.25 % IT SOLN
INTRATHECAL | Status: DC | PRN
  Administered 2013-07-11: 1.8 mL via INTRATHECAL

## 2013-07-11 MED ORDER — METOCLOPRAMIDE HCL 10 MG PO TABS
5.0000 mg | ORAL_TABLET | Freq: Three times a day (TID) | ORAL | Status: DC | PRN
  Administered 2013-07-13: 10 mg via ORAL
  Filled 2013-07-11: qty 1

## 2013-07-11 MED ORDER — FENTANYL CITRATE 0.05 MG/ML IJ SOLN: 25.0000 ug | INTRAMUSCULAR | Status: DC | PRN

## 2013-07-11 MED ORDER — CEFAZOLIN SODIUM 1-5 GM-% IV SOLN
1.0000 g | Freq: Four times a day (QID) | INTRAVENOUS | Status: AC
  Administered 2013-07-11 (×2): 1 g via INTRAVENOUS
  Filled 2013-07-11 (×3): qty 50

## 2013-07-11 MED ORDER — ACETAMINOPHEN 500 MG PO TABS
1000.0000 mg | ORAL_TABLET | Freq: Four times a day (QID) | ORAL | Status: DC
  Administered 2013-07-11 – 2013-07-15 (×14): 1000 mg via ORAL
  Filled 2013-07-11 (×24): qty 2

## 2013-07-11 MED ORDER — SODIUM CHLORIDE 0.9 % IV BOLUS (SEPSIS)
500.0000 mL | Freq: Once | INTRAVENOUS | Status: AC
  Administered 2013-07-11: 500 mL via INTRAVENOUS

## 2013-07-11 MED ORDER — DOCUSATE SODIUM 100 MG PO CAPS
100.0000 mg | ORAL_CAPSULE | Freq: Two times a day (BID) | ORAL | Status: DC
  Administered 2013-07-11 – 2013-07-15 (×8): 100 mg via ORAL
  Filled 2013-07-11 (×5): qty 1

## 2013-07-11 MED ORDER — ACETAMINOPHEN 650 MG RE SUPP
650.0000 mg | Freq: Four times a day (QID) | RECTAL | Status: DC
  Filled 2013-07-11 (×16): qty 1

## 2013-07-11 MED ORDER — PROMETHAZINE HCL 25 MG/ML IJ SOLN: 6.2500 mg | INTRAMUSCULAR | Status: DC | PRN

## 2013-07-11 MED ORDER — SODIUM CHLORIDE 0.9 % IR SOLN
Status: DC | PRN
  Administered 2013-07-11: 1000 mL

## 2013-07-11 MED ORDER — KETOROLAC TROMETHAMINE 15 MG/ML IJ SOLN
7.5000 mg | Freq: Four times a day (QID) | INTRAMUSCULAR | Status: AC | PRN
  Administered 2013-07-11 – 2013-07-12 (×3): 7.5 mg via INTRAVENOUS
  Filled 2013-07-11 (×3): qty 1

## 2013-07-11 MED ORDER — BUPIVACAINE LIPOSOME 1.3 % IJ SUSP
20.0000 mL | Freq: Once | INTRAMUSCULAR | Status: DC
  Filled 2013-07-11: qty 20

## 2013-07-11 MED ORDER — OXYCODONE HCL 5 MG PO TABS
5.0000 mg | ORAL_TABLET | ORAL | Status: DC | PRN
  Administered 2013-07-11: 10 mg via ORAL
  Filled 2013-07-11: qty 2

## 2013-07-11 MED ORDER — PROPOFOL INFUSION 10 MG/ML OPTIME
INTRAVENOUS | Status: DC | PRN
  Administered 2013-07-11: 140 ug/kg/min via INTRAVENOUS

## 2013-07-11 MED ORDER — MORPHINE SULFATE 2 MG/ML IJ SOLN
1.0000 mg | INTRAMUSCULAR | Status: DC | PRN
  Administered 2013-07-12 – 2013-07-13 (×5): 2 mg via INTRAVENOUS
  Filled 2013-07-11 (×5): qty 1

## 2013-07-11 MED ORDER — RIVAROXABAN 10 MG PO TABS
10.0000 mg | ORAL_TABLET | Freq: Every day | ORAL | Status: DC
Start: 2013-07-12 — End: 2013-07-14
  Administered 2013-07-12 – 2013-07-14 (×3): 10 mg via ORAL
  Filled 2013-07-11 (×4): qty 1

## 2013-07-11 SURGICAL SUPPLY — 63 items
APL SKNCLS STERI-STRIP NONHPOA (GAUZE/BANDAGES/DRESSINGS) ×1
BAG SPEC THK2 15X12 ZIP CLS (MISCELLANEOUS)
BAG ZIPLOCK 12X15 (MISCELLANEOUS) ×1 IMPLANT
BANDAGE ELASTIC 6 VELCRO ST LF (GAUZE/BANDAGES/DRESSINGS) ×2 IMPLANT
BANDAGE ESMARK 6X9 LF (GAUZE/BANDAGES/DRESSINGS) ×1 IMPLANT
BENZOIN TINCTURE PRP APPL 2/3 (GAUZE/BANDAGES/DRESSINGS) ×1 IMPLANT
BLADE SAG 18X100X1.27 (BLADE) ×2 IMPLANT
BLADE SAW SGTL 11.0X1.19X90.0M (BLADE) ×2 IMPLANT
BNDG CMPR 9X6 STRL LF SNTH (GAUZE/BANDAGES/DRESSINGS) ×1
BNDG ESMARK 6X9 LF (GAUZE/BANDAGES/DRESSINGS) ×2
BOWL SMART MIX CTS (DISPOSABLE) ×2 IMPLANT
CAPT RP KNEE ×1 IMPLANT
CEMENT HV SMART SET (Cement) ×4 IMPLANT
CLOTH BEACON ORANGE TIMEOUT ST (SAFETY) ×2 IMPLANT
CUFF TOURN SGL QUICK 34 (TOURNIQUET CUFF) ×2
CUFF TRNQT CYL 34X4X40X1 (TOURNIQUET CUFF) ×1 IMPLANT
DECANTER SPIKE VIAL GLASS SM (MISCELLANEOUS) ×2 IMPLANT
DRAPE EXTREMITY T 121X128X90 (DRAPE) ×2 IMPLANT
DRAPE POUCH INSTRU U-SHP 10X18 (DRAPES) ×2 IMPLANT
DRAPE U-SHAPE 47X51 STRL (DRAPES) ×2 IMPLANT
DRSG ADAPTIC 3X8 NADH LF (GAUZE/BANDAGES/DRESSINGS) ×2 IMPLANT
DRSG EMULSION OIL 3X16 NADH (GAUZE/BANDAGES/DRESSINGS) ×1 IMPLANT
DRSG PAD ABDOMINAL 8X10 ST (GAUZE/BANDAGES/DRESSINGS) ×2 IMPLANT
DURAPREP 26ML APPLICATOR (WOUND CARE) ×2 IMPLANT
ELECT REM PT RETURN 9FT ADLT (ELECTROSURGICAL) ×2
ELECTRODE REM PT RTRN 9FT ADLT (ELECTROSURGICAL) ×1 IMPLANT
EVACUATOR 1/8 PVC DRAIN (DRAIN) ×2 IMPLANT
FACESHIELD LNG OPTICON STERILE (SAFETY) ×10 IMPLANT
GLOVE BIO SURGEON STRL SZ7.5 (GLOVE) ×1 IMPLANT
GLOVE BIO SURGEON STRL SZ8 (GLOVE) ×2 IMPLANT
GLOVE BIOGEL PI IND STRL 8 (GLOVE) ×1 IMPLANT
GLOVE BIOGEL PI INDICATOR 8 (GLOVE) ×1
GLOVE SURG SS PI 6.5 STRL IVOR (GLOVE) ×2 IMPLANT
GOWN STRL NON-REIN LRG LVL3 (GOWN DISPOSABLE) ×3 IMPLANT
GOWN STRL REIN XL XLG (GOWN DISPOSABLE) ×2 IMPLANT
HANDPIECE INTERPULSE COAX TIP (DISPOSABLE) ×2
IMMOBILIZER KNEE 20 (SOFTGOODS) ×2
IMMOBILIZER KNEE 20 THIGH 36 (SOFTGOODS) ×1 IMPLANT
IMMOBILIZER KNEE 22 UNIV (SOFTGOODS) ×1 IMPLANT
KIT BASIN OR (CUSTOM PROCEDURE TRAY) ×2 IMPLANT
MANIFOLD NEPTUNE II (INSTRUMENTS) ×2 IMPLANT
NDL SAFETY ECLIPSE 18X1.5 (NEEDLE) ×2 IMPLANT
NEEDLE HYPO 18GX1.5 SHARP (NEEDLE) ×4
NS IRRIG 1000ML POUR BTL (IV SOLUTION) ×2 IMPLANT
PACK TOTAL JOINT (CUSTOM PROCEDURE TRAY) ×2 IMPLANT
PADDING CAST COTTON 6X4 STRL (CAST SUPPLIES) ×6 IMPLANT
PADDING CAST SYN 6 (CAST SUPPLIES) ×1
PADDING CAST SYNTHETIC 6X4 NS (CAST SUPPLIES) IMPLANT
POSITIONER SURGICAL ARM (MISCELLANEOUS) ×2 IMPLANT
SET HNDPC FAN SPRY TIP SCT (DISPOSABLE) ×1 IMPLANT
SPONGE GAUZE 4X4 12PLY (GAUZE/BANDAGES/DRESSINGS) ×2 IMPLANT
STRIP CLOSURE SKIN 1/2X4 (GAUZE/BANDAGES/DRESSINGS) ×3 IMPLANT
SUCTION FRAZIER 12FR DISP (SUCTIONS) ×2 IMPLANT
SUT MNCRL AB 4-0 PS2 18 (SUTURE) ×2 IMPLANT
SUT VIC AB 2-0 CT1 27 (SUTURE) ×8
SUT VIC AB 2-0 CT1 TAPERPNT 27 (SUTURE) ×3 IMPLANT
SUT VLOC 180 0 24IN GS25 (SUTURE) ×2 IMPLANT
SYR 20CC LL (SYRINGE) ×2 IMPLANT
SYR 50ML LL SCALE MARK (SYRINGE) ×2 IMPLANT
TOWEL OR 17X26 10 PK STRL BLUE (TOWEL DISPOSABLE) ×4 IMPLANT
TRAY FOLEY CATH 14FRSI W/METER (CATHETERS) ×2 IMPLANT
WATER STERILE IRR 1500ML POUR (IV SOLUTION) ×1 IMPLANT
WRAP KNEE MAXI GEL POST OP (GAUZE/BANDAGES/DRESSINGS) ×2 IMPLANT

## 2013-07-11 NOTE — Anesthesia Postprocedure Evaluation (Signed)
  Anesthesia Post-op Note  Patient: Peter Morgan  Procedure(s) Performed: Procedure(s) (LRB): LEFT TOTAL KNEE ARTHROPLASTY  (Left)  Patient Location: PACU  Anesthesia Type: Spinal  Level of Consciousness: awake and alert   Airway and Oxygen Therapy: Patient Spontanous Breathing  Post-op Pain: mild  Post-op Assessment: Post-op Vital signs reviewed, Patient's Cardiovascular Status Stable, Respiratory Function Stable, Patent Airway and No signs of Nausea or vomiting  Last Vitals:  Filed Vitals:   07/11/13 1255  BP: 120/66  Pulse: 67  Temp:   Resp: 16    Post-op Vital Signs: stable   Complications: No apparent anesthesia complications

## 2013-07-11 NOTE — Progress Notes (Signed)
Avel Peace paged and made aware of patient's current blood pressure. Patient is asymptomatic. New order given to bolus with 500cc of normal saline and patient placed in trendelenburg.

## 2013-07-11 NOTE — Significant Event (Signed)
Rapid Response Event Note  Overview: Called for altered LOC, inability to obtain BP, HR 33      Initial Focused Assessment: Pt verbalizing upon arrival, HR 40s, BP 81/49    Interventions: 1L NS bolus and 12 lead EkG obtained.   Event Summary: Dr Despina Hick notified of Pt status and updated after EKG. At 1pm  BP BP 127/72, HR 72 . Wife at bedside.   at      at          [name], [name]

## 2013-07-11 NOTE — Anesthesia Procedure Notes (Signed)
Spinal Patient location during procedure: OR Staffing Anesthesiologist: Breccan Galant Performed by: anesthesiologist  Preanesthetic Checklist Completed: patient identified, site marked, surgical consent, pre-op evaluation, timeout performed, IV checked, risks and benefits discussed and monitors and equipment checked Spinal Block Patient position: sitting Prep: Betadine Patient monitoring: heart rate, continuous pulse ox and blood pressure Approach: midline Location: L3-4 Injection technique: single-shot Needle Needle type: Sprotte  Needle gauge: 24 G Needle length: 9 cm Additional Notes Expiration date of kit checked and confirmed. Patient tolerated procedure well, without complications.     

## 2013-07-11 NOTE — Progress Notes (Signed)
Utilization review completed.  

## 2013-07-11 NOTE — Consult Note (Signed)
CARDIOLOGY CONSULT NOTE  Patient ID: Peter Morgan, MRN: [id number], DOB/AGE: September 08, 1946 67 y.o. Admit date: 07/11/2013 Date of Consult: 07/11/2013  Primary Physician: Enrique Sack, MD Primary Cardiologist: Dr Antoine Poche Referring Physician: Dr Lequita Halt  Chief Complaint: hypotension, bradycardia Reason for Consultation: same  HPI: 67 y.o. male w/ PMHx significant for CAD who underwent right total knee arthroplasty this morning. Postoperatively the patient developed bradycardia and hypotension and was transferred to a step down bed. He has had persistent hypotension requiring fluid boluses. We are asked to see him to assist with management and evaluation.  The patient has coronary artery disease. He underwent PCI of the left circumflex for exertional angina and an abnormal stress test about 18 months ago. He has been maintained on dual antiplatelet therapy with aspirin and Plavix and just held these medications 5 days prior to his knee replacement. He has done well with no cardiac problems. He specifically denies chest pain, chest pressure, dyspnea, edema, or palpitations.  He's had some trouble with pain control since surgery this morning. He feels nauseated with analgesics. He has had no chest pain or shortness of breath today. He has no other complaints at the present time.  Past Medical History  Diagnosis Date  . Hyperlipidemia   . BPH (benign prostatic hyperplasia)   . Arthritis   . CAD (coronary artery disease)     Diagnosed 12/2011 following abnormal treadmill test - high grade OM stenosis s/p DES 01/05/12, mild residual non-obstructive LAD, Lcx, RCA disease.  Normal LV function with EF 55-65% by cath 01/05/12  . Hypertension   . GERD (gastroesophageal reflux disease)       Surgical History:  Past Surgical History  Procedure Laterality Date  . Knee arthroscopy      bilateral  . Appendectomy    . Tonsillectomy and adenoidectomy    . Nasal septum surgery    . Femur surgery     . Coronary stents     . Right middle finger amputation        Home Meds: Prior to Admission medications   Medication Sig Start Date End Date Taking? Authorizing Provider  acetaminophen (TYLENOL) 500 MG tablet Take 1,000 mg by mouth every 4 (four) hours as needed for pain. Pain   Yes Historical Provider, MD  aspirin 81 MG tablet Take 81 mg by mouth daily.    Yes Historical Provider, MD  atorvastatin (LIPITOR) 20 MG tablet Take 20 mg by mouth every morning.   Yes Historical Provider, MD  clopidogrel (PLAVIX) 75 MG tablet Take 75 mg by mouth every morning.   Yes Historical Provider, MD  metoprolol succinate (TOPROL-XL) 50 MG 24 hr tablet Take 25 mg by mouth every morning. Take with or immediately following a meal.   Yes Historical Provider, MD  pantoprazole (PROTONIX) 40 MG tablet Take 40 mg by mouth daily.   Yes Historical Provider, MD  tamsulosin (FLOMAX) 0.4 MG CAPS Take 0.4 mg by mouth daily after breakfast.   Yes Historical Provider, MD  nitroGLYCERIN (NITROSTAT) 0.4 MG SL tablet Place 0.4 mg under the tongue every 5 (five) minutes as needed for chest pain.    Historical Provider, MD    Inpatient Medications:  . acetaminophen  1,000 mg Oral Q6H   Or  . acetaminophen  650 mg Rectal Q6H  . atorvastatin  20 mg Oral q morning - 10a  .  ceFAZolin (ANCEF) IV  1 g Intravenous Q6H  . [START ON 07/12/2013] dexamethasone  10 mg  Oral Daily   Or  . [START ON 07/12/2013] dexamethasone  10 mg Intravenous Daily  . docusate sodium  100 mg Oral BID  . [START ON 07/12/2013] metoprolol succinate  25 mg Oral q morning - 10a  . [START ON 07/12/2013] pantoprazole  40 mg Oral Daily  . [START ON 07/12/2013] rivaroxaban  10 mg Oral Q breakfast  . [START ON 07/12/2013] tamsulosin  0.4 mg Oral Daily   . dextrose 5 % and 0.9% NaCl 100 mL/hr at 07/11/13 1123    Allergies:  Allergies  Allergen Reactions  . Codeine Nausea And Vomiting    History   Social History  . Marital Status: Married    Spouse Name:  N/A    Number of Children: 1  . Years of Education: N/A   Occupational History  . Not on file.   Social History Main Topics  . Smoking status: Never Smoker   . Smokeless tobacco: Never Used  . Alcohol Use: 3.0 oz/week    5 Glasses of wine per week  . Drug Use: No  . Sexually Active: Not on file   Other Topics Concern  . Not on file   Social History Narrative   Artist, makes furniture.     Family History  Problem Relation Age of Onset  . Coronary artery disease Father     "Hardening of the arteries"     Review of Systems: General: negative for chills, fever, night sweats or weight changes.  ENT: negative for rhinorrhea or epistaxis Cardiovascular: negative for chest pain, shortness of breath, dyspnea on exertion, edema, orthopnea, palpitations, or paroxysmal nocturnal dyspnea Dermatological: negative for rash Respiratory: negative for cough or wheezing GI: negative for nausea, vomiting, diarrhea, bright red blood per rectum, melena, or hematemesis GU: no hematuria, urgency, or frequency Neurologic: negative for visual changes, syncope, headache, or dizziness Heme: no easy bruising or bleeding Endo: negative for excessive thirst, thyroid disorder, or flushing Musculoskeletal: Positive for bilateral knee pain All other systems reviewed and are otherwise negative except as noted above.  Physical Exam: Blood pressure 103/59, pulse 79, temperature 97.8 F (36.6 C), temperature source Oral, resp. rate 15, height 5\' 9"  (1.753 m), weight 78.291 kg (172 lb 9.6 oz), SpO2 99.00%. General: Well developed, well nourished, alert and oriented, in no acute distress. HEENT: Normocephalic, atraumatic, sclera non-icteric, no xanthomas, nares are without discharge.  Neck: Supple. Carotids 2+ without bruits. JVP normal Lungs: Clear bilaterally to auscultation without wheezes, rales, or rhonchi. Breathing is unlabored. Heart: RRR with normal S1 and S2. No murmurs, rubs, or gallops  appreciated. Abdomen: Soft, non-tender, non-distended with normoactive bowel sounds. No hepatomegaly. No rebound/guarding. No obvious abdominal masses. Back: No CVA tenderness Msk:  Strength and tone appear normal for age. Extremities: The right leg is wrapped. There is no left leg edema.  Distal pedal pulses are 2+ and equal bilaterally. Neuro: CNII-XII intact, moves all extremities spontaneously. Psych:  Responds to questions appropriately with a normal affect.    Labs: No results found for this basename: CKTOTAL, CKMB, TROPONINI,  in the last 72 hours Lab Results  Component Value Date   WBC 5.6 07/06/2013   HGB 13.1 07/06/2013   HCT 39.7 07/06/2013   MCV 97.3 07/06/2013   PLT 118* 07/06/2013    Recent Labs Lab 07/06/13 0920  NA 134*  K 4.7  CL 97  CO2 30  BUN 15  CREATININE 0.86  CALCIUM 9.9  PROT 7.0  BILITOT 0.4  ALKPHOS 66  ALT  15  AST 15  GLUCOSE 96   Lab Results  Component Value Date   CHOL 157 02/24/2013   HDL 59.80 02/24/2013   LDLCALC 79 02/24/2013   TRIG 90.0 02/24/2013   No results found for this basename: DDIMER    Radiology/Studies:  Dg Chest 2 View  07/06/2013   *RADIOLOGY REPORT*  Clinical Data: Hypertension.  CHEST - 2 VIEW  Comparison: January 21, 2012.  Findings: Cardiomediastinal silhouette appears normal.  No acute pulmonary disease is noted.  Bony thorax is intact.  IMPRESSION: No acute cardiopulmonary abnormality seen.   Original Report Authenticated By: Lupita Raider.,  M.D.    EKG: Normal sinus rhythm, within normal limits. Heart rate 66 beats per minute.  ASSESSMENT AND PLAN:  1. Bradycardia and hypotension. I suspect these are related to postoperative pain with a vasovagal episode. He seems to have poor tolerance to postop analgesic medications. I do not appreciate any significant changes on his EKG he has no other concerning symptoms. He has required fluid boluses to maintain his blood pressure and this will need to be carefully followed.  Would check serial enzymes, repeat an EKG in the morning, and continue with supportive care to maintain an adequate blood pressure. As long as enzymes are negative and no changes on his EKG, I do not think he will require further cardiac evaluation.  2. Coronary arthrosclerosis, native vessel. Stable without anginal symptoms. As above, doubt acute coronary event. Should resume antiplatelet therapy when okay from a postoperative perspective.  Will order enzymes and EKG followup, Dr Elease Hashimoto will see in the morning. thx  Enzo Bi  07/11/2013, 6:29 PM

## 2013-07-11 NOTE — Op Note (Signed)
Pre-operative diagnosis- Osteoarthritis  Right knee(s)  Post-operative diagnosis- Osteoarthritis Right knee(s)  Procedure-  Right  Total Knee Arthroplasty  Surgeon- Gus Rankin. Cordel Drewes, MD  Assistant- Avel Peace, PA-C   Anesthesia-  Spinal EBL-* No blood loss amount entered *  Drains Hemovac  Tourniquet time-  Total Tourniquet Time Documented: Thigh (Left) - 39 minutes Total: Thigh (Left) - 39 minutes    Complications- None  Condition-PACU - hemodynamically stable.   Brief Clinical Note  Peter Morgan is a 67 y.o. year old male with end stage OA of his right knee with progressively worsening pain and dysfunction. He has constant pain, with activity and at rest and significant functional deficits with difficulties even with ADLs. He has had extensive non-op management including analgesics, injections of cortisone and viscosupplements, and home exercise program, but remains in significant pain with significant dysfunction. Radiographs show bone on bone arthritis medial and patellofemoral. He presents now for right Total Knee Arthroplasty.    Procedure in detail---   The patient is brought into the operating room and positioned supine on the operating table. After successful administration of  Spinal,   a tourniquet is placed high on the  Right thigh(s) and the lower extremity is prepped and draped in the usual sterile fashion. Time out is performed by the operating team and then the  Right lower extremity is wrapped in Esmarch, knee flexed and the tourniquet inflated to 300 mmHg.       A midline incision is made with a ten blade through the subcutaneous tissue to the level of the extensor mechanism. A fresh blade is used to make a medial parapatellar arthrotomy. Soft tissue over the proximal medial tibia is subperiosteally elevated to the joint line with a knife and into the semimembranosus bursa with a Cobb elevator. Soft tissue over the proximal lateral tibia is elevated with attention  being paid to avoiding the patellar tendon on the tibial tubercle. The patella is everted, knee flexed 90 degrees and the ACL and PCL are removed. Findings are bone on bone medial and patellofemoral with large medial osteophytes.        The drill is used to create a starting hole in the distal femur and the canal is thoroughly irrigated with sterile saline to remove the fatty contents. The 5 degree Right  valgus alignment guide is placed into the femoral canal and the distal femoral cutting block is pinned to remove 10 mm off the distal femur. Resection is made with an oscillating saw.      The tibia is subluxed forward and the menisci are removed. The extramedullary alignment guide is placed referencing proximally at the medial aspect of the tibial tubercle and distally along the second metatarsal axis and tibial crest. The block is pinned to remove 2mm off the more deficient medial  side. Resection is made with an oscillating saw. Size 4is the most appropriate size for the tibia and the proximal tibia is prepared with the modular drill and keel punch for that size.      The femoral sizing guide is placed and size 5 is most appropriate. Rotation is marked off the epicondylar axis and confirmed by creating a rectangular flexion gap at 90 degrees. The size 5 cutting block is pinned in this rotation and the anterior, posterior and chamfer cuts are made with the oscillating saw. The intercondylar block is then placed and that cut is made.      Trial size 4 tibial component, trial size 5  posterior stabilized femur and a 15  mm posterior stabilized rotating platform insert trial is placed. Full extension is achieved with excellent varus/valgus and anterior/posterior balance throughout full range of motion. The patella is everted and thickness measured to be 27  mm. Free hand resection is taken to 15 mm, a 41 template is placed, lug holes are drilled, trial patella is placed, and it tracks normally. Osteophytes are  removed off the posterior femur with the trial in place. All trials are removed and the cut bone surfaces prepared with pulsatile lavage. Cement is mixed and once ready for implantation, the size 4 tibial implant, size  5 posterior stabilized femoral component, and the size 41 patella are cemented in place and the patella is held with the clamp. The trial insert is placed and the knee held in full extension. The Exparel (20 ml mixed with 30 ml saline) and .25% Bupivicaine, are injected into the extensor mechanism, posterior capsule, medial and lateral gutters and subcutaneous tissues.  All extruded cement is removed and once the cement is hard the permanent 15 mm posterior stabilized rotating platform insert is placed into the tibial tray.      The wound is copiously irrigated with saline solution and the extensor mechanism closed over a hemovac drain with #1 PDS suture. The tourniquet is released for a total tourniquet time of 39  minutes. Flexion against gravity is 140 degrees and the patella tracks normally. Subcutaneous tissue is closed with 2.0 vicryl and subcuticular with running 4.0 Monocryl. The incision is cleaned and dried and steri-strips and a bulky sterile dressing are applied. The limb is placed into a knee immobilizer and the patient is awakened and transported to recovery in stable condition.      Please note that a surgical assistant was a medical necessity for this procedure in order to perform it in a safe and expeditious manner. Surgical assistant was necessary to retract the ligaments and vital neurovascular structures to prevent injury to them and also necessary for proper positioning of the limb to allow for anatomic placement of the prosthesis.   Gus Rankin Halli Equihua, MD    07/11/2013, 9:24 AM

## 2013-07-11 NOTE — Anesthesia Preprocedure Evaluation (Addendum)
Anesthesia Evaluation  Patient identified by MRN, date of birth, ID band Patient awake    Reviewed: Allergy & Precautions, H&P , NPO status , Patient's Chart, lab work & pertinent test results  Airway Mallampati: II TM Distance: >3 FB Neck ROM: Full    Dental no notable dental hx.    Pulmonary neg pulmonary ROS,  breath sounds clear to auscultation  Pulmonary exam normal       Cardiovascular hypertension, Pt. on medications - angina+ CAD and + Cardiac Stents negative cardio ROS  Rhythm:Regular Rate:Normal     Neuro/Psych negative neurological ROS  negative psych ROS   GI/Hepatic negative GI ROS, Neg liver ROS,   Endo/Other  negative endocrine ROS  Renal/GU negative Renal ROS  negative genitourinary   Musculoskeletal negative musculoskeletal ROS (+)   Abdominal   Peds negative pediatric ROS (+)  Hematology negative hematology ROS (+)   Anesthesia Other Findings   Reproductive/Obstetrics negative OB ROS                           Anesthesia Physical Anesthesia Plan  ASA: III  Anesthesia Plan: Spinal   Post-op Pain Management:    Induction: Intravenous  Airway Management Planned: Simple Face Mask  Additional Equipment:   Intra-op Plan:   Post-operative Plan:   Informed Consent: I have reviewed the patients History and Physical, chart, labs and discussed the procedure including the risks, benefits and alternatives for the proposed anesthesia with the patient or authorized representative who has indicated his/her understanding and acceptance.   Dental advisory given  Plan Discussed with: CRNA  Anesthesia Plan Comments:        Anesthesia Quick Evaluation

## 2013-07-11 NOTE — Preoperative (Signed)
Beta Blockers   Reason not to administer Beta Blockers:Not Applicable 

## 2013-07-11 NOTE — Interval H&P Note (Signed)
History and Physical Interval Note:  07/11/2013 7:04 AM  Peter Morgan  has presented today for surgery, with the diagnosis of OA LEFT KNEE  The various methods of treatment have been discussed with the patient and family. After consideration of risks, benefits and other options for treatment, the patient has consented to  Procedure(s): LEFT TOTAL KNEE ARTHROPLASTY  (Left) as a surgical intervention .  The patient's history has been reviewed, patient examined, no change in status, stable for surgery.  I have reviewed the patient's chart and labs.  Questions were answered to the patient's satisfaction.     Loanne Drilling

## 2013-07-11 NOTE — H&P (View-Only) (Signed)
Peter Morgan. Inabinet  DOB: [birth year] Married / Language: English / Race: White Male  Date of Admission:  07/11/2013  Chief Complaint:  Left Knee Pain  History of Present Illness The patient is a 67 year old male who comes in for a preoperative History and Physical. The patient is scheduled for a left total knee arthroplasty to be performed by Dr. Gus Rankin. Aluisio, MD on 07/11/2013. The patient is a 67 year old male who presents for follow up of their knee. The patient is being followed for their bilateral knee pain and osteoarthritis. They are now 5 week(s) out from cortisone injections. Symptoms reported today include: pain and swelling. The patient feels that they are doing well and report their pain level to be mild to moderate. Current treatment includes: NSAIDs (aspirin) and ice and heat. The patient has reported improvement of their symptoms with: Cortisone injections. The patient states that the left knee does bother him a lot more than the right. He said the cortisone helped some with the patient, but he is still having a lot of dysfunction, especially in that left knee. He likes to remain extremely active. He does a lot of wood working and work a round the house. He states that the knee is starting to prevent him from doing things he desires. He is at a stage where he would like to get something done in order to remain active. He feels like the knee is the only thing that is holding him back from doing everything he wants to. He is ready to proceed with surgery. They have been treated conservatively in the past for the above stated problem and despite conservative measures, they continue to have progressive pain and severe functional limitations and dysfunction. They have failed non-operative management including home exercise, medications, and injections. It is felt that they would benefit from undergoing total joint replacement. Risks and benefits of the procedure have been  discussed with the patient and they elect to proceed with surgery. There are no active contraindications to surgery such as ongoing infection or rapidly progressive neurological disease.   Problem List Primary osteoarthritis of both knees (715.16)   Allergies Codeine Derivatives. Nausea. TraMADol HCl *ANALGESICS - OPIOID*. Nausea.   Family History Congestive Heart Failure. father Heart Disease. father   Social History Living situation. live with spouse Marital status. married Illicit drug use. no Drug/Alcohol Rehab (Previously). no Exercise. Exercises daily; does running / walking and gym / weights Tobacco use. never smoker Number of flights of stairs before winded. greater than 5 Pain Contract. no Drug/Alcohol Rehab (Currently). no Children. 1 Current work status. working part time Alcohol use. current drinker; drinks wine; 5-7 per week Post-Surgical Plans. Home Advance Directives. Living Will, Healthcare POA   Medication History Aspirin EC (81MG  Tablet DR, Oral) Active. Atorvastatin Calcium (20MG  Tablet, Oral) Active. Clopidogrel Bisulfate (75MG  Tablet, Oral) Active. Metoprolol Succinate ER (50MG  Tablet ER 24HR, Oral) Active. (25mg ) Pantoprazole Sodium (40MG  Tablet DR, Oral) Active. Flomax (0.4MG  Capsule, Oral every morning) Active.   Past Surgical History Straighten Nasal Septum Sinus Surgery Arthroscopy of Knee. bilateral Heart Stents. ONE (99% Blockage)   Medical History Coronary artery disease Urinary Frequency   Review of Systems General:Not Present- Chills, Fever, Night Sweats, Fatigue, Weight Gain, Weight Loss and Memory Loss. Skin:Not Present- Hives, Itching, Rash, Eczema and Lesions. HEENT:Not Present- Tinnitus, Headache, Double Vision, Visual Loss, Hearing Loss and Dentures. Respiratory:Not Present- Shortness of breath with exertion, Shortness of breath at rest, Allergies, Coughing up blood and  Chronic  Cough. Cardiovascular:Not Present- Chest Pain, Racing/skipping heartbeats, Difficulty Breathing Lying Down, Murmur, Swelling and Palpitations. Gastrointestinal:Not Present- Bloody Stool, Heartburn, Abdominal Pain, Vomiting, Nausea, Constipation, Diarrhea, Difficulty Swallowing, Jaundice and Loss of appetitie. Male Genitourinary:Present- Urinary frequency and Urinating at Night. Not Present- Blood in Urine, Weak urinary stream, Discharge, Flank Pain, Incontinence, Painful Urination, Urgency and Urinary Retention. Musculoskeletal:Present- Joint Pain and Morning Stiffness. Not Present- Muscle Pain, Joint Swelling, Back Pain and Spasms. Neurological:Not Present- Tremor, Dizziness, Blackout spells, Paralysis, Difficulty with balance and Weakness. Psychiatric:Not Present- Insomnia.   Vitals Weight: 172 lb Height: 70 in Weight was reported by patient. Body Surface Area: 1.96 m Body Mass Index: 24.68 kg/m Pulse: 76 (Regular) Resp.: 12 (Unlabored) BP: 108/68 (Sitting, Right Arm, Standard)    Physical Exam The physical exam findings are as follows:  Note: Patient is a 67 year old male with continued pain. Patient is accompanied today by his wife [name].   General Mental Status - Alert, cooperative and good historian. General Appearance- pleasant. Not in acute distress. Orientation- Oriented X3. Build & Nutrition- Well nourished and Well developed.   Head and Neck Head- normocephalic, atraumatic . Neck Global Assessment- supple. no bruit auscultated on the right and no bruit auscultated on the left.   Eye Pupil- Bilateral- Regular and Round. Motion- Bilateral- EOMI.   Chest and Lung Exam Auscultation: Breath sounds:- clear at anterior chest wall and - clear at posterior chest wall. Adventitious sounds:- No Adventitious sounds.   Cardiovascular Auscultation:Rhythm- Regular rate and rhythm. Heart Sounds- S1 WNL and S2 WNL. Murmurs & Other  Heart Sounds:Auscultation of the heart reveals - No Murmurs.   Abdomen Palpation/Percussion:Tenderness- Abdomen is non-tender to palpation. Rigidity (guarding)- Abdomen is soft. Auscultation:Auscultation of the abdomen reveals - Bowel sounds normal.   Male Genitourinary  Not done, not pertinent to present illness  Musculoskeletal On exam he is a well developed male, alert and oriented in no apparent distress. Both knees show no effusion. The right knee shows no deformity. Range of motion about 5 to 125. There is marked crepitus on range of motion with some slight tenderness medial greater than lateral with no instability noted. The left knee shows a varus deformity. No effusion. Range 5 to 135. Marked crepitus on range of motion. Tenderness medial greater than lateral with no instability noted. Pulse sensation and motor intact both lower extremities.  RADIOGRAPHS: AP both knees and lateral which show tricompartmental bone on bone arthritis in the right knee with no deformity. He also has a previous fracture of the right distal fibula with a lot of callus formation. His left knee shows bone on bone medial and patellofemoral with significant chondrocalcinosis laterally.  Assessment & Plan Primary osteoarthritis of both knees (715.16) Impression: Left Knee  Note: Plan is for a Left Total Knee Replacement by Dr. Lequita Halt.  Plan is to go home.  The patient will not receive TXA (tranexamic acid) due to: Coronary Arterial Disease  PCP - Dr. Elmore Guise - Patient has been seen preoperatively and felt to be stable for surgery. Cardiology - Dr. Antoine Poche - Patient has been seen preoperatively and felt to be stable for surgery.  Time spent: greater than 30 minutes.  Signed electronically by Lauraine Rinne, III PA-C

## 2013-07-11 NOTE — Transfer of Care (Signed)
Immediate Anesthesia Transfer of Care Note  Patient: Peter Morgan  Procedure(s) Performed: Procedure(s): LEFT TOTAL KNEE ARTHROPLASTY  (Left)  Patient Location: PACU  Anesthesia Type:Spinal  Level of Consciousness: awake, alert  and oriented  Airway & Oxygen Therapy: Patient Spontanous Breathing and Patient connected to nasal cannula oxygen  Post-op Assessment: Report given to PACU RN and Post -op Vital signs reviewed and stable  Post vital signs: Reviewed and stable  Complications: No apparent anesthesia complications

## 2013-07-11 NOTE — Progress Notes (Signed)
PT Cancellation Note  Patient Details Name: Peter Morgan MRN: [id number] DOB: 02-May-1946   Cancelled Treatment:    Reason Eval/Treat Not Completed: Medical issues which prohibited therapy-Unable to see POD 0-Rapid Response. Will attempt PT eval on tomorrow.    Rebeca Alert, MPT Pager: [phone]

## 2013-07-12 ENCOUNTER — Encounter (HOSPITAL_COMMUNITY): Payer: Self-pay | Admitting: Orthopedic Surgery

## 2013-07-12 DIAGNOSIS — I959 Hypotension, unspecified: Secondary | ICD-10-CM | POA: Diagnosis not present

## 2013-07-12 DIAGNOSIS — D62 Acute posthemorrhagic anemia: Secondary | ICD-10-CM | POA: Diagnosis not present

## 2013-07-12 DIAGNOSIS — I498 Other specified cardiac arrhythmias: Secondary | ICD-10-CM

## 2013-07-12 DIAGNOSIS — I2510 Atherosclerotic heart disease of native coronary artery without angina pectoris: Secondary | ICD-10-CM

## 2013-07-12 DIAGNOSIS — Z9289 Personal history of other medical treatment: Secondary | ICD-10-CM

## 2013-07-12 LAB — CBC
MCH: 33.1 pg (ref 26.0–34.0)
MCHC: 34 g/dL (ref 30.0–36.0)
MCV: 97.2 fL (ref 78.0–100.0)
Platelets: DECREASED 10*3/uL (ref 150–400)
RDW: 13.2 % (ref 11.5–15.5)

## 2013-07-12 LAB — TROPONIN I: Troponin I: <0.3 ng/mL (ref <0.30)

## 2013-07-12 LAB — BASIC METABOLIC PANEL
Calcium: 8.1 mg/dL — ABNORMAL LOW (ref 8.4–10.5)
Creatinine, Ser: 0.85 mg/dL (ref 0.50–1.35)
GFR calc non Af Amer: 88 mL/min — ABNORMAL LOW (ref >90)
Sodium: 136 mEq/L (ref 135–145)

## 2013-07-12 MED ORDER — ACETAMINOPHEN 325 MG PO TABS
650.0000 mg | ORAL_TABLET | Freq: Once | ORAL | Status: DC
  Filled 2013-07-12: qty 2

## 2013-07-12 NOTE — Progress Notes (Signed)
Cardiologist on call for Dr Excell Seltzer made aware of Pt BP being in the low 90s and the drop in Pt's Hgb from 13.1 pre-op to 8.2 today. No new order received. Pt alert and oriented, in no acute distress. Will continue to monitor.

## 2013-07-12 NOTE — Progress Notes (Signed)
CARDIOLOGY CONSULT NOTE  Patient ID: Peter Morgan, MRN: [id number], DOB/AGE: [birth year] 67 y.o. Admit date: 07/11/2013 Date of Consult: 07/12/2013  Primary Physician: Enrique Sack, MD Primary Cardiologist: Dr Antoine Poche Referring Physician: Dr Lequita Halt  Chief Complaint: hypotension, bradycardia  HPI: 67 y.o. male w/ PMHx significant for CAD who underwent right total knee arthroplasty yesterday.  he has had some post op hypotension and bradycardia.    His HR is back up to normal.  His BP has remained low - he has had a Hb drop of > 4 gm/dl  Past Medical History  Diagnosis Date  . Hyperlipidemia   . BPH (benign prostatic hyperplasia)   . Arthritis   . CAD (coronary artery disease)     Diagnosed 12/2011 following abnormal treadmill test - high grade OM stenosis s/p DES 01/05/12, mild residual non-obstructive LAD, Lcx, RCA disease.  Normal LV function with EF 55-65% by cath 01/05/12  . Hypertension   . GERD (gastroesophageal reflux disease)       Surgical History:  Past Surgical History  Procedure Laterality Date  . Knee arthroscopy      bilateral  . Appendectomy    . Tonsillectomy and adenoidectomy    . Nasal septum surgery    . Femur surgery    . Coronary stents     . Right middle finger amputation        Home Meds: Prior to Admission medications   Medication Sig Start Date End Date Taking? Authorizing Provider  acetaminophen (TYLENOL) 500 MG tablet Take 1,000 mg by mouth every 4 (four) hours as needed for pain. Pain   Yes Historical Provider, MD  aspirin 81 MG tablet Take 81 mg by mouth daily.    Yes Historical Provider, MD  atorvastatin (LIPITOR) 20 MG tablet Take 20 mg by mouth every morning.   Yes Historical Provider, MD  clopidogrel (PLAVIX) 75 MG tablet Take 75 mg by mouth every morning.   Yes Historical Provider, MD  metoprolol succinate (TOPROL-XL) 50 MG 24 hr tablet Take 25 mg by mouth every morning. Take with or immediately following a meal.   Yes Historical  Provider, MD  pantoprazole (PROTONIX) 40 MG tablet Take 40 mg by mouth daily.   Yes Historical Provider, MD  tamsulosin (FLOMAX) 0.4 MG CAPS Take 0.4 mg by mouth daily after breakfast.   Yes Historical Provider, MD  nitroGLYCERIN (NITROSTAT) 0.4 MG SL tablet Place 0.4 mg under the tongue every 5 (five) minutes as needed for chest pain.    Historical Provider, MD    Inpatient Medications:  . acetaminophen  1,000 mg Oral Q6H   Or  . acetaminophen  650 mg Rectal Q6H  . atorvastatin  20 mg Oral q morning - 10a  . dexamethasone  10 mg Oral Daily   Or  . dexamethasone  10 mg Intravenous Daily  . docusate sodium  100 mg Oral BID  . metoprolol succinate  25 mg Oral q morning - 10a  . pantoprazole  40 mg Oral Daily  . rivaroxaban  10 mg Oral Q breakfast  . tamsulosin  0.4 mg Oral Daily   . dextrose 5 % and 0.9% NaCl 100 mL (07/12/13 0629)    Allergies:  Allergies  Allergen Reactions  . Codeine Nausea And Vomiting    History   Social History  . Marital Status: Married    Spouse Name: N/A    Number of Children: 1  . Years of Education: N/A   Occupational History  .  Not on file.   Social History Main Topics  . Smoking status: Never Smoker   . Smokeless tobacco: Never Used  . Alcohol Use: 3.0 oz/week    5 Glasses of wine per week  . Drug Use: No  . Sexually Active: Not on file   Other Topics Concern  . Not on file   Social History Narrative   Artist, makes furniture.     Family History  Problem Relation Age of Onset  . Coronary artery disease Father     "Hardening of the arteries"       Physical Exam: Blood pressure 95/43, pulse 86, temperature 98.3 F (36.8 C), temperature source Oral, resp. rate 14, height 5\' 9"  (1.753 m), weight 172 lb 9.6 oz (78.291 kg), SpO2 99.00%. General: Well developed, well nourished, alert and oriented, in no acute distress. HEENT: Normocephalic, atraumatic, sclera non-icteric,     Neck: Supple. Carotids 2+ without bruits. JVP  normal Lungs: Clear bilaterally to auscultation without wheezes, rales, or rhonchi. Breathing is unlabored. Heart: RRR with normal S1 and S2. No murmurs, rubs  Abdomen: Soft, non-tender, non-distended with normoactive bowel sounds. No hepatomegaly. No rebound/guarding. No obvious abdominal masses. Back: No CVA tenderness Msk:  Strength and tone appear normal for age. Extremities: The right leg is wrapped. There is no left leg edema.  Distal pedal pulses are 2+ and equal bilaterally. Neuro: CNII-XII intact, moves all extremities spontaneously. Psych:  Responds to questions appropriately with a normal affect.    Labs:  Recent Labs  07/11/13 1910 07/12/13 0058  TROPONINI <0.30 <0.30   Lab Results  Component Value Date   WBC 7.0 07/12/2013   HGB 8.2* 07/12/2013   HCT 24.1* 07/12/2013   MCV 97.2 07/12/2013   PLT PLATELET CLUMPS NOTED ON SMEAR, COUNT APPEARS DECREASED 07/12/2013     Recent Labs Lab 07/06/13 0920 07/12/13 0058  NA 134* 136  K 4.7 4.1  CL 97 103  CO2 30 30  BUN 15 10  CREATININE 0.86 0.85  CALCIUM 9.9 8.1*  PROT 7.0  --   BILITOT 0.4  --   ALKPHOS 66  --   ALT 15  --   AST 15  --   GLUCOSE 96 134*   Lab Results  Component Value Date   CHOL 157 02/24/2013   HDL 59.80 02/24/2013   LDLCALC 79 02/24/2013   TRIG 90.0 02/24/2013   No results found for this basename: DDIMER    Radiology/Studies:  Dg Chest 2 View  07/06/2013   *RADIOLOGY REPORT*  Clinical Data: Hypertension.  CHEST - 2 VIEW  Comparison: January 21, 2012.  Findings: Cardiomediastinal silhouette appears normal.  No acute pulmonary disease is noted.  Bony thorax is intact.  IMPRESSION: No acute cardiopulmonary abnormality seen.   Original Report Authenticated By: Lupita Raider.,  M.D.    EKG: Normal sinus rhythm, within normal limits. Heart rate 66 beats per minute.  ASSESSMENT AND PLAN:  1. Bradycardia and hypotension. His bradycardia has resolved.  His hypotension is most likely related to  blood loss anemia due to surgery.  His knee drain is bloody.  Cardiac enzymes are negative.  No evidence of ACS.   2. Coronary arthrosclerosis, native vessel. Stable without anginal symptoms. As above, doubt acute coronary event. Should resume antiplatelet therapy when okay from a postoperative perspective.    Vesta Mixer, Montez Hageman., MD, [facility] 07/12/2013, 8:13 AM Office - [phone] Pager [phone]

## 2013-07-12 NOTE — Evaluation (Signed)
Occupational Therapy Evaluation Patient Details Name: Peter Morgan MRN: [id number] DOB: [birth year] Today's Date: 07/12/2013 Time: 4098-1191 OT Time Calculation (min): 21 min  OT Assessment / Plan / Recommendation History of present illness Pt admitted for L TKA7/28/14.  .  Transferred to stepdown yesterday due to his bradycardia and hypotension.  Blood pressures are still low today.  HGB noted to have dropped to 8.2, down from 13.1 preop.     Clinical Impression   Pt was admitted for L TKA.  At time of eval, he was seen in SDU due to bradycardia/hypotension.  Will follow in acute for OT to increase safety and independence with bathroom transfers and adls.  Goals are supervision level.      OT Assessment       Follow Up Recommendations  No OT follow up (likely)    Barriers to Discharge      Equipment Recommendations  3 in 1 bedside comode    Recommendations for Other Services    Frequency       Precautions / Restrictions Precautions Precautions: Knee Required Braces or Orthoses: Knee Immobilizer - Left   Pertinent Vitals/Pain *L knee pain---not rated.  Premedicated and repositioned.  Pt also nauseas--cool washcloth given.  Stated he had something for nausea awhile ago.    ADL  Grooming: Set up Where Assessed - Grooming: Unsupported sitting Upper Body Bathing: Set up Where Assessed - Upper Body Bathing: Unsupported sitting Lower Body Bathing: +2 Total assistance Lower Body Bathing: Patient Percentage: 50% Where Assessed - Lower Body Bathing: Supported sit to stand Upper Body Dressing: Minimal assistance Where Assessed - Upper Body Dressing: Unsupported sitting Lower Body Dressing: +2 Total assistance Lower Body Dressing: Patient Percentage: 30% Where Assessed - Lower Body Dressing: Supported sit to stand Toilet Transfer: Simulated;+2 Total assistance Toilet Transfer: Patient Percentage: 70% Statistician Method: Surveyor, minerals:  (bed to  chair) Toileting - Architect and Hygiene: +2 Total assistance Toileting - Clothing Manipulation and Hygiene: Patient Percentage: 50% Where Assessed - Toileting Clothing Manipulation and Hygiene: Sit to stand from 3-in-1 or toilet Equipment Used: Rolling walker Transfers/Ambulation Related to ADLs: SPT only, to chair.  Pt became nauseas and had pain after transfer ADL Comments: Wife will assist pt with adls.  Limited today as he was receiving 2nd unit of blood during eval    OT Diagnosis:    OT Problem List:   OT Treatment Interventions:     OT Goals(Current goals can be found in the care plan section) Acute Rehab OT Goals OT Goal Formulation: With patient Time For Goal Achievement: 07/19/13 Potential to Achieve Goals: Good ADL Goals Pt Will Perform Grooming: with supervision;standing Pt Will Transfer to Toilet: with supervision;ambulating;bedside commode Pt Will Perform Toileting - Clothing Manipulation and hygiene: with supervision;sit to/from stand Pt Will Perform Tub/Shower Transfer: with supervision;ambulating;3 in 1  Visit Information  Last OT Received On: 07/12/13 Assistance Needed: +2 PT/OT Co-Evaluation/Treatment: Yes History of Present Illness: Pt admitted for L TKA7/28/14.  .  Transferred to stepdown yesterday due to his bradycardia and hypotension.  Blood pressures are still low today.  HGB noted to have dropped to 8.2, down from 13.1 preop.         Prior Functioning     Home Living Family/patient expects to be discharged to:: Private residence Living Arrangements: Spouse/significant other Type of Home: House Home Access: Stairs to enter [employer] of Steps: 3 Entrance Stairs-Rails: None (uneven path to stairs) Home Layout: Two level Home  Equipment: Gilmer Mor - single point;Crutches (crutches are old) Additional Comments: has a bed and bath downstairs Prior Function Level of Independence: Independent         Vision/Perception      Cognition  Cognition Arousal/Alertness: Awake/alert Behavior During Therapy: WFL for tasks assessed/performed;Anxious Overall Cognitive Status: Within Functional Limits for tasks assessed    Extremity/Trunk Assessment       Mobility Bed Mobility Bed Mobility: Supine to Sit Supine to Sit: 4: Min assist;HOB elevated Details for Bed Mobility Assistance: assist for LLE Transfers Transfers: Sit to Stand Sit to Stand: 1: +2 Total assist Sit to Stand: Patient Percentage: 70% Details for Transfer Assistance: cues to keep R wrist straight due to blood running.  Cues for LUE and leg position     Exercise     Balance     End of Session OT - End of Session Activity Tolerance: Patient limited by pain (and nausea) Patient left: in chair;with call bell/phone within reach;with family/visitor present  GO     Rocky Rishel 07/12/2013, 4:18 PM Marica Otter, OTR/L [phone] 07/12/2013

## 2013-07-12 NOTE — Progress Notes (Signed)
Patient alert and oriented, able to follow commands and converse appropriately.  BP 87/44; HR 86, nsr.   Dr. Ulis Rias notified of blood pressure.  New orders given.  Will continue to monitor.  Kinnie Feil, RN

## 2013-07-12 NOTE — Evaluation (Addendum)
Physical Therapy Evaluation Patient Details Name: Peter Morgan MRN: [id number] DOB: 1946/05/27 Today's Date: 07/12/2013 Time: 1308-6578 PT Time Calculation (min): 54 min  PT Assessment / Plan / Recommendation History of Present Illness  Pt admitted for L TKA7/28/14.  .  Transferred to stepdown yesterday due to his bradycardia and hypotension.  Blood pressures are still low today.  HGB noted to have dropped to 8.2, down from 13.1 preop.    Clinical Impression  Pt was feeling well at beginning of session. After getting into chair , pt began to have increased pain, nausea, dizziness. Pt assisted back to bed. Attempted exercises but pt in too much pain. Assisted back into bed. Pt will benefit from PT while in acute care.    PT Assessment  Patient needs continued PT services    Follow Up Recommendations  Home health PT    Does the patient have the potential to tolerate intense rehabilitation      Barriers to Discharge        Equipment Recommendations  Rolling walker with 5" wheels    Recommendations for Other Services     Frequency 7X/week    Precautions / Restrictions Precautions Precautions: Knee Required Braces or Orthoses: Knee Immobilizer - Left   Pertinent Vitals/Pain  Pain initially 4, increased to 9, RN in to give IV meds.  BP  132/69  after return to bed 171/96 RN aware.     Mobility  Bed Mobility Bed Mobility: Sit to Supine Supine to Sit: 4: Min assist;HOB elevated Sit to Supine: 3: Mod assist Details for Bed Mobility Assistance: assist for LLE Transfers Sit to Stand: 1: +2 Total assist Sit to Stand: Patient Percentage: 70% Details for Transfer Assistance: cues to keep R wrist straight due to blood running.  Cues for LUE and leg position Ambulation/Gait Ambulation/Gait Assistance: 1: +2 Total assist Ambulation/Gait: Patient Percentage: 60% Ambulation Distance (Feet): 5 Feet Assistive device: Rolling walker Ambulation/Gait Assistance Details: Pt did not  place weight on LLE during transfers. Pt only tolerated OOB x 5 minutes with increased pain and nausea and dizziness.  Gait Pattern: Step-to pattern;Decreased stance time - left    Exercises Total Joint Exercises Ankle Circles/Pumps: AROM;Both;10 reps Quad Sets: AROM;Both;10 reps Straight Leg Raises: AROM;Both;10 reps   PT Diagnosis: Difficulty walking;Acute pain  PT Problem List: Decreased strength;Decreased range of motion;Decreased activity tolerance;Decreased mobility;Cardiopulmonary status limiting activity;Decreased knowledge of precautions;Decreased safety awareness;Decreased knowledge of use of DME;Pain PT Treatment Interventions: DME instruction;Gait training;Stair training;Functional mobility training;Therapeutic activities;Therapeutic exercise;Patient/family education     PT Goals(Current goals can be found in the care plan section) Acute Rehab PT Goals Patient Stated Goal: I want to get up. PT Goal Formulation: With patient/family Time For Goal Achievement: 07/19/13 Potential to Achieve Goals: Good  Visit Information  Last PT Received On: 07/12/13 Assistance Needed: +2 History of Present Illness: Pt admitted for L TKA7/28/14.  .  Transferred to stepdown yesterday due to his bradycardia and hypotension.  Blood pressures are still low today.  HGB noted to have dropped to 8.2, down from 13.1 preop.         Prior Functioning  Home Living Family/patient expects to be discharged to:: Private residence Living Arrangements: Spouse/significant other Type of Home: House Home Access: Stairs to enter Secretary/administrator of Steps: 3 Entrance Stairs-Rails: None (uneven path to stairs) Home Layout: Two level Home Equipment: Cane - single point;Crutches (crutches are old) Additional Comments: has a bed and bath downstairs Prior Function Level of Independence: Independent  Cognition  Cognition Arousal/Alertness: Awake/alert Behavior During Therapy: WFL for tasks  assessed/performed;Anxious Overall Cognitive Status: Within Functional Limits for tasks assessed    Extremity/Trunk Assessment Upper Extremity Assessment Upper Extremity Assessment: Defer to OT evaluation Lower Extremity Assessment Lower Extremity Assessment: LLE deficits/detail LLE Deficits / Details: pt unable to tolerate any knee flexion, performed a few SLR with assistance.  LLE: Unable to fully assess due to pain   Balance    End of Session PT - End of Session Equipment Utilized During Treatment: Left knee immobilizer Activity Tolerance: Patient limited by pain;Treatment limited secondary to medical complications (Comment) Patient left: in bed;with call bell/phone within reach;with family/visitor present Nurse Communication: Mobility status;Patient requests pain meds  GP     [name] 07/12/2013, 4:29 PM  Blanchard Kelch PT [phone]

## 2013-07-12 NOTE — Progress Notes (Signed)
Subjective: 1 Day Post-Op Procedure(s) (LRB): LEFT TOTAL KNEE ARTHROPLASTY  (Left) Patient reports pain as mild.   Patient seen in rounds with Dr. Lequita Halt.  Transferred to stepdown yesterday due to his bradycardia and hypotension.  Blood pressures are still low today.  HGB noted to have dropped to 8.2, down from 13.1 preop.  Will give blood today.  Hold therapy until he gets at least one unit of blood.  Greatly appreciate Cardiology consultation.  Patient has a fair amount of oozing at surgery and expected to have postop being on Plavix preop and not being able to use TXA prior to surgery.  Left the HV drain for one more day due to the postop bleeding. Patient is having problems with hypotension and pain in the knee, requiring pain medications We will start therapy today.  Plan is to go Home after hospital stay.  Objective: Vital signs in last 24 hours: Temp:  [96.5 F (35.8 C)-98.5 F (36.9 C)] 98.3 F (36.8 C) (07/29 0339) Pulse Rate:  [61-88] 86 (07/29 0800) Resp:  [8-18] 14 (07/29 0800) BP: (67-135)/(22-92) 95/43 mmHg (07/29 0800) SpO2:  [95 %-100 %] 99 % (07/29 0800) Weight:  [78.291 kg (172 lb 9.6 oz)] 78.291 kg (172 lb 9.6 oz) (07/28 1109)  Intake/Output from previous day:  Intake/Output Summary (Last 24 hours) at 07/12/13 0845 Last data filed at 07/12/13 0600  Gross per 24 hour  Intake   3935 ml  Output   1950 ml  Net   1985 ml    Intake/Output this shift: UOP 325 since MN +1985  Labs:  Recent Labs  07/12/13 0058  HGB 8.2*    Recent Labs  07/12/13 0058  WBC 7.0  RBC 2.48*  HCT 24.1*  PLT PLATELET CLUMPS NOTED ON SMEAR, COUNT APPEARS DECREASED    Recent Labs  07/12/13 0058  NA 136  K 4.1  CL 103  CO2 30  BUN 10  CREATININE 0.85  GLUCOSE 134*  CALCIUM 8.1*   No results found for this basename: LABPT, INR,  in the last 72 hours  EXAM General - Patient is Alert, Appropriate and Oriented Extremity - Neurovascular intact Sensation intact  distally Dorsiflexion/Plantar flexion intact Dressing - dressing C/D/I Motor Function - intact, moving foot and toes well on exam.  Hemovac will be left in until tomorrow.  Past Medical History  Diagnosis Date  . Hyperlipidemia   . BPH (benign prostatic hyperplasia)   . Arthritis   . CAD (coronary artery disease)     Diagnosed 12/2011 following abnormal treadmill test - high grade OM stenosis s/p DES 01/05/12, mild residual non-obstructive LAD, Lcx, RCA disease.  Normal LV function with EF 55-65% by cath 01/05/12  . Hypertension   . GERD (gastroesophageal reflux disease)     Assessment/Plan: 1 Day Post-Op Procedure(s) (LRB): LEFT TOTAL KNEE ARTHROPLASTY  (Left) Principal Problem:   OA (osteoarthritis) of knee Active Problems:   Bradycardia   Hypotension, unspecified   Postoperative anemia due to acute blood loss   Postop Transfusion  Estimated body mass index is 25.48 kg/(m^2) as calculated from the following:   Height as of this encounter: 5\' 9"  (1.753 m).   Weight as of this encounter: 78.291 kg (172 lb 9.6 oz). Advance diet Up with therapy Discharge home with home health Blood transfusion today  DVT Prophylaxis - Xarelto Weight-Bearing as tolerated to left leg No vaccines. D/C O2 and Pulse OX and try on Room Air  PERKINS, ALEXZANDREW 07/12/2013, 8:45 AM

## 2013-07-13 DIAGNOSIS — I959 Hypotension, unspecified: Secondary | ICD-10-CM

## 2013-07-13 LAB — CBC
MCH: 31.3 pg (ref 26.0–34.0)
MCHC: 33.5 g/dL (ref 30.0–36.0)
MCV: 93.6 fL (ref 78.0–100.0)
Platelets: 78 10*3/uL — ABNORMAL LOW (ref 150–400)

## 2013-07-13 LAB — BASIC METABOLIC PANEL
Calcium: 8.7 mg/dL (ref 8.4–10.5)
Creatinine, Ser: 0.82 mg/dL (ref 0.50–1.35)
GFR calc non Af Amer: 89 mL/min — ABNORMAL LOW (ref >90)
Sodium: 136 mEq/L (ref 135–145)

## 2013-07-13 MED ORDER — POLYSACCHARIDE IRON COMPLEX 150 MG PO CAPS
150.0000 mg | ORAL_CAPSULE | Freq: Every day | ORAL | Status: DC
  Administered 2013-07-13 – 2013-07-15 (×3): 150 mg via ORAL
  Filled 2013-07-13 (×3): qty 1

## 2013-07-13 NOTE — Progress Notes (Signed)
Occupational Therapy Treatment Patient Details Name: Peter Morgan MRN: [id number] DOB: September 04, 1946 Today's Date: 07/13/2013 Time: 4098-1191 OT Time Calculation (min): 20 min  OT Assessment / Plan / Recommendation  History of present illness     OT comments  Pt is progressing very well  Limited by pain  Follow Up Recommendations  No OT follow up    Barriers to Discharge       Equipment Recommendations  3 in 1 bedside comode    Recommendations for Other Services    Frequency Min 2X/week   Progress towards OT Goals Progress towards OT goals: Progressing toward goals  Plan      Precautions / Restrictions Precautions Precautions: Knee Precaution Comments: monitor BP Restrictions LLE Weight Bearing: Weight bearing as tolerated   Pertinent Vitals/Pain 6/10 initially.  10/10 after tx.  Pain medicine due shortly.  Muscle relaxant was given.  Repositioned with ice    ADL  Toilet Transfer: Minimal assistance (assist with LLE) Toilet Transfer Method: Sit to stand Toilet Transfer Equipment: Raised toilet seat with arms (or 3-in-1 over toilet) Tub/Shower Transfer: Performed;Min guard Tub/Shower Transfer Method: Science writer: Walk in shower Transfers/Ambulation Related to ADLs: practiced commode transfer twice.  Pt needed assist to support LLE due to toilet height ADL Comments: Pt would like to practice shower again tomorrow.  Wife will measure toilet at home.  Pt would like to be able to manage this independently.    OT Diagnosis:    OT Problem List:   OT Treatment Interventions:     OT Goals(current goals can now be found in the care plan section)    Visit Information  Last OT Received On: 07/13/13 Assistance Needed: +1    Subjective Data      Prior Functioning       Cognition  Cognition Arousal/Alertness: Awake/alert Behavior During Therapy: [facility] for tasks assessed/performed;Anxious Overall Cognitive Status: Within Functional Limits  for tasks assessed    Mobility  Bed Mobility Bed Mobility: Supine to Sit Supine to Sit: 4: Min guard Details for Bed Mobility Assistance: pt is able to move to edge of bed with no support. Transfers Sit to Stand: 4: Min guard;From chair/3-in-1;With armrests Stand to Sit: 4: Min guard;To chair/3-in-1;With upper extremity assist Details for Transfer Assistance: cues for leg placement    Exercises     Balance     End of Session OT - End of Session Activity Tolerance: Patient limited by pain Patient left: in chair;with call bell/phone within reach;with family/visitor present  GO     Marcy Sookdeo 07/13/2013, 3:18 PM Marica Otter, OTR/L [phone] 07/13/2013

## 2013-07-13 NOTE — Progress Notes (Signed)
Subjective: 2 Days Post-Op Procedure(s) (LRB): LEFT TOTAL KNEE ARTHROPLASTY  (Left) Patient reports pain as mild and moderate.   Patient seen in rounds for Dr. Lequita Halt.  He is still hurting today but better than yesterday. He feels better overall since the blood yesterday.  HGB is back up to 8.8. Will continue to monitor for symptoms.  If still has any sings or symptoms, will give another unit, especially due to the known heart disease.  Appreciate Cardiology assistance with this nice patient.   EKG: Normal sinus rhythm, within normal limits. Heart rate 66 beats per minute.  ASSESSMENT AND PLAN:  1. Bradycardia and hypotension. His bradycardia has resolved. His hypotension is most likely related to blood loss anemia due to surgery. His knee drain is bloody. Cardiac enzymes are negative. No evidence of ACS. His HR and BP are now stable  2. Coronary arthrosclerosis, native vessel. Stable without anginal symptoms. As above, doubt acute coronary event. Should resume antiplatelet therapy when okay from a postoperative perspective.  Will sign off. Call for questions. Will DC telemetry now and transfer back up to the 6th floor.  Patient is well, but has had some minor complaints of pain in the knee, requiring pain medications Plan is to go Home after hospital stay.  Objective: Vital signs in last 24 hours: Temp:  [97.7 F (36.5 C)-98.6 F (37 C)] 98 F (36.7 C) (07/30 0800) Pulse Rate:  [75-103] 90 (07/30 0939) Resp:  [12-20] 17 (07/30 0939) BP: (100-174)/(47-96) 119/47 mmHg (07/30 0939) SpO2:  [93 %-100 %] 99 % (07/30 0939)  Intake/Output from previous day:  Intake/Output Summary (Last 24 hours) at 07/13/13 1103 Last data filed at 07/13/13 0900  Gross per 24 hour  Intake 2495.83 ml  Output   2400 ml  Net  95.83 ml    Intake/Output this shift: Total I/O In: 200 [I.V.:200] Out: 200 [Urine:200]  Labs:  Recent Labs  07/12/13 0058 07/13/13 0304  HGB 8.2* 8.8*    Recent Labs  07/12/13 0058 07/13/13 0304  WBC 7.0 11.7*  RBC 2.48* 2.81*  HCT 24.1* 26.3*  PLT PLATELET CLUMPS NOTED ON SMEAR, COUNT APPEARS DECREASED 78*    Recent Labs  07/12/13 0058 07/13/13 0304  NA 136 136  K 4.1 4.5  CL 103 104  CO2 30 29  BUN 10 9  CREATININE 0.85 0.82  GLUCOSE 134* 153*  CALCIUM 8.1* 8.7   No results found for this basename: LABPT, INR,  in the last 72 hours  EXAM General - Patient is Alert, Appropriate and Oriented Extremity - Neurovascular intact Sensation intact distally Dorsiflexion/Plantar flexion intact No cellulitis present Noted thigh swelling. Dressing/Incision - clean, dry, healing, swelling noted intraarticular but also appears to be fair amount extraarticular. Motor Function - intact, moving foot and toes well on exam.  Hemovac came out and laying in the bed.  Past Medical History  Diagnosis Date  . Hyperlipidemia   . BPH (benign prostatic hyperplasia)   . Arthritis   . CAD (coronary artery disease)     Diagnosed 12/2011 following abnormal treadmill test - high grade OM stenosis s/p DES 01/05/12, mild residual non-obstructive LAD, Lcx, RCA disease.  Normal LV function with EF 55-65% by cath 01/05/12  . Hypertension   . GERD (gastroesophageal reflux disease)     Assessment/Plan: 2 Days Post-Op Procedure(s) (LRB): LEFT TOTAL KNEE ARTHROPLASTY  (Left) Principal Problem:   OA (osteoarthritis) of knee Active Problems:   Bradycardia   Hypotension, unspecified   Postoperative  anemia due to acute blood loss   Postop Transfusion  Estimated body mass index is 25.48 kg/(m^2) as calculated from the following:   Height as of this encounter: 5\' 9"  (1.753 m).   Weight as of this encounter: 78.291 kg (172 lb 9.6 oz). Advance diet Up with therapy  DVT Prophylaxis - Xarelto Weight-Bearing as tolerated to left leg DC telemetry Transfer to 6th floor. Continue total knee protocol.  PERKINS, ALEXZANDREW 07/13/2013, 11:03 AM

## 2013-07-13 NOTE — Progress Notes (Signed)
Physical Therapy Treatment Patient Details Name: Peter Morgan MRN: [id number] DOB: June 06, 1946 Today's Date: 07/13/2013 Time: 4098-1191 PT Time Calculation (min): 26 min  PT Assessment / Plan / Recommendation  History of Present Illness     PT Comments   Pt having pain control issues , due meds now. Not able to ambulate. Repositioned, ice applied. Given robaxin and pain meds.   Follow Up Recommendations  Home health PT     Does the patient have the potential to tolerate intense rehabilitation     Barriers to Discharge        Equipment Recommendations  Rolling walker with 5" wheels    Recommendations for Other Services    Frequency 7X/week   Progress towards PT Goals Progress towards PT goals: Progressing toward goals  Plan Current plan remains appropriate    Precautions / Restrictions Precautions Precautions: Knee Precaution Comments: monitor BP Restrictions LLE Weight Bearing: Weight bearing as tolerated   Pertinent Vitals/Pain 8-9.L knee.    Mobility  Bed Mobility Bed Mobility: Sit to Supine Supine to Sit: 4: Min guard Sit to Supine: 3: Mod assist Details for Bed Mobility Assistance: placing LLE onto bed. Transfers Transfers: Stand Pivot Transfers Sit to Stand: 4: Min guard;From chair/3-in-1;With upper extremity assist Stand to Sit: To bed;With upper extremity assist;4: Min guard Stand Pivot Transfers: 4: Min assist Details for Transfer Assistance: cues for leg placement, unable to ambulate due to pain. Ambulation/Gait Ambulation/Gait Assistance: Not tested (comment) Ambulation Distance (Feet): 175 Feet Assistive device: Rolling walker Ambulation/Gait Assistance Details: cues for sequence and to place weight through L leg. Pt gradually able to place more weight Gait Pattern: Step-to pattern;Step-through pattern;Decreased stance time - right;Decreased stance time - left    Exercises Total Joint Exercises Quad Sets: AROM;Both;15 reps Heel Slides:  AAROM;Left;15 reps Hip ABduction/ADduction: AAROM;Left;15 reps Straight Leg Raises: AAROM;Left;15 reps   PT Diagnosis:    PT Problem List:   PT Treatment Interventions:     PT Goals (current goals can now be found in the care plan section)    Visit Information  Last PT Received On: 07/13/13 Assistance Needed: +1    Subjective Data      Cognition  Cognition Arousal/Alertness: Awake/alert Behavior During Therapy: [facility] for tasks assessed/performed;Anxious Overall Cognitive Status: Within Functional Limits for tasks assessed    Balance     End of Session PT - End of Session Equipment Utilized During Treatment: Left knee immobilizer Activity Tolerance: Patient limited by pain Patient left: in bed;with call bell/phone within reach Nurse Communication: Patient requests pain meds   GP     Peter Morgan 07/13/2013, 3:29 PM

## 2013-07-13 NOTE — Progress Notes (Signed)
Physical Therapy Treatment Patient Details Name: Peter Morgan MRN: [id number] DOB: 1946/01/11 Today's Date: 07/13/2013 Time: 1478-2956 PT Time Calculation (min): 32 min  PT Assessment / Plan / Recommendation  History of Present Illness     PT Comments   Pt tolerated ambulating well x 175 '.  Follow Up Recommendations  Home health PT     Does the patient have the potential to tolerate intense rehabilitation     Barriers to Discharge        Equipment Recommendations  Rolling walker with 5" wheels    Recommendations for Other Services    Frequency 7X/week   Progress towards PT Goals Progress towards PT goals: Progressing toward goals  Plan Current plan remains appropriate    Precautions / Restrictions Precautions Precautions: Knee Precaution Comments: monitor BP   Pertinent Vitals/Pain Pt stats pain <4    Mobility  Bed Mobility Bed Mobility: Supine to Sit Supine to Sit: 4: Min guard Details for Bed Mobility Assistance: pt is able to move to edge of bed with no support. Transfers Transfers: Sit to Stand;Stand to Sit Sit to Stand: 4: Min guard;From bed;With upper extremity assist Stand to Sit: 4: Min guard;To chair/3-in-1;With upper extremity assist Details for Transfer Assistance: cues for hand placement and Lleg position. Ambulation/Gait Ambulation/Gait Assistance: 4: Min assist Ambulation Distance (Feet): 175 Feet Assistive device: Rolling walker Ambulation/Gait Assistance Details: cues for sequence and to place weight through L leg. Pt gradually able to place more weight Gait Pattern: Step-to pattern;Step-through pattern;Decreased stance time - right;Decreased stance time - left    Exercises Total Joint Exercises Quad Sets: AROM;Both;15 reps Heel Slides: AAROM;Left;15 reps Hip ABduction/ADduction: AAROM;Left;15 reps Straight Leg Raises: AAROM;Left;15 reps   PT Diagnosis:    PT Problem List:   PT Treatment Interventions:     PT Goals (current goals can  now be found in the care plan section)    Visit Information  Last PT Received On: 07/13/13 Assistance Needed: +1    Subjective Data      Cognition  Cognition Arousal/Alertness: Awake/alert    Balance     End of Session PT - End of Session Equipment Utilized During Treatment: Left knee immobilizer Activity Tolerance: Patient tolerated treatment well Patient left: with call bell/phone within reach;with family/visitor present Nurse Communication: Mobility status   GP     [name] 07/13/2013, 1:36 PM

## 2013-07-13 NOTE — Progress Notes (Signed)
Physical Therapy Treatment Patient Details Name: Peter Morgan MRN: [id number] DOB: Mar 06, 1946 Today's Date: 07/13/2013 Time: 4098-1191 PT Time Calculation (min): 20 min  PT Assessment / Plan / Recommendation  History of Present Illness     PT Comments   Pt appears feeling much better. Has less pain  This AM.  Follow Up Recommendations  Home health PT     Does the patient have the potential to tolerate intense rehabilitation     Barriers to Discharge        Equipment Recommendations  Rolling walker with 5" wheels    Recommendations for Other Services    Frequency 7X/week   Progress towards PT Goals Progress towards PT goals: Progressing toward goals  Plan Current plan remains appropriate    Precautions / Restrictions Precautions Precautions: Knee Precaution Comments: monitor BP   Pertinent Vitals/Pain < 4 L knee.    Mobility       Exercises Total Joint Exercises Quad Sets: AROM;Both;15 reps Heel Slides: AAROM;Left;15 reps Hip ABduction/ADduction: AAROM;Left;15 reps Straight Leg Raises: AAROM;Left;15 reps   PT Diagnosis:    PT Problem List:   PT Treatment Interventions:     PT Goals (current goals can now be found in the care plan section)    Visit Information  Last PT Received On: 07/13/13 Assistance Needed: +1    Subjective Data      Cognition  Cognition Arousal/Alertness: Awake/alert    Balance     End of Session PT - End of Session Activity Tolerance: Patient tolerated treatment well Patient left: in bed;with call bell/phone within reach;with family/visitor present   GP     Peter Morgan 07/13/2013, 1:30 PM

## 2013-07-13 NOTE — Progress Notes (Signed)
CARDIOLOGY CONSULT NOTE  Patient ID: Peter Morgan, MRN: [id number], DOB/AGE: 06/22/1946 67 y.o. Admit date: 07/11/2013 Date of Consult: 07/13/2013  Primary Physician: Enrique Sack, MD Primary Cardiologist: Dr Antoine Poche Referring Physician: Dr Lequita Halt  Chief Complaint: hypotension, bradycardia  HPI: 67 y.o. male w/ PMHx significant for CAD who underwent right total knee arthroplasty yesterday.  he has had some post op hypotension and bradycardia.    His HR is back up to normal.  His BP has remained low - he has had a Hb drop of > 4 gm/dl.  He received 2 units of PRBC with some improvement of his Hb.   Past Medical History  Diagnosis Date  . Hyperlipidemia   . BPH (benign prostatic hyperplasia)   . Arthritis   . CAD (coronary artery disease)     Diagnosed 12/2011 following abnormal treadmill test - high grade OM stenosis s/p DES 01/05/12, mild residual non-obstructive LAD, Lcx, RCA disease.  Normal LV function with EF 55-65% by cath 01/05/12  . Hypertension   . GERD (gastroesophageal reflux disease)       Surgical History:  Past Surgical History  Procedure Laterality Date  . Knee arthroscopy      bilateral  . Appendectomy    . Tonsillectomy and adenoidectomy    . Nasal septum surgery    . Femur surgery    . Coronary stents     . Right middle finger amputation     . Total knee arthroplasty Left 07/11/2013    Procedure: LEFT TOTAL KNEE ARTHROPLASTY ;  Surgeon: Loanne Drilling, MD;  Location: [facility];  Service: Orthopedics;  Laterality: Left;     Home Meds: Prior to Admission medications   Medication Sig Start Date End Date Taking? Authorizing Provider  acetaminophen (TYLENOL) 500 MG tablet Take 1,000 mg by mouth every 4 (four) hours as needed for pain. Pain   Yes Historical Provider, MD  aspirin 81 MG tablet Take 81 mg by mouth daily.    Yes Historical Provider, MD  atorvastatin (LIPITOR) 20 MG tablet Take 20 mg by mouth every morning.   Yes Historical Provider, MD   clopidogrel (PLAVIX) 75 MG tablet Take 75 mg by mouth every morning.   Yes Historical Provider, MD  metoprolol succinate (TOPROL-XL) 50 MG 24 hr tablet Take 25 mg by mouth every morning. Take with or immediately following a meal.   Yes Historical Provider, MD  pantoprazole (PROTONIX) 40 MG tablet Take 40 mg by mouth daily.   Yes Historical Provider, MD  tamsulosin (FLOMAX) 0.4 MG CAPS Take 0.4 mg by mouth daily after breakfast.   Yes Historical Provider, MD  nitroGLYCERIN (NITROSTAT) 0.4 MG SL tablet Place 0.4 mg under the tongue every 5 (five) minutes as needed for chest pain.    Historical Provider, MD    Inpatient Medications:  . acetaminophen  1,000 mg Oral Q6H   Or  . acetaminophen  650 mg Rectal Q6H  . acetaminophen  650 mg Oral Once  . atorvastatin  20 mg Oral q morning - 10a  . docusate sodium  100 mg Oral BID  . metoprolol succinate  25 mg Oral q morning - 10a  . pantoprazole  40 mg Oral Daily  . rivaroxaban  10 mg Oral Q breakfast  . tamsulosin  0.4 mg Oral Daily   . dextrose 5 % and 0.9% NaCl 100 mL/hr at 07/12/13 0700    Allergies:  Allergies  Allergen Reactions  . Codeine Nausea And Vomiting  History   Social History  . Marital Status: Married    Spouse Name: N/A    Number of Children: 1  . Years of Education: N/A   Occupational History  . Not on file.   Social History Main Topics  . Smoking status: Never Smoker   . Smokeless tobacco: Never Used  . Alcohol Use: 3.0 oz/week    5 Glasses of wine per week  . Drug Use: No  . Sexually Active: Not on file   Other Topics Concern  . Not on file   Social History Narrative   Artist, makes furniture.     Family History  Problem Relation Age of Onset  . Coronary artery disease Father     "Hardening of the arteries"       Physical Exam: Blood pressure 107/49, pulse 75, temperature 97.7 F (36.5 C), temperature source Oral, resp. rate 14, height 5\' 9"  (1.753 m), weight 172 lb 9.6 oz (78.291 kg), SpO2  94.00%. General: Well developed, well nourished, alert and oriented, in no acute distress. HEENT: Normocephalic, atraumatic, sclera non-icteric,     Neck: Supple. Carotids 2+ without bruits. JVP normal Lungs: Clear bilaterally to auscultation without wheezes, rales, or rhonchi. Breathing is unlabored. Heart: RR with normal S1 and S2.   Abdomen: Soft, non-tender, non-distended with normoactive bowel sounds. No hepatomegaly. No rebound/guarding. No obvious abdominal masses. Back: No CVA tenderness Msk:  Strength and tone appear normal for age. Extremities: The right leg is wrapped. There is no left leg edema.  Distal pedal pulses are 2+ and equal bilaterally. Neuro: CNII-XII intact, moves all extremities spontaneously. Psych:  Responds to questions appropriately with a normal affect.    Labs:  Recent Labs  07/11/13 1910 07/12/13 0058 07/12/13 0740  TROPONINI <0.30 <0.30 <0.30   Lab Results  Component Value Date   WBC 11.7* 07/13/2013   HGB 8.8* 07/13/2013   HCT 26.3* 07/13/2013   MCV 93.6 07/13/2013   PLT 78* 07/13/2013     Recent Labs Lab 07/06/13 0920  07/13/13 0304  NA 134*  < > 136  K 4.7  < > 4.5  CL 97  < > 104  CO2 30  < > 29  BUN 15  < > 9  CREATININE 0.86  < > 0.82  CALCIUM 9.9  < > 8.7  PROT 7.0  --   --   BILITOT 0.4  --   --   ALKPHOS 66  --   --   ALT 15  --   --   AST 15  --   --   GLUCOSE 96  < > 153*  < > = values in this interval not displayed. Lab Results  Component Value Date   CHOL 157 02/24/2013   HDL 59.80 02/24/2013   LDLCALC 79 02/24/2013   TRIG 90.0 02/24/2013   No results found for this basename: DDIMER    Radiology/Studies:  Dg Chest 2 View  07/06/2013   *RADIOLOGY REPORT*  Clinical Data: Hypertension.  CHEST - 2 VIEW  Comparison: January 21, 2012.  Findings: Cardiomediastinal silhouette appears normal.  No acute pulmonary disease is noted.  Bony thorax is intact.  IMPRESSION: No acute cardiopulmonary abnormality seen.   Original Report  Authenticated By: Lupita Raider.,  M.D.    EKG: Normal sinus rhythm, within normal limits. Heart rate 66 beats per minute.  ASSESSMENT AND PLAN:  1. Bradycardia and hypotension. His bradycardia has resolved.  His hypotension is most likely related to  blood loss anemia due to surgery.  His knee drain is bloody.  Cardiac enzymes are negative.  No evidence of ACS.  His HR and BP are now stable  2. Coronary arthrosclerosis, native vessel. Stable without anginal symptoms. As above, doubt acute coronary event. Should resume antiplatelet therapy when okay from a postoperative perspective.  Will sign off.  Call for questions.     Vesta Mixer, Montez Hageman., MD, [facility] 07/13/2013, 7:44 AM Office - [phone] Pager [phone]

## 2013-07-14 LAB — CBC
MCH: 32 pg (ref 26.0–34.0)
MCV: 94.8 fL (ref 78.0–100.0)
Platelets: 57 10*3/uL — ABNORMAL LOW (ref 150–400)
RBC: 2.31 MIL/uL — ABNORMAL LOW (ref 4.22–5.81)
RDW: 15.1 % (ref 11.5–15.5)
WBC: 9.9 10*3/uL (ref 4.0–10.5)

## 2013-07-14 LAB — BASIC METABOLIC PANEL
CO2: 31 mEq/L (ref 19–32)
Calcium: 9.1 mg/dL (ref 8.4–10.5)
Chloride: 103 mEq/L (ref 96–112)
Creatinine, Ser: 0.92 mg/dL (ref 0.50–1.35)
GFR calc Af Amer: >90 mL/min (ref >90)
Sodium: 137 mEq/L (ref 135–145)

## 2013-07-14 LAB — PREPARE RBC (CROSSMATCH)

## 2013-07-14 MED ORDER — ASPIRIN 81 MG PO TABS: 81.0000 mg | ORAL_TABLET | Freq: Every day | ORAL | Status: DC

## 2013-07-14 MED ORDER — ASPIRIN 81 MG PO CHEW
81.0000 mg | CHEWABLE_TABLET | Freq: Every day | ORAL | Status: DC
  Administered 2013-07-14 – 2013-07-15 (×2): 81 mg via ORAL
  Filled 2013-07-14 (×2): qty 1

## 2013-07-14 MED ORDER — CLOPIDOGREL BISULFATE 75 MG PO TABS
75.0000 mg | ORAL_TABLET | Freq: Every morning | ORAL | Status: DC
  Administered 2013-07-14 – 2013-07-15 (×2): 75 mg via ORAL
  Filled 2013-07-14 (×2): qty 1

## 2013-07-14 NOTE — Progress Notes (Signed)
Utilization review completed.  

## 2013-07-14 NOTE — Progress Notes (Signed)
   Subjective: 3 Days Post-Op Procedure(s) (LRB): LEFT TOTAL KNEE ARTHROPLASTY  (Left) Patient reports pain as mild.   Patient seen in rounds by Dr. Lequita Halt. Patient is well, but has had some minor complaints of pain in the knee, requiring pain medications. HGB has drifted back down again to 7.4.   Will give two more units. Plan is to go Home after hospital stay.  Objective: Vital signs in last 24 hours: Temp:  [98.2 F (36.8 C)-98.8 F (37.1 C)] 98.2 F (36.8 C) (07/31 0623) Pulse Rate:  [80-96] 96 (07/31 0623) Resp:  [14-16] 16 (07/31 0623) BP: (100-148)/(60-71) 100/63 mmHg (07/31 0623) SpO2:  [97 %-100 %] 98 % (07/31 0623)  Intake/Output from previous day:  Intake/Output Summary (Last 24 hours) at 07/14/13 1003 Last data filed at 07/14/13 0819  Gross per 24 hour  Intake   1040 ml  Output    675 ml  Net    365 ml    Intake/Output this shift: Total I/O In: 240 [P.O.:240] Out: 200 [Urine:200]  Labs:  Recent Labs  07/12/13 0058 07/13/13 0304 07/14/13 0420  HGB 8.2* 8.8* 7.4*    Recent Labs  07/13/13 0304 07/14/13 0420  WBC 11.7* 9.9  RBC 2.81* 2.31*  HCT 26.3* 21.9*  PLT 78* 57*    Recent Labs  07/13/13 0304 07/14/13 0420  NA 136 137  K 4.5 4.3  CL 104 103  CO2 29 31  BUN 9 12  CREATININE 0.82 0.92  GLUCOSE 153* 99  CALCIUM 8.7 9.1   No results found for this basename: LABPT, INR,  in the last 72 hours  EXAM General - Patient is Alert, Appropriate and Oriented Extremity - Neurovascular intact Sensation intact distally Dorsiflexion/Plantar flexion intact Dressing/Incision - clean, dry, no drainage, swelling noted Motor Function - intact, moving foot and toes well on exam.   Past Medical History  Diagnosis Date  . Hyperlipidemia   . BPH (benign prostatic hyperplasia)   . Arthritis   . CAD (coronary artery disease)     Diagnosed 12/2011 following abnormal treadmill test - high grade OM stenosis s/p DES 01/05/12, mild residual  non-obstructive LAD, Lcx, RCA disease.  Normal LV function with EF 55-65% by cath 01/05/12  . Hypertension   . GERD (gastroesophageal reflux disease)     Assessment/Plan: 3 Days Post-Op Procedure(s) (LRB): LEFT TOTAL KNEE ARTHROPLASTY  (Left) Principal Problem:   OA (osteoarthritis) of knee Active Problems:   Bradycardia   Hypotension, unspecified   Postoperative anemia due to acute blood loss   Postop Transfusion  Estimated body mass index is 25.48 kg/(m^2) as calculated from the following:   Height as of this encounter: 5\' 9"  (1.753 m).   Weight as of this encounter: 78.291 kg (172 lb 9.6 oz). Up with therapy Blood again today Recheck labs in AM  DVT Prophylaxis - Aspirin and Plavix, Stopping the Xarelto. Weight-Bearing as tolerated to left leg Continue total knee protocol.  Peter Morgan 07/14/2013, 10:03 AM    .

## 2013-07-14 NOTE — Progress Notes (Signed)
Physical Therapy Treatment Patient Details Name: Peter Morgan MRN: [id number] DOB: 02-05-1946 Today's Date: 07/14/2013 Time: 1555-1610 PT Time Calculation (min): 15 min  PT Assessment / Plan / Recommendation  History of Present Illness     PT Comments   PM session amb in hallway then assisted back to bed for CPM.  Pt doing well and tolerated both sessions well.   Follow Up Recommendations  Home health PT     Does the patient have the potential to tolerate intense rehabilitation     Barriers to Discharge        Equipment Recommendations  Rolling walker with 5" wheels    Recommendations for Other Services    Frequency 7X/week   Progress towards PT Goals Progress towards PT goals: Progressing toward goals  Plan Current plan remains appropriate    Precautions / Restrictions Precautions Precautions: Knee Precaution Comments: instructed on KI use and when to D/C Required Braces or Orthoses: Knee Immobilizer - Left Restrictions Weight Bearing Restrictions: Yes LLE Weight Bearing: Weight bearing as tolerated    Pertinent Vitals/Pain C/o 3/10 pain "not bad"    Mobility  Bed Mobility Bed Mobility: Sit to Supine Supine to Sit: 4: Min assist Sit to Supine: 3: Mod assist Details for Bed Mobility Assistance: Min Assist for L LE and increased time Transfers Transfers: Sit to Stand;Stand to Sit Sit to Stand: 4: Min guard;4: Min assist;From chair/3-in-1 Stand to Sit: 4: Min guard;4: Min assist;To bed Details for Transfer Assistance: 25% VC's on proper hand placement and increased time Ambulation/Gait Ambulation/Gait Assistance: 4: Min guard;4: Min Environmental consultant (Feet): 70 Feet Assistive device: Rolling walker Ambulation/Gait Assistance Details: VC's for upright posture and safety with turns Gait Pattern: Step-to pattern;Step-through pattern;Decreased stance time - right;Decreased stance time - left     PT Goals (current goals can now be found in the care plan  section)    Visit Information  Last PT Received On: 07/14/13 Assistance Needed: +1    Subjective Data      Cognition       Balance     End of Session PT - End of Session Equipment Utilized During Treatment: Left knee immobilizer Activity Tolerance: Patient tolerated treatment well Patient left: in bed;with call bell/phone within reach;with family/visitor present   Felecia Shelling  PTA [facility]  Acute  Rehab Pager      [phone]

## 2013-07-14 NOTE — Progress Notes (Signed)
OT Cancellation Note  Patient Details Name: Peter Morgan MRN: [id number] DOB: Apr 28, 1946   Cancelled Treatment:    Reason Eval/Treat Not Completed: Medical issues which prohibited therapy  Pt getting blood. Will plan to see him tomorrow am.    [facility] 07/14/2013, 12:27 PM Marica Otter, OTR/L [phone] 07/14/2013

## 2013-07-14 NOTE — Progress Notes (Signed)
Physical Therapy Treatment Patient Details Name: Peter Morgan MRN: [id number] DOB: 1946/08/25 Today's Date: 07/14/2013 Time: 4098-1191 PT Time Calculation (min): 42 min  PT Assessment / Plan / Recommendation  History of Present Illness     PT Comments   POD # 3 L TKR with post op bradycardia and hypotension.  AM PT session deferred 2nd blood transfusion. Assisted OOB after 1st unit complete and BP's taken: supine 120/74, EOB 128/77 and standing 139/79. Amb pt in hallway then performed TE's while in recliner.  Pt tolerated session well.   Follow Up Recommendations  Home health PT     Does the patient have the potential to tolerate intense rehabilitation     Barriers to Discharge        Equipment Recommendations  Rolling walker with 5" wheels    Recommendations for Other Services    Frequency 7X/week   Progress towards PT Goals Progress towards PT goals: Progressing toward goals  Plan Current plan remains appropriate    Precautions / Restrictions Precautions Precautions: Knee Precaution Comments: instructed on KI use and when to D/C Required Braces or Orthoses: Knee Immobilizer - Left Restrictions Weight Bearing Restrictions: Yes LLE Weight Bearing: Weight bearing as tolerated    Pertinent Vitals/Pain C/o 3/10 during gait ICE applied Pre medicated    Mobility  Bed Mobility Bed Mobility: Supine to Sit Supine to Sit: 4: Min assist Details for Bed Mobility Assistance: Min Assist for L LE and increased time Transfers Transfers: Sit to Stand;Stand to Sit Sit to Stand: 4: Min guard;4: Min assist;From bed Stand to Sit: 4: Min guard;4: Min assist Details for Transfer Assistance: 25% VC's on proper hand placement and increased time Ambulation/Gait Ambulation/Gait Assistance: 4: Min guard;4: Min Environmental consultant (Feet): 55 Feet Assistive device: Rolling walker Ambulation/Gait Assistance Details: 25% VC's on proper sequencing and increased time Gait Pattern:  Step-to pattern;Step-through pattern;Decreased stance time - right;Decreased stance time - left   Total Knee Replacement TE's 10 reps B LE ankle pumps 10 reps knee presses 10 reps heel slides  10 reps SAQ's 10 reps SLR's 10 reps ABD Followed by ICE   PT Goals (current goals can now be found in the care plan section)    Visit Information  Last PT Received On: 07/14/13 Assistance Needed: +1    Subjective Data      Cognition       Balance     End of Session PT - End of Session Equipment Utilized During Treatment: Left knee immobilizer Activity Tolerance: Patient tolerated treatment well Patient left: in chair;with call bell/phone within reach;with family/visitor present   Felecia Shelling  PTA [facility]  Acute  Rehab Pager      [phone]

## 2013-07-15 LAB — BASIC METABOLIC PANEL
Chloride: 99 mEq/L (ref 96–112)
GFR calc Af Amer: >90 mL/min (ref >90)
GFR calc non Af Amer: 87 mL/min — ABNORMAL LOW (ref >90)
Potassium: 3.6 mEq/L (ref 3.5–5.1)
Sodium: 137 mEq/L (ref 135–145)

## 2013-07-15 LAB — TYPE AND SCREEN
ABO/RH(D): A POS
Antibody Screen: NEGATIVE
Unit division: 0
Unit division: 0

## 2013-07-15 LAB — CBC
HCT: 28.5 % — ABNORMAL LOW (ref 39.0–52.0)
Hemoglobin: 9.7 g/dL — ABNORMAL LOW (ref 13.0–17.0)
RBC: 3.12 MIL/uL — ABNORMAL LOW (ref 4.22–5.81)
WBC: 7.9 10*3/uL (ref 4.0–10.5)

## 2013-07-15 MED ORDER — METHOCARBAMOL 500 MG PO TABS: 500.0000 mg | ORAL_TABLET | Freq: Four times a day (QID) | ORAL | Status: DC | PRN

## 2013-07-15 MED ORDER — HYDROMORPHONE HCL 2 MG PO TABS: 2.0000 mg | ORAL_TABLET | ORAL | Status: DC | PRN

## 2013-07-15 MED ORDER — POLYSACCHARIDE IRON COMPLEX 150 MG PO CAPS: 150.0000 mg | ORAL_CAPSULE | Freq: Every day | ORAL | Status: DC

## 2013-07-15 NOTE — Progress Notes (Signed)
Occupational Therapy Discharge Patient Details Name: Peter Morgan MRN: [id number] DOB: 02/24/1946 Today's Date: 07/15/2013 Time:  -     Patient discharged from OT services secondary to Pt feels comfortable with shower transfer: verbally reviewed and handout given..  Please see latest therapy progress note for current level of functioning and progress toward goals.    Progress and discharge plan discussed with patient and/or caregiver: Patient/Caregiver agrees with plan  GO     Aleeya Veitch 07/15/2013, 10:28 AM

## 2013-07-15 NOTE — Progress Notes (Signed)
[facility]  [facility] is providing the following services: Rolling walker and commode  If patient discharges after hours, please call [phone].   Renard Hamper [phone] 07/15/2013, 11:43 AM

## 2013-07-15 NOTE — Care Management Note (Signed)
    Page 1 of 2   07/15/2013     2:35:47 PM   CARE MANAGEMENT NOTE 07/15/2013  Patient:  Peter Morgan, Peter Morgan   Account Number:  [id number]  Date Initiated:  07/13/2013  Documentation initiated by:  DAVIS,RHONDA  Subjective/Objective Assessment:   to sdu after surg due to hypotension and irreg heart beats.     Action/Plan:   home once stable   Anticipated DC Date:  07/14/2013   Anticipated DC Plan:  HOME/SELF CARE  In-house referral  NA      DC Planning Services  CM consult      [facility] Choice  HOME HEALTH  DURABLE MEDICAL EQUIPMENT   Choice offered to / List presented to:  C-3 Spouse   DME arranged  3-N-1  [facility] [place]      DME agency  [facility]     HH arranged  HH-2 PT      [facility] agency  [facility]   Status of service:  Completed, signed off Medicare Important Message given?  NA - LOS <3 / Initial given by admissions (If response is "NO", the following Medicare IM given date fields will be blank) Date Medicare IM given:   Date Additional Medicare IM given:    Discharge Disposition:  HOME W HOME HEALTH SERVICES  Per UR Regulation:  Reviewed for med. necessity/level of care/duration of stay  If discussed at Long Length of Stay Meetings, dates discussed:    Comments:  07/15/2013 Colleen Can BSN RN CCm [phone] CM spoke with patientand spouse. Current plans are for patient to return to his home in [place] where spouse will be caregiver. [facility] will provide HHpt services. Contact information for [facility] given to pt's spouse. She voices understanding.  [id number] Earlene Plater, RN,BSN,CCM: Case management [phone] Chart reviewed and updated.  Next chart review due on [id number]. Needs for discharge at time of review:  None

## 2013-07-15 NOTE — Progress Notes (Signed)
Physical Therapy Treatment Patient Details Name: Peter Morgan MRN: [id number] DOB: 10/24/1946 Today's Date: 07/15/2013 Time:10:39 - 11:08 PT Time Calculation (min): 29  PT Assessment / Plan / Recommendation  History of Present Illness     PT Comments   Assisted pt out of recliner to amb in hallway then practiced going up 4 steps backward with RW due to no rails. Spouse present and was educated and instructed on safe handling tech.    Follow Up Recommendations  Home health PT     Does the patient have the potential to tolerate intense rehabilitation     Barriers to Discharge        Equipment Recommendations       Recommendations for Other Services    Frequency 7X/week   Progress towards PT Goals Progress towards PT goals: Progressing toward goals  Plan      Precautions / Restrictions Precautions Precautions: Knee Precaution Comments: D/C KI and instructed pt Restrictions Weight Bearing Restrictions: No LLE Weight Bearing: Weight bearing as tolerated    Pertinent Vitals/Pain C/o 3/10 ICE applied    Mobility  Transfers Transfers: Sit to Stand;Stand to Sit Sit to Stand: 5: Supervision;4: Min guard;From toilet;From bed;From chair/3-in-1 Stand to Sit: 5: Supervision;4: Min guard;To bed;To chair/3-in-1;To toilet Details for Transfer Assistance: 25% VC's on proper hand placement and increased time plus safety with turns Ambulation/Gait Ambulation/Gait Assistance: 4: Min guard;5: Supervision Ambulation Distance (Feet): 103 Feet Assistive device: Rolling walker Ambulation/Gait Assistance Details: <25% VC's on proper safety with turns and backward gait sequencing Gait Pattern: Step-to pattern;Step-through pattern;Decreased stance time - right;Decreased stance time - left     PT Goals (current goals can now be found in the care plan section)    Visit Information  Last PT Received On: 07/15/13 Assistance Needed: +1    Subjective Data      Cognition        Balance     End of Session PT - End of Session Equipment Utilized During Treatment: Gait belt Activity Tolerance: Patient tolerated treatment well Patient left: in chair;with call bell/phone within reach   Felecia Shelling  PTA [facility]  Acute  Rehab Pager      [phone]

## 2013-07-15 NOTE — Progress Notes (Signed)
Physical Therapy Treatment Patient Details Name: Peter Morgan MRN: [id number] DOB: 15-Dec-1946 Today's Date: 07/15/2013 Time: 0912-0953 PT Time Calculation (min): 41 min  PT Assessment / Plan / Recommendation  History of Present Illness     PT Comments   POD # 4 L TKR am session.  Assisted pt OOB to amb to BR.  Amb out of BR to hallway.  Pt feeling better and Bp 139/83 with no c/o dizziness.   Follow Up Recommendations  Home health PT     Does the patient have the potential to tolerate intense rehabilitation     Barriers to Discharge        Equipment Recommendations       Recommendations for Other Services    Frequency 7X/week   Progress towards PT Goals Progress towards PT goals: Progressing toward goals  Plan      Precautions / Restrictions Precautions Precautions: Knee Precaution Comments: D/C KI and instructed pt Restrictions Weight Bearing Restrictions: No LLE Weight Bearing: Weight bearing as tolerated   Pertinent Vitals/Pain  C/o 4/10 knee pain ICE applied    Mobility  Bed Mobility Bed Mobility: Supine to Sit Supine to Sit: 4: Min guard Details for Bed Mobility Assistance: increased time Transfers Transfers: Sit to Stand;Stand to Sit Sit to Stand: 5: Supervision;4: Min guard;From toilet;From bed;From chair/3-in-1 Stand to Sit: 5: Supervision;4: Min guard;To bed;To chair/3-in-1;To toilet Details for Transfer Assistance: 25% VC's on proper hand placement and increased time plus safety with turns Ambulation/Gait Ambulation/Gait Assistance: 4: Min guard;5: Supervision Ambulation Distance (Feet): 95 Feet Assistive device: Rolling walker Ambulation/Gait Assistance Details: <25% VC's on proper safety with turns and backward gait sequencing Gait Pattern: Step-to pattern;Step-through pattern;Decreased stance time - right;Decreased stance time - left     PT Goals (current goals can now be found in the care plan section)    Visit Information  Last PT  Received On: 07/15/13 Assistance Needed: +1    Subjective Data      Cognition       Balance     End of Session PT - End of Session Equipment Utilized During Treatment: Gait belt Activity Tolerance: Patient tolerated treatment well Patient left: in chair;with call bell/phone within reach   Felecia Shelling  PTA [facility]  Acute  Rehab Pager      [phone]

## 2013-07-15 NOTE — Progress Notes (Signed)
   Subjective: 4 Days Post-Op Procedure(s) (LRB): LEFT TOTAL KNEE ARTHROPLASTY  (Left) Patient reports pain as mild.   Patient seen in rounds for Dr. Lequita Halt.  Feeling better. Patient is well, and has had no acute complaints or problems Patient is ready to go home today.  Objective: Vital signs in last 24 hours: Temp:  [97.7 F (36.5 C)-98.3 F (36.8 C)] 98.1 F (36.7 C) (08/01 0528) Pulse Rate:  [73-90] 84 (08/01 0528) Resp:  [15-16] 16 (08/01 0528) BP: (104-144)/(64-85) 144/85 mmHg (08/01 0528) SpO2:  [97 %-100 %] 98 % (08/01 0528)  Intake/Output from previous day:  Intake/Output Summary (Last 24 hours) at 07/15/13 0753 Last data filed at 07/15/13 0749  Gross per 24 hour  Intake   1695 ml  Output   1650 ml  Net     45 ml    Intake/Output this shift: Total I/O In: 240 [P.O.:240] Out: 300 [Urine:300]  Labs:  Recent Labs  07/13/13 0304 07/14/13 0420 07/15/13 0414  HGB 8.8* 7.4* 9.7*    Recent Labs  07/14/13 0420 07/15/13 0414  WBC 9.9 7.9  RBC 2.31* 3.12*  HCT 21.9* 28.5*  PLT 57* 123*    Recent Labs  07/14/13 0420 07/15/13 0414  NA 137 137  K 4.3 3.6  CL 103 99  CO2 31 32  BUN 12 12  CREATININE 0.92 0.87  GLUCOSE 99 96  CALCIUM 9.1 9.3   No results found for this basename: LABPT, INR,  in the last 72 hours  EXAM: General - Patient is Alert, Appropriate and Oriented Extremity - Neurovascular intact Sensation intact distally Dorsiflexion/Plantar flexion intact No cellulitis present Incision - clean, dry, no drainage, healing Motor Function - intact, moving foot and toes well on exam.   Assessment/Plan: 4 Days Post-Op Procedure(s) (LRB): LEFT TOTAL KNEE ARTHROPLASTY  (Left) Procedure(s) (LRB): LEFT TOTAL KNEE ARTHROPLASTY  (Left) Past Medical History  Diagnosis Date  . Hyperlipidemia   . BPH (benign prostatic hyperplasia)   . Arthritis   . CAD (coronary artery disease)     Diagnosed 12/2011 following abnormal treadmill test - high  grade OM stenosis s/p DES 01/05/12, mild residual non-obstructive LAD, Lcx, RCA disease.  Normal LV function with EF 55-65% by cath 01/05/12  . Hypertension   . GERD (gastroesophageal reflux disease)    Principal Problem:   OA (osteoarthritis) of knee Active Problems:   Bradycardia   Hypotension, unspecified   Postoperative anemia due to acute blood loss   Postop Transfusion  Estimated body mass index is 25.48 kg/(m^2) as calculated from the following:   Height as of this encounter: 5\' 9"  (1.753 m).   Weight as of this encounter: 78.291 kg (172 lb 9.6 oz). Up with therapy Discharge home with home health Diet - Cardiac diet Follow up - in 2 weeks Activity - WBAT Disposition - Home Condition Upon Discharge - Stable D/C Meds - See DC Summary DVT Prophylaxis - Aspirin and Plavix  Peter Morgan 07/15/2013, 7:53 AM

## 2013-07-15 NOTE — Progress Notes (Signed)
Pt to d/c home with [place] home health. AVS reviewed and "My Chart" discussed with pt. Pt capable of verbalizing medications and follow-up appointments. Remains hemodynamically stable. No signs and symptoms of distress. Educated pt to return to ER in the case of SOB, dizziness, or chest pain.

## 2013-07-15 NOTE — Discharge Summary (Signed)
Physician Discharge Summary   Patient ID: Peter Morgan MRN: [id number] DOB/AGE: 68-Oct-1947 69 y.o.  Admit date: 07/11/2013 Discharge date: 07/15/2013  Primary Diagnosis: Osteoarthritis Right and Left knee    Admission Diagnoses:  Past Medical History  Diagnosis Date  . Hyperlipidemia   . BPH (benign prostatic hyperplasia)   . Arthritis   . CAD (coronary artery disease)     Diagnosed 12/2011 following abnormal treadmill test - high grade OM stenosis s/p DES 01/05/12, mild residual non-obstructive LAD, Lcx, RCA disease.  Normal LV function with EF 55-65% by cath 01/05/12  . Hypertension   . GERD (gastroesophageal reflux disease)    Discharge Diagnoses:   Principal Problem:   OA (osteoarthritis) of knee Active Problems:   Bradycardia   Hypotension, unspecified   Postoperative anemia due to acute blood loss   Postop Transfusion  Estimated body mass index is 25.48 kg/(m^2) as calculated from the following:   Height as of this encounter: 5\' 9"  (1.753 m).   Weight as of this encounter: 78.291 kg (172 lb 9.6 oz).  Procedure(s) (LRB): LEFT TOTAL KNEE ARTHROPLASTY  (Left)   Consults: Cardiology  HPI: Peter Morgan is a 67 y.o. year old male with end stage OA of his right knee with progressively worsening pain and dysfunction. He has constant pain, with activity and at rest and significant functional deficits with difficulties even with ADLs. He has had extensive non-op management including analgesics, injections of cortisone and viscosupplements, and home exercise program, but remains in significant pain with significant dysfunction. Radiographs show bone on bone arthritis medial and patellofemoral. He presents now for right Total Knee Arthroplasty.   Laboratory Data: Admission on 07/11/2013, Discharged on 07/15/2013  Component Date Value Range Status  . Glucose-Capillary 07/11/2013 159* 70 - 99 mg/dL Final  . Glucose-Capillary 07/11/2013 234* 70 - 99 mg/dL Final  .  Glucose-Capillary 07/11/2013 123* 70 - 99 mg/dL Final  . WBC 78/29/5621 7.0  4.0 - 10.5 K/uL Final  . RBC 07/12/2013 2.48* 4.22 - 5.81 MIL/uL Final  . Hemoglobin 07/12/2013 8.2* 13.0 - 17.0 g/dL Final  . HCT 30/86/5784 24.1* 39.0 - 52.0 % Final  . MCV 07/12/2013 97.2  78.0 - 100.0 fL Final  . MCH 07/12/2013 33.1  26.0 - 34.0 pg Final  . MCHC 07/12/2013 34.0  30.0 - 36.0 g/dL Final  . RDW 69/62/9528 13.2  11.5 - 15.5 % Final  . Platelets 07/12/2013 PLATELET CLUMPS NOTED ON SMEAR, COUNT APPEARS DECREASED  150 - 400 K/uL Final  . Sodium 07/12/2013 136  135 - 145 mEq/L Final  . Potassium 07/12/2013 4.1  3.5 - 5.1 mEq/L Final  . Chloride 07/12/2013 103  96 - 112 mEq/L Final  . CO2 07/12/2013 30  19 - 32 mEq/L Final  . Glucose, Bld 07/12/2013 134* 70 - 99 mg/dL Final  . BUN 41/32/4401 10  6 - 23 mg/dL Final  . Creatinine, Ser 07/12/2013 0.85  0.50 - 1.35 mg/dL Final  . Calcium 02/72/5366 8.1* 8.4 - 10.5 mg/dL Final  . GFR calc non Af Amer 07/12/2013 88* >90 mL/min Final  . GFR calc Af Amer 07/12/2013 >90  >90 mL/min Final   Comment:                                 The eGFR has been calculated  using the CKD EPI equation.                          This calculation has not been                          validated in all clinical                          situations.                          eGFR's persistently                          <90 mL/min signify                          possible Chronic Kidney Disease.  . Troponin I 07/11/2013 <0.30  <0.30 ng/mL Final   Comment:                                 Due to the release kinetics of cTnI,                          a negative result within the first hours                          of the onset of symptoms does not rule out                          myocardial infarction with certainty.                          If myocardial infarction is still suspected,                          repeat the test at appropriate intervals.    . Troponin I 07/12/2013 <0.30  <0.30 ng/mL Final   Comment:                                 Due to the release kinetics of cTnI,                          a negative result within the first hours                          of the onset of symptoms does not rule out                          myocardial infarction with certainty.                          If myocardial infarction is still suspected,                          repeat the test at appropriate intervals.  . Troponin I 07/12/2013 <0.30  <  0.30 ng/mL Final   Comment:                                 Due to the release kinetics of cTnI,                          a negative result within the first hours                          of the onset of symptoms does not rule out                          myocardial infarction with certainty.                          If myocardial infarction is still suspected,                          repeat the test at appropriate intervals.  . Order Confirmation 07/12/2013 ORDER PROCESSED BY BLOOD BANK   Final  . WBC 07/13/2013 11.7* 4.0 - 10.5 K/uL Final  . RBC 07/13/2013 2.81* 4.22 - 5.81 MIL/uL Final  . Hemoglobin 07/13/2013 8.8* 13.0 - 17.0 g/dL Final  . HCT 09/81/1914 26.3* 39.0 - 52.0 % Final  . MCV 07/13/2013 93.6  78.0 - 100.0 fL Final  . MCH 07/13/2013 31.3  26.0 - 34.0 pg Final  . MCHC 07/13/2013 33.5  30.0 - 36.0 g/dL Final  . RDW 78/29/5621 15.2  11.5 - 15.5 % Final  . Platelets 07/13/2013 78* 150 - 400 K/uL Final   Comment: SPECIMEN CHECKED FOR CLOTS                          PLATELET COUNT CONFIRMED BY SMEAR  . Sodium 07/13/2013 136  135 - 145 mEq/L Final  . Potassium 07/13/2013 4.5  3.5 - 5.1 mEq/L Final  . Chloride 07/13/2013 104  96 - 112 mEq/L Final  . CO2 07/13/2013 29  19 - 32 mEq/L Final  . Glucose, Bld 07/13/2013 153* 70 - 99 mg/dL Final  . BUN 30/86/5784 9  6 - 23 mg/dL Final  . Creatinine, Ser 07/13/2013 0.82  0.50 - 1.35 mg/dL Final  . Calcium 69/62/9528 8.7  8.4 - 10.5 mg/dL Final   . GFR calc non Af Amer 07/13/2013 89* >90 mL/min Final  . GFR calc Af Amer 07/13/2013 >90  >90 mL/min Final   Comment:                                 The eGFR has been calculated                          using the CKD EPI equation.                          This calculation has not been                          validated in all clinical  situations.                          eGFR's persistently                          <90 mL/min signify                          possible Chronic Kidney Disease.  . WBC 07/14/2013 9.9  4.0 - 10.5 K/uL Final  . RBC 07/14/2013 2.31* 4.22 - 5.81 MIL/uL Final  . Hemoglobin 07/14/2013 7.4* 13.0 - 17.0 g/dL Final  . HCT 16/09/9603 21.9* 39.0 - 52.0 % Final  . MCV 07/14/2013 94.8  78.0 - 100.0 fL Final  . MCH 07/14/2013 32.0  26.0 - 34.0 pg Final  . MCHC 07/14/2013 33.8  30.0 - 36.0 g/dL Final  . RDW 54/08/8118 15.1  11.5 - 15.5 % Final  . Platelets 07/14/2013 57* 150 - 400 K/uL Final   Comment: SPECIMEN CHECKED FOR CLOTS                          REPEATED TO VERIFY                          DELTA CHECK NOTED  . Sodium 07/14/2013 137  135 - 145 mEq/L Final  . Potassium 07/14/2013 4.3  3.5 - 5.1 mEq/L Final  . Chloride 07/14/2013 103  96 - 112 mEq/L Final  . CO2 07/14/2013 31  19 - 32 mEq/L Final  . Glucose, Bld 07/14/2013 99  70 - 99 mg/dL Final  . BUN 14/78/2956 12  6 - 23 mg/dL Final  . Creatinine, Ser 07/14/2013 0.92  0.50 - 1.35 mg/dL Final  . Calcium 21/30/8657 9.1  8.4 - 10.5 mg/dL Final  . GFR calc non Af Amer 07/14/2013 85* >90 mL/min Final  . GFR calc Af Amer 07/14/2013 >90  >90 mL/min Final   Comment:                                 The eGFR has been calculated                          using the CKD EPI equation.                          This calculation has not been                          validated in all clinical                          situations.                          eGFR's persistently                           <90 mL/min signify                          possible Chronic Kidney Disease.  . Order  Confirmation 07/14/2013 ORDER PROCESSED BY BLOOD BANK   Final  . Sodium 07/15/2013 137  135 - 145 mEq/L Final  . Potassium 07/15/2013 3.6  3.5 - 5.1 mEq/L Final  . Chloride 07/15/2013 99  96 - 112 mEq/L Final  . CO2 07/15/2013 32  19 - 32 mEq/L Final  . Glucose, Bld 07/15/2013 96  70 - 99 mg/dL Final  . BUN 11/91/4782 12  6 - 23 mg/dL Final  . Creatinine, Ser 07/15/2013 0.87  0.50 - 1.35 mg/dL Final  . Calcium 95/62/1308 9.3  8.4 - 10.5 mg/dL Final  . GFR calc non Af Amer 07/15/2013 87* >90 mL/min Final  . GFR calc Af Amer 07/15/2013 >90  >90 mL/min Final   Comment:                                 The eGFR has been calculated                          using the CKD EPI equation.                          This calculation has not been                          validated in all clinical                          situations.                          eGFR's persistently                          <90 mL/min signify                          possible Chronic Kidney Disease.  . WBC 07/15/2013 7.9  4.0 - 10.5 K/uL Final  . RBC 07/15/2013 3.12* 4.22 - 5.81 MIL/uL Final  . Hemoglobin 07/15/2013 9.7* 13.0 - 17.0 g/dL Final   Comment: DELTA CHECK NOTED                          REPEATED TO VERIFY                          POST TRANSFUSION SPECIMEN  . HCT 07/15/2013 28.5* 39.0 - 52.0 % Final  . MCV 07/15/2013 91.3  78.0 - 100.0 fL Final  . MCH 07/15/2013 31.1  26.0 - 34.0 pg Final  . MCHC 07/15/2013 34.0  30.0 - 36.0 g/dL Final  . RDW 65/78/4696 16.0* 11.5 - 15.5 % Final  . Platelets 07/15/2013 123* 150 - 400 K/uL Final   Comment: DELTA CHECK NOTED                          REPEATED TO VERIFY                          POST TRANSFUSION [facility] Outpatient Visit on 07/06/2013  Component Date Value Range Status  . aPTT 07/06/2013 28  24 -  37 seconds Final  . WBC 07/06/2013 5.6  4.0 - 10.5 K/uL Final  .  RBC 07/06/2013 4.08* 4.22 - 5.81 MIL/uL Final  . Hemoglobin 07/06/2013 13.1  13.0 - 17.0 g/dL Final  . HCT 30/86/5784 39.7  39.0 - 52.0 % Final  . MCV 07/06/2013 97.3  78.0 - 100.0 fL Final  . MCH 07/06/2013 32.1  26.0 - 34.0 pg Final  . MCHC 07/06/2013 33.0  30.0 - 36.0 g/dL Final  . RDW 69/62/9528 13.0  11.5 - 15.5 % Final  . Platelets 07/06/2013 118* 150 - 400 K/uL Final   Comment: SPECIMEN CHECKED FOR CLOTS                          PLATELET COUNT CONFIRMED BY SMEAR  . Sodium 07/06/2013 134* 135 - 145 mEq/L Final  . Potassium 07/06/2013 4.7  3.5 - 5.1 mEq/L Final  . Chloride 07/06/2013 97  96 - 112 mEq/L Final  . CO2 07/06/2013 30  19 - 32 mEq/L Final  . Glucose, Bld 07/06/2013 96  70 - 99 mg/dL Final  . BUN 41/32/4401 15  6 - 23 mg/dL Final  . Creatinine, Ser 07/06/2013 0.86  0.50 - 1.35 mg/dL Final  . Calcium 02/72/5366 9.9  8.4 - 10.5 mg/dL Final  . Total Protein 07/06/2013 7.0  6.0 - 8.3 g/dL Final  . Albumin 44/02/4741 3.7  3.5 - 5.2 g/dL Final  . AST 59/56/3875 15  0 - 37 U/L Final  . ALT 07/06/2013 15  0 - 53 U/L Final  . Alkaline Phosphatase 07/06/2013 66  39 - 117 U/L Final  . Total Bilirubin 07/06/2013 0.4  0.3 - 1.2 mg/dL Final  . GFR calc non Af Amer 07/06/2013 88* >90 mL/min Final  . GFR calc Af Amer 07/06/2013 >90  >90 mL/min Final   Comment:                                 The eGFR has been calculated                          using the CKD EPI equation.                          This calculation has not been                          validated in all clinical                          situations.                          eGFR's persistently                          <90 mL/min signify                          possible Chronic Kidney Disease.  Marland Kitchen Prothrombin Time 07/06/2013 12.2  11.6 - 15.2 seconds Final  . INR 07/06/2013 0.92  0.00 - 1.49 Final  . ABO/RH(D) 07/06/2013 A POS   Final  . Antibody Screen 07/06/2013 NEG   Final  .  Sample Expiration 07/06/2013  07/14/2013   Final  . Unit Number 07/06/2013 [id number]   Final  . Blood Component Type 07/06/2013 RED CELLS,LR   Final  . Unit division 07/06/2013 00   Final  . Status of Unit 07/06/2013 ISSUED,FINAL   Final  . Transfusion Status 07/06/2013 OK TO TRANSFUSE   Final  . Crossmatch Result 07/06/2013 Compatible   Final  . Unit Number 07/06/2013 [id number]   Final  . Blood Component Type 07/06/2013 RED CELLS,LR   Final  . Unit division 07/06/2013 00   Final  . Status of Unit 07/06/2013 ISSUED,FINAL   Final  . Transfusion Status 07/06/2013 OK TO TRANSFUSE   Final  . Crossmatch Result 07/06/2013 Compatible   Final  . Unit Number 07/06/2013 [id number]   Final  . Blood Component Type 07/06/2013 RED CELLS,LR   Final  . Unit division 07/06/2013 00   Final  . Status of Unit 07/06/2013 ISSUED,FINAL   Final  . Transfusion Status 07/06/2013 OK TO TRANSFUSE   Final  . Crossmatch Result 07/06/2013 Compatible   Final  . Unit Number 07/06/2013 [id number]   Final  . Blood Component Type 07/06/2013 RED CELLS,LR   Final  . Unit division 07/06/2013 00   Final  . Status of Unit 07/06/2013 ISSUED,FINAL   Final  . Transfusion Status 07/06/2013 OK TO TRANSFUSE   Final  . Crossmatch Result 07/06/2013 Compatible   Final  . Color, Urine 07/06/2013 YELLOW  YELLOW Final  . APPearance 07/06/2013 CLEAR  CLEAR Final  . Specific Gravity, Urine 07/06/2013 1.024  1.005 - 1.030 Final  . pH 07/06/2013 6.0  5.0 - 8.0 Final  . Glucose, UA 07/06/2013 NEGATIVE  NEGATIVE mg/dL Final  . Hgb urine dipstick 07/06/2013 NEGATIVE  NEGATIVE Final  . Bilirubin Urine 07/06/2013 NEGATIVE  NEGATIVE Final  . Ketones, ur 07/06/2013 NEGATIVE  NEGATIVE mg/dL Final  . Protein, ur 72/53/6644 NEGATIVE  NEGATIVE mg/dL Final  . Urobilinogen, UA 07/06/2013 1.0  0.0 - 1.0 mg/dL Final  . Nitrite 03/47/4259 NEGATIVE  NEGATIVE Final  . Leukocytes, UA 07/06/2013 NEGATIVE  NEGATIVE Final   MICROSCOPIC NOT DONE ON URINES WITH NEGATIVE  PROTEIN, BLOOD, LEUKOCYTES, NITRITE, OR GLUCOSE <1000 mg/dL.  Marland Kitchen MRSA, PCR 07/06/2013 NEGATIVE  NEGATIVE Final  . Staphylococcus aureus 07/06/2013 NEGATIVE  NEGATIVE Final   Comment:                                 The Xpert SA Assay (FDA                          approved for NASAL specimens                          in patients over 44 years of age),                          is one component of                          a comprehensive surveillance                          program.  Test performance has  been validated by [facility] for patients greater                          than or equal to 36 year old.                          It is not intended                          to diagnose infection nor to                          guide or monitor treatment.  . ABO/RH(D) 07/06/2013 A POS   Final     X-Rays:Dg Chest 2 View  07/06/2013   *RADIOLOGY REPORT*  Clinical Data: Hypertension.  CHEST - 2 VIEW  Comparison: January 21, 2012.  Findings: Cardiomediastinal silhouette appears normal.  No acute pulmonary disease is noted.  Bony thorax is intact.  IMPRESSION: No acute cardiopulmonary abnormality seen.   Original Report Authenticated By: Lupita Raider.,  M.D.    EKG: Orders placed during the hospital encounter of 07/11/13  . EKG 12-LEAD  . EKG 12-LEAD  . EKG 12-LEAD  . EKG 12-LEAD  . EKG     Hospital Course: Patient was admitted to [facility] and taken to the OR and underwent the above state procedure without complications.  Patient tolerated the procedure well and was later transferred to the recovery room and then to the orthopaedic floor for postoperative care.  They were given PO and IV analgesics for pain control following their surgery.  They were given 24 hours of postoperative antibiotics of  Anti-infectives   Start     Dose/Rate Route Frequency Ordered Stop   07/11/13 1400  ceFAZolin (ANCEF) IVPB 1 g/50 mL premix     1  g 100 mL/hr over 30 Minutes Intravenous Every 6 hours 07/11/13 1107 07/11/13 2216   07/11/13 0700  ceFAZolin (ANCEF) IVPB 2 g/50 mL premix     2 g 100 mL/hr over 30 Minutes Intravenous On call to O.R. 07/11/13 9562 07/11/13 0820     and started on DVT prophylaxis in the form of Xarelto.   PT and OT were ordered for total hip protocol.  The patient was allowed to be WBAT with therapy. Discharge planning was consulted to help with postop disposition and equipment needs.  Patient had a very rough night on the evening of surgery. Rapid Response had to be called. Event Note  Overview: Called for altered LOC, inability to obtain BP, HR 33  Initial Focused Assessment: Pt verbalizing upon arrival, HR 40s, BP 81/49  Interventions: 1L NS bolus and 12 lead EkG obtained.  Event Summary: Dr Despina Hick notified of Pt status and updated after EKG. At 1pm BP BP 127/72, HR 72 . Wife at bedside. Cardiology was also notified of events and consulted for evaluation of the patient. ASSESSMENT AND PLAN:  1. Bradycardia and hypotension. I suspect these are related to postoperative pain with a vasovagal episode. He seems to have poor tolerance to postop analgesic medications. I do not appreciate any significant changes on his EKG he has no other concerning symptoms. He has required fluid boluses to maintain his  blood pressure and this will need to be carefully followed. Would check serial enzymes, repeat an EKG in the morning, and continue with supportive care to maintain an adequate blood pressure. As long as enzymes are negative and no changes on his EKG, I do not think he will require further cardiac evaluation.  2. Coronary arthrosclerosis, native vessel. Stable without anginal symptoms. As above, doubt acute coronary event. Should resume antiplatelet therapy when okay from a postoperative perspective.  Will order enzymes and EKG followup, Dr Elease Hashimoto will see in the morning. thx  Signed,  Tonny Bollman, MD   POD 1 -  Patient reported pain as mild. Patient was seen in rounds with Dr. Lequita Halt. Transferred to stepdown yesterday due to his bradycardia and hypotension. Blood pressures were still low today. HGB noted to have dropped to 8.2, down from 13.1 preop. Gave blood today. Held therapy until he gets at least one unit of blood. Greatly appreciate Cardiology consultation. Patient has a fair amount of oozing at surgery and expected to have postop being on Plavix preop and not being able to use TXA prior to surgery. Left the HV drain for one more day due to the postop bleeding. EKG: Normal sinus rhythm, within normal limits. Heart rate 66 beats per minute.  ASSESSMENT AND PLAN:  1. Bradycardia and hypotension. Hspective.  Alvia Grove., MD, FACCis bradycardia has resolved. His hypotension is most likely related to blood loss anemia due to surgery. His knee drain is bloody. Cardiac enzymes are negative. No evidence of ACS.  2. Coronary arthrosclerosis, native vessel. Stable without anginal symptoms. As above, doubt acute coronary event. Should resume antiplatelet therapy when okay from a postoperative per They started to get up OOB with therapy on day one after blood.  POD 2 - Patient reported pain as mild and moderate.  Patient was seen in rounds for Dr. Lequita Halt. He was still hurting today but better than yesterday. He felt better overall since the blood yesterday. HGB was back up to 8.8. Continued to monitor for symptoms. If still ha dany sings or symptoms, will give another unit, especially due to the known heart disease. Appreciate Cardiology assistance with this nice patient.  EKG: Normal sinus rhythm, within normal limits. Heart rate 66 beats per minute.  ASSESSMENT AND PLAN:  1. Bradycardia and hypotension. His bradycardia has resolved. His hypotension is most likely related to blood loss anemia due to surgery. His knee drain is bloody. Cardiac enzymes are negative. No evidence of ACS. His HR and BP are  now stable  2. Coronary arthrosclerosis, native vessel. Stable without anginal symptoms. As above, doubt acute coronary event. Should resume antiplatelet therapy when okay from a postoperative perspective.  Will sign off. Call for questions.  DC'd telemetry and transferred back up to the 6th floor.  Continued to work with therapy into day two.  Dressing was changed on day two and the incision was healing well.    POD 3 - Patient reported pain as mild.  Patient was seen in rounds by Dr. Lequita Halt.  Patient was well, but has had some minor complaints of pain in the knee, requiring pain medications.  HGB has drifted back down again to 7.4. Gave two more units. DVT Prophylaxis - Aspirin and Plavix, Stopping the Xarelto.  POD 4 - By day four, the patient better and was feeling bhad progressed with therapy and meeting their goals. Incision was healing well.  Patient was seen in rounds and was ready to go home.  Discharge Medications: Prior to Admission medications   Medication Sig Start Date End Date Taking? Authorizing Provider  acetaminophen (TYLENOL) 500 MG tablet Take 1,000 mg by mouth every 4 (four) hours as needed for pain. Pain   Yes Historical Provider, MD  aspirin 81 MG tablet Take 81 mg by mouth daily.    Yes Historical Provider, MD  atorvastatin (LIPITOR) 20 MG tablet Take 20 mg by mouth every morning.   Yes Historical Provider, MD  clopidogrel (PLAVIX) 75 MG tablet Take 75 mg by mouth every morning.   Yes Historical Provider, MD  metoprolol succinate (TOPROL-XL) 50 MG 24 hr tablet Take 25 mg by mouth every morning. Take with or immediately following a meal.   Yes Historical Provider, MD  pantoprazole (PROTONIX) 40 MG tablet Take 40 mg by mouth daily.   Yes Historical Provider, MD  tamsulosin (FLOMAX) 0.4 MG CAPS Take 0.4 mg by mouth daily after breakfast.   Yes Historical Provider, MD  HYDROmorphone (DILAUDID) 2 MG tablet Take 1-2 tablets (2-4 mg total) by mouth every 4 (four) hours as  needed. 07/15/13   Brandell Maready Julien Girt, PA-C  iron polysaccharides (NIFEREX) 150 MG capsule Take 1 capsule (150 mg total) by mouth daily. 07/15/13   Aleesha Ringstad, PA-C  methocarbamol (ROBAXIN) 500 MG tablet Take 1 tablet (500 mg total) by mouth every 6 (six) hours as needed. 07/15/13   Jacelyn Cuen, PA-C  nitroGLYCERIN (NITROSTAT) 0.4 MG SL tablet Place 0.4 mg under the tongue every 5 (five) minutes as needed for chest pain.    Historical Provider, MD    Discharge home with home health  Diet - Cardiac diet  Follow up - in 2 weeks  Activity - WBAT  Disposition - Home  Condition Upon Discharge - Stable  D/C Meds - See DC Summary  DVT Prophylaxis - Aspirin and Plavix        Discharge Orders   Future Orders Complete By Expires     Call MD / Call 911  As directed     Comments:      If you experience chest pain or shortness of breath, CALL 911 and be transported to the hospital emergency room.  If you develope a fever above 101 F, pus (white drainage) or increased drainage or redness at the wound, or calf pain, call your surgeon's office.    Change dressing  As directed     Comments:      Change dressing daily with sterile 4 x 4 inch gauze dressing and apply TED hose. Do not submerge the incision under water.    Constipation Prevention  As directed     Comments:      Drink plenty of fluids.  Prune juice may be helpful.  You may use a stool softener, such as Colace (over the counter) 100 mg twice a day.  Use MiraLax (over the counter) for constipation as needed.    Diet - low sodium heart healthy  As directed     Discharge instructions  As directed     Comments:      Pick up stool softner and laxative for home. Do not submerge incision under water. May shower. Continue to use ice for pain and swelling from surgery.  Resume Plavix and Aspirin at home.    Do not put a pillow under the knee. Place it under the heel.  As directed     Do not sit on low chairs, stoools or toilet  seats, as it may be difficult  to get up from low surfaces  As directed     Driving restrictions  As directed     Comments:      No driving until released by the physician.    Increase activity slowly as tolerated  As directed     Lifting restrictions  As directed     Comments:      No lifting until released by the physician.    Patient may shower  As directed     Comments:      You may shower without a dressing once there is no drainage.  Do not wash over the wound.  If drainage remains, do not shower until drainage stops.    TED hose  As directed     Comments:      Use stockings (TED hose) for 3 weeks on both leg(s).  You may remove them at night for sleeping.    Weight bearing as tolerated  As directed         Medication List         acetaminophen 500 MG tablet  Commonly known as:  TYLENOL  Take 1,000 mg by mouth every 4 (four) hours as needed for pain. Pain     aspirin 81 MG tablet  Take 81 mg by mouth daily.     atorvastatin 20 MG tablet  Commonly known as:  LIPITOR  Take 20 mg by mouth every morning.     clopidogrel 75 MG tablet  Commonly known as:  PLAVIX  Take 75 mg by mouth every morning.     HYDROmorphone 2 MG tablet  Commonly known as:  DILAUDID  Take 1-2 tablets (2-4 mg total) by mouth every 4 (four) hours as needed.     iron polysaccharides 150 MG capsule  Commonly known as:  NIFEREX  Take 1 capsule (150 mg total) by mouth daily.     methocarbamol 500 MG tablet  Commonly known as:  ROBAXIN  Take 1 tablet (500 mg total) by mouth every 6 (six) hours as needed.     metoprolol succinate 50 MG 24 hr tablet  Commonly known as:  TOPROL-XL  Take 25 mg by mouth every morning. Take with or immediately following a meal.     nitroGLYCERIN 0.4 MG SL tablet  Commonly known as:  NITROSTAT  Place 0.4 mg under the tongue every 5 (five) minutes as needed for chest pain.     pantoprazole 40 MG tablet  Commonly known as:  PROTONIX  Take 40 mg by mouth daily.      tamsulosin 0.4 MG Caps capsule  Commonly known as:  FLOMAX  Take 0.4 mg by mouth daily after breakfast.       Follow-up Information   Follow up with Loanne Drilling, MD. Schedule an appointment as soon as possible for a visit in 2 weeks.   Contact information:   [address] [place] [address] [phone]       Signed: Patrica Duel 07/20/2013, 6:49 PM

## 2014-02-27 ENCOUNTER — Encounter: Payer: Self-pay | Admitting: Cardiology

## 2014-02-27 ENCOUNTER — Ambulatory Visit (INDEPENDENT_AMBULATORY_CARE_PROVIDER_SITE_OTHER): Payer: Medicare Other | Admitting: Cardiology

## 2014-02-27 VITALS — BP 135/82 | HR 74 | Ht 69.0 in | Wt 180.0 lb

## 2014-02-27 DIAGNOSIS — I498 Other specified cardiac arrhythmias: Secondary | ICD-10-CM

## 2014-02-27 DIAGNOSIS — I2510 Atherosclerotic heart disease of native coronary artery without angina pectoris: Secondary | ICD-10-CM

## 2014-02-27 DIAGNOSIS — R001 Bradycardia, unspecified: Secondary | ICD-10-CM

## 2014-02-27 NOTE — Progress Notes (Signed)
HPI The patient presents for follow up after a DES to his OM. He had nonobstructive plaque elsewhere..  Since I last saw him he has done well.  He did have knee surgery and had some problems with bleeding even though he had held his Plavix.  Marland Kitchen He is able to walk a up hills.  Hepatient denies any new symptoms such as chest discomfort, neck or arm discomfort. There has been no new shortness of breath, PND or orthopnea. There have been no reported palpitations, presyncope or syncope.   However, he is going to need to have his other knee replaced.   Allergies  Allergen Reactions  . Codeine Nausea And Vomiting    Current Outpatient Prescriptions  Medication Sig Dispense Refill  . acetaminophen (TYLENOL) 500 MG tablet Take 1,000 mg by mouth every 4 (four) hours as needed for pain. Pain      . aspirin 81 MG tablet Take 81 mg by mouth daily.       Marland Kitchen atorvastatin (LIPITOR) 20 MG tablet Take 20 mg by mouth every morning.      . clopidogrel (PLAVIX) 75 MG tablet Take 75 mg by mouth every morning.      Marland Kitchen HYDROmorphone (DILAUDID) 2 MG tablet Take 1-2 tablets (2-4 mg total) by mouth every 4 (four) hours as needed.  90 tablet  0  . iron polysaccharides (NIFEREX) 150 MG capsule Take 1 capsule (150 mg total) by mouth daily.  21 capsule  0  . methocarbamol (ROBAXIN) 500 MG tablet Take 1 tablet (500 mg total) by mouth every 6 (six) hours as needed.  80 tablet  0  . metoprolol succinate (TOPROL-XL) 50 MG 24 hr tablet Take 25 mg by mouth every morning. Take with or immediately following a meal.      . nitroGLYCERIN (NITROSTAT) 0.4 MG SL tablet Place 0.4 mg under the tongue every 5 (five) minutes as needed for chest pain.      . pantoprazole (PROTONIX) 40 MG tablet Take 40 mg by mouth daily.      . tamsulosin (FLOMAX) 0.4 MG CAPS Take 0.4 mg by mouth daily after breakfast.       No current facility-administered medications for this visit.    Past Medical History  Diagnosis Date  . Hyperlipidemia   . BPH  (benign prostatic hyperplasia)   . Arthritis   . CAD (coronary artery disease)     Diagnosed 12/2011 following abnormal treadmill test - high grade OM stenosis s/p DES 01/05/12, mild residual non-obstructive LAD, Lcx, RCA disease.  Normal LV function with EF 55-65% by cath 01/05/12  . Hypertension   . GERD (gastroesophageal reflux disease)     Past Surgical History  Procedure Laterality Date  . Knee arthroscopy      bilateral  . Appendectomy    . Tonsillectomy and adenoidectomy    . Nasal septum surgery    . Femur surgery    . Coronary stents     . Right middle finger amputation     . Total knee arthroplasty Left 07/11/2013    Procedure: LEFT TOTAL KNEE ARTHROPLASTY ;  Surgeon: Gearlean Alf, MD;  Location: [facility];  Service: Orthopedics;  Laterality: Left;    ROS:  As stated in the HPI and negative for all other systems.  PHYSICAL EXAM BP 135/82  Pulse 74  Ht 5\' 9"  (1.753 m)  Wt 180 lb (81.647 kg)  BMI 26.57 kg/m2 GENERAL:  Well appearing NECK:  No jugular venous  distention, waveform within normal limits, carotid upstroke brisk and symmetric, no bruits, no thyromegaly LUNGS:  Clear to auscultation bilaterally BACK:  No CVA tenderness CHEST:  Unremarkable HEART:  PMI not displaced or sustained,S1 and S2 within normal limits, no S3, no S4, no clicks, no rubs, no murmurs ABD:  Flat, positive bowel sounds normal in frequency in pitch, no bruits, no rebound, no guarding, no midline pulsatile mass, no hepatomegaly, no splenomegaly EXT:  2 plus pulses throughout, no edema, no cyanosis no clubbing   EKG:  Sinus rhythm, rate 74, nonspecific inferior T wave inversions, rightward axis, early transition in lead V2. 02/27/2014   ASSESSMENT AND PLAN  CAD:  The patient has no new sypmtoms.  No further cardiovascular testing is indicated.  We will continue with aggressive risk reduction and meds as listed.  He will continue ASA and Plavix.  However, given the bleeding he had with his last  knee surgery I would hold the Plavis 7 to 10 days before this surgery.    HYPERLIPIDEMIA:  Follow up with GREEN, Keenan Bachelor, MD.

## 2014-02-27 NOTE — Patient Instructions (Signed)
The current medical regimen is effective;  continue present plan and medications.  Follow up in 1 year with Dr Hochrein.  You will receive a letter in the mail 2 months before you are due.  Please call us when you receive this letter to schedule your follow up appointment.  

## 2014-03-27 ENCOUNTER — Telehealth: Payer: Self-pay | Admitting: Cardiology

## 2014-03-27 NOTE — Telephone Encounter (Signed)
Received request from Nurse fax box, documents faxed for surgical clearance. To: [employer] Fax number: [phone] Attention: [phone]/[phone]

## 2014-06-27 ENCOUNTER — Encounter (HOSPITAL_COMMUNITY): Payer: Self-pay | Admitting: Pharmacy Technician

## 2014-06-29 ENCOUNTER — Other Ambulatory Visit: Payer: Self-pay | Admitting: Orthopedic Surgery

## 2014-06-29 NOTE — H&P (Signed)
Peter Morgan. Ragan DOB: May 15, 1946 Married / Language: English / Race: White Male Date of Admission:  07-10-2014 Chief Complaint;  Right Knee Pain History of Present Illness  The patient is a 68 year old male who comes in for a preoperative history and physical. The patient is scheduled for a right total knee arthroplasty to be performed by Dr. Dione Plover. Aluisio, MD at [facility] on Monday, July 10, 2014. The patient is a 68 year old male presenting several months out from left total knee arthroplasty. The patient states that he is doing well at this time. The pain is under excellent control at this time and describe their pain as mild (soreness and stiffness). They are currently on Tylenol for their pain. The patient is currently doing home exercise program. The patient feels that they are progressing well at this time. Note for "Post TKA": Still difficulty w/ stairs. He has undergone a recent cortisone injection in right knee. Despite having the cortisone, the right knee continues to be a problem. He feels the left knee is doing very well at this time. Still has some occasional soreness, but nothing that is holding him down. He is pleased with how things are going. He states that the right knee does give him trouble and now reached a point where he would like to proceed with the knee replacement at this time. They have been treated conservatively in the past for the above stated problem and despite conservative measures, they continue to have progressive pain and severe functional limitations and dysfunction. They have failed non-operative management including home exercise, medications, and injections. It is felt that they would benefit from undergoing total joint replacement. Risks and benefits of the procedure have been discussed with the patient and they elect to proceed with surgery. There are no active contraindications to surgery such as ongoing infection or rapidly progressive neurological  disease.  Allergies:  No Know Drug Allergies  Intolerances: TraMADol HCl *ANALGESICS - OPIOID* Nausea. Codeine Derivatives Nausea. OxyCODONE HCl *ANALGESICS - OPIOID* Nausea.  Problem List/Past Medical  Status post total knee replacement, left Aftercare following joint replacement surgery (V54.81  Z47.1) Primary osteoarthritis of both knees (715.16  M17.0) Osteoarthritis of right knee (715.96  M17.9) Acute low back pain (724.2  M54.5) Coronary artery disease Urinary Frequency  Family History  Heart Disease father Congestive Heart Failure Father. father  Social History Living situation live with spouse Drug/Alcohol Rehab (Previously) no Number of flights of stairs before winded greater than 5 Marital status married Post-Surgical Plans Home Alcohol use current drinker; drinks wine; 5-7 per week Illicit drug use no Tobacco use never smoker Exercise Exercises daily; does running / walking and gym / weights Pain Contract no Current work status working part time Clinical research associate [employer] Drug/Alcohol Rehab (Currently) no Children 1  Medication History  Tylenol Extra Strength (500MG  Tablet, Oral) Active. Flomax (0.4MG  Capsule, Oral every morning) Active. Atorvastatin Calcium (20MG  Tablet, Oral) Active. Clopidogrel Bisulfate (75MG  Tablet, Oral) Active. Metoprolol Succinate ER (50MG  Tablet ER 24HR, Oral) Active. (25mg ) Pantoprazole Sodium (40MG  Tablet DR, Oral) Active. Aspirin Low Strength (81MG  Tablet Chewable, Oral) Active. ALPRAZolam ER (0.5MG  Tablet ER 24HR, Oral) Active. Colace (100MG  Capsule, Oral) Active.  Past Surgical History Heart Stents ONE (99% Blockage) Straighten Nasal Septum Sinus Surgery Arthroscopy of Knee bilateral  Review of Systems General Not Present- Chills, Fatigue, Fever, Memory Loss, Night Sweats, Weight Gain and Weight Loss. Skin Not Present- Eczema, Hives, Itching, Lesions and  Rash. HEENT  Not Present- Dentures, Double Vision, Headache, Hearing Loss, Tinnitus and Visual Loss. Respiratory Not Present- Allergies, Chronic Cough, Coughing up blood, Shortness of breath at rest and Shortness of breath with exertion. Cardiovascular Not Present- Chest Pain, Difficulty Breathing Lying Down, Murmur, Palpitations, Racing/skipping heartbeats and Swelling. Gastrointestinal Not Present- Abdominal Pain, Bloody Stool, Constipation, Diarrhea, Difficulty Swallowing, Heartburn, Jaundice, Loss of appetitie, Nausea and Vomiting. Male Genitourinary Present- Urinary frequency and Urinating at Night. Not Present- Blood in Urine, Discharge, Flank Pain, Incontinence, Painful Urination, Urgency, Urinary Retention and Weak urinary stream. Musculoskeletal Present- Joint Pain and Morning Stiffness. Not Present- Back Pain, Joint Swelling, Muscle Pain and Spasms. Neurological Not Present- Blackout spells, Difficulty with balance, Dizziness, Paralysis, Tremor and Weakness. Psychiatric Not Present- Insomnia.   Vitals Weight: 178 lb Height: 69in Weight was reported by patient. Height was reported by patient. Body Surface Area: 1.98 m Body Mass Index: 26.29 kg/m BP: 122/68 (Sitting, Right Arm, Standard)    Physical Exam  The physical exam findings are as follows: Note:Patient is a 68 year old male with continued pain. Patient is accompanied today by his wife Peter Morgan.  General Mental Status -Alert, cooperative and good historian. General Appearance-pleasant, Not in acute distress. Orientation-Oriented X3. Build & Nutrition-Well nourished and Well developed.  Head and Neck Head-normocephalic, atraumatic . Neck Global Assessment - supple, no bruit auscultated on the right, no bruit auscultated on the left.  Eye Pupil - Bilateral-Regular and Round. Motion - Bilateral-EOMI.  Chest and Lung Exam Auscultation Breath sounds - clear at anterior chest wall and clear at  posterior chest wall. Adventitious sounds - No Adventitious sounds.  Cardiovascular Auscultation Rhythm - Regular rate and rhythm(occasional skipped or ectopic beat). Heart Sounds - S1 WNL and S2 WNL. Murmurs & Other Heart Sounds - Auscultation of the heart reveals - No Murmurs.  Abdomen Palpation/Percussion Tenderness - Abdomen is non-tender to palpation. Rigidity (guarding) - Abdomen is soft. Auscultation Auscultation of the abdomen reveals - Bowel sounds normal.  Male Genitourinary Note: Not done, not pertinent to present illness   Musculoskeletal Note: On exam he is a well developed male, alert and oriented in no apparent distress. Well developed male in no distress. The left knee looks great. Range of motion is 0-132 degrees with no swelling or instability. Right knee with varus deformity. Range 5-130 degrees. Moderate crepitus on range of motion. Tender medial greater than lateral with no instability. Preexisting right foot drop.  RADIOGRAPHS: AP and lateral right knee shows tricompartmental bone on bone arthritis in the right knee with no deformity. He also has a previous fracture of the right distal fibula with a lot of callus formation.   Assessment & Plan Osteoarthritis of right knee (715.96  M17.9) Impression: Right Knee Note:Plan is for a Right Total Knee Replacement by Dr. Wynelle Link.  Plan is to go home.  PCP - Dr. Nyoka Cowden  The patient will receive topical TXA (tranexamic acid) due to: CAD  Signed electronically by Ok Edwards, III PA-C

## 2014-07-03 ENCOUNTER — Encounter (HOSPITAL_COMMUNITY)
Admission: RE | Admit: 2014-07-03 | Discharge: 2014-07-03 | Disposition: A | Discharge status: Home / Self Care | Payer: Medicare Other | Source: Ambulatory Visit | Attending: Orthopedic Surgery | Admitting: Orthopedic Surgery

## 2014-07-03 ENCOUNTER — Encounter (INDEPENDENT_AMBULATORY_CARE_PROVIDER_SITE_OTHER): Payer: Self-pay

## 2014-07-03 ENCOUNTER — Ambulatory Visit (HOSPITAL_COMMUNITY)
Admission: RE | Admit: 2014-07-03 | Discharge: 2014-07-03 | Disposition: A | Discharge status: Home / Self Care | Payer: Medicare Other | Source: Ambulatory Visit | Attending: Orthopedic Surgery | Admitting: Orthopedic Surgery

## 2014-07-03 ENCOUNTER — Encounter (HOSPITAL_COMMUNITY): Payer: Self-pay

## 2014-07-03 DIAGNOSIS — Z01812 Encounter for preprocedural laboratory examination: Secondary | ICD-10-CM | POA: Insufficient documentation

## 2014-07-03 DIAGNOSIS — M47814 Spondylosis without myelopathy or radiculopathy, thoracic region: Secondary | ICD-10-CM | POA: Insufficient documentation

## 2014-07-03 DIAGNOSIS — I10 Essential (primary) hypertension: Secondary | ICD-10-CM | POA: Insufficient documentation

## 2014-07-03 DIAGNOSIS — Z01818 Encounter for other preprocedural examination: Secondary | ICD-10-CM | POA: Insufficient documentation

## 2014-07-03 HISTORY — DX: Nausea with vomiting, unspecified: R11.2

## 2014-07-03 HISTORY — DX: Other specified postprocedural states: Z98.890

## 2014-07-03 LAB — URINALYSIS, ROUTINE W REFLEX MICROSCOPIC
Bilirubin Urine: NEGATIVE
Glucose, UA: NEGATIVE mg/dL
Hgb urine dipstick: NEGATIVE
KETONES UR: NEGATIVE mg/dL
Leukocytes, UA: NEGATIVE
Nitrite: NEGATIVE
PH: 7.5 (ref 5.0–8.0)
Protein, ur: NEGATIVE mg/dL
Specific Gravity, Urine: 1.019 (ref 1.005–1.030)
Urobilinogen, UA: 0.2 mg/dL (ref 0.0–1.0)

## 2014-07-03 LAB — COMPREHENSIVE METABOLIC PANEL
ALK PHOS: 60 U/L (ref 39–117)
ALT: 17 U/L (ref 0–53)
AST: 19 U/L (ref 0–37)
Albumin: 3.8 g/dL (ref 3.5–5.2)
Anion gap: 9 (ref 5–15)
BUN: 16 mg/dL (ref 6–23)
CO2: 27 meq/L (ref 19–32)
Calcium: 9.9 mg/dL (ref 8.4–10.5)
Chloride: 104 mEq/L (ref 96–112)
Creatinine, Ser: 0.87 mg/dL (ref 0.50–1.35)
GFR, EST NON AFRICAN AMERICAN: 87 mL/min — AB (ref >90)
Glucose, Bld: 102 mg/dL — ABNORMAL HIGH (ref 70–99)
POTASSIUM: 4.4 meq/L (ref 3.7–5.3)
SODIUM: 140 meq/L (ref 137–147)
Total Bilirubin: 0.4 mg/dL (ref 0.3–1.2)
Total Protein: 7 g/dL (ref 6.0–8.3)

## 2014-07-03 LAB — CBC
HCT: 38 % — ABNORMAL LOW (ref 39.0–52.0)
Hemoglobin: 12.6 g/dL — ABNORMAL LOW (ref 13.0–17.0)
MCH: 31.5 pg (ref 26.0–34.0)
MCHC: 33.2 g/dL (ref 30.0–36.0)
MCV: 95 fL (ref 78.0–100.0)
Platelets: 145 10*3/uL — ABNORMAL LOW (ref 150–400)
RBC: 4 MIL/uL — ABNORMAL LOW (ref 4.22–5.81)
RDW: 13.1 % (ref 11.5–15.5)
WBC: 5.9 10*3/uL (ref 4.0–10.5)

## 2014-07-03 LAB — APTT: aPTT: 30 seconds (ref 24–37)

## 2014-07-03 LAB — PROTIME-INR
INR: 0.99 (ref 0.00–1.49)
PROTHROMBIN TIME: 13.1 s (ref 11.6–15.2)

## 2014-07-03 LAB — SURGICAL PCR SCREEN
MRSA, PCR: NEGATIVE
Staphylococcus aureus: NEGATIVE

## 2014-07-03 NOTE — Progress Notes (Signed)
Need ordersin EPIC.  Surgery on 07/10/14.  Preop on 07/03/14 and no orders in EPIC at time of preop appt.  Thank You.

## 2014-07-03 NOTE — Progress Notes (Signed)
Arlee Muslim, PA  -  Please enter preop orders in Epic for [employer].   You have entered order for Tranexamic Acid - but no other preop orders.  Thanks

## 2014-07-03 NOTE — Patient Instructions (Signed)
Peter Morgan  07/03/2014   Your procedure is scheduled on: 07/10/2014     Report to [facility].  Follow the Signs to [place] at        am  Call this number if you have problems the morning of surgery: [phone]   Remember:   Do not eat food or drink liquids after midnight.   Take these medicines the morning of surgery with A SIP OF WATER:    Do not wear jewelry,   Do not wear lotions, powders, or perfumes. .  . Men may shave face and neck.  Do not bring valuables to the hospital.  Contacts, dentures or bridgework may not be worn into surgery.  Leave suitcase in the car. After surgery it may be brought to your room.  For patients admitted to the hospital, checkout time is 11:00 AM the day of  discharge.   [facility] - Preparing for Surgery Before surgery, you can play an important role.  Because skin is not sterile, your skin needs to be as free of germs as possible.  You can reduce the number of germs on your skin by washing with CHG (chlorahexidine gluconate) soap before surgery.  CHG is an antiseptic cleaner which kills germs and bonds with the skin to continue killing germs even after washing. Please DO NOT use if you have an allergy to CHG or antibacterial soaps.  If your skin becomes reddened/irritated stop using the CHG and inform your nurse when you arrive at Short Stay. Do not shave (including legs and underarms) for at least 48 hours prior to the first CHG shower.  You may shave your face/neck. Please follow these instructions carefully:  1.  Shower with CHG Soap the night before surgery and the  morning of Surgery.  2.  If you choose to wash your hair, wash your hair first as usual with your  normal  shampoo.  3.  After you shampoo, rinse your hair and body thoroughly to remove the  shampoo.                           4.  Use CHG as you would any other liquid soap.  You can apply chg directly  to the skin and wash                       Gently with a  scrungie or clean washcloth.  5.  Apply the CHG Soap to your body ONLY FROM THE NECK DOWN.   Do not use on face/ open                           Wound or open sores. Avoid contact with eyes, ears mouth and genitals (private parts).                       Wash face,  Genitals (private parts) with your normal soap.             6.  Wash thoroughly, paying special attention to the area where your surgery  will be performed.  7.  Thoroughly rinse your body with warm water from the neck down.  8.  DO NOT shower/wash with your normal soap after using and rinsing off  the CHG Soap.  9.  Pat yourself dry with a clean towel.            10.  Wear clean pajamas.            11.  Place clean sheets on your bed the night of your first shower and do not  sleep with pets. Day of Surgery : Do not apply any lotions/deodorants the morning of surgery.  Please wear clean clothes to the hospital/surgery center.  FAILURE TO FOLLOW THESE INSTRUCTIONS MAY RESULT IN THE CANCELLATION OF YOUR SURGERY PATIENT SIGNATURE_________________________________  NURSE SIGNATURE__________________________________  ________________________________________________________________________   Peter Morgan  An incentive spirometer is a tool that can help keep your lungs clear and active. This tool measures how well you are filling your lungs with each breath. Taking long deep breaths may help reverse or decrease the chance of developing breathing (pulmonary) problems (especially infection) following:  A long period of time when you are unable to move or be active. BEFORE THE PROCEDURE   If the spirometer includes an indicator to show your best effort, your nurse or respiratory therapist will set it to a desired goal.  If possible, sit up straight or lean slightly forward. Try not to slouch.  Hold the incentive spirometer in an upright position. INSTRUCTIONS FOR USE  1. Sit on the edge of your bed if possible,  or sit up as far as you can in bed or on a chair. 2. Hold the incentive spirometer in an upright position. 3. Breathe out normally. 4. Place the mouthpiece in your mouth and seal your lips tightly around it. 5. Breathe in slowly and as deeply as possible, raising the piston or the ball toward the top of the column. 6. Hold your breath for 3-5 seconds or for as long as possible. Allow the piston or ball to fall to the bottom of the column. 7. Remove the mouthpiece from your mouth and breathe out normally. 8. Rest for a few seconds and repeat Steps 1 through 7 at least 10 times every 1-2 hours when you are awake. Take your time and take a few normal breaths between deep breaths. 9. The spirometer may include an indicator to show your best effort. Use the indicator as a goal to work toward during each repetition. 10. After each set of 10 deep breaths, practice coughing to be sure your lungs are clear. If you have an incision (the cut made at the time of surgery), support your incision when coughing by placing a pillow or rolled up towels firmly against it. Once you are able to get out of bed, walk around indoors and cough well. You may stop using the incentive spirometer when instructed by your caregiver.  RISKS AND COMPLICATIONS  Take your time so you do not get dizzy or light-headed.  If you are in pain, you may need to take or ask for pain medication before doing incentive spirometry. It is harder to take a deep breath if you are having pain. AFTER USE  Rest and breathe slowly and easily.  It can be helpful to keep track of a log of your progress. Your caregiver can provide you with a simple table to help with this. If you are using the spirometer at home, follow these instructions: [employer] IF:   You are having difficultly using the spirometer.  You have trouble using the spirometer as often as instructed.  Your pain medication is not giving enough relief while using the  spirometer.  You  develop fever of 100.5 F (38.1 C) or higher. SEEK IMMEDIATE MEDICAL CARE IF:   You cough up bloody sputum that had not been present before.  You develop fever of 102 F (38.9 C) or greater.  You develop worsening pain at or near the incision site. MAKE SURE YOU:   Understand these instructions.  Will watch your condition.  Will get help right away if you are not doing well or get worse. Document Released: 04/13/2007 Document Revised: 02/23/2012 Document Reviewed: 06/14/2007 ExitCare Patient Information 2014 ExitCare, [place].   ________________________________________________________________________    SEE CHG INSTRUCTION SHEET    Please read over the following fact sheets that you were given: MRSA Information, coughing and deep breathing exercises, leg exercises

## 2014-07-03 NOTE — Progress Notes (Signed)
12/2011 ECHO in EPIC  LOV with DR Hochrein - 02/27/14 EPIC  EKG 02/27/14 EPIC

## 2014-07-04 NOTE — Progress Notes (Addendum)
Patient scheduled to have root canal on 07/03/2014 and office visit with Dr Zada Girt on 07/04/2014.  Attempted to request office visit note from Dr Zada Girt.  Office will reopen on 07/05/2014 am.

## 2014-07-10 ENCOUNTER — Inpatient Hospital Stay (HOSPITAL_COMMUNITY): Payer: Medicare Other | Admitting: Anesthesiology

## 2014-07-10 ENCOUNTER — Encounter (HOSPITAL_COMMUNITY): Payer: Self-pay | Admitting: *Deleted

## 2014-07-10 ENCOUNTER — Encounter (HOSPITAL_COMMUNITY): Payer: Medicare Other | Admitting: Anesthesiology

## 2014-07-10 ENCOUNTER — Encounter (HOSPITAL_COMMUNITY)
Admission: RE | Disposition: A | Discharge status: Home Health | Payer: Self-pay | Source: Ambulatory Visit | Attending: Orthopedic Surgery

## 2014-07-10 ENCOUNTER — Inpatient Hospital Stay (HOSPITAL_COMMUNITY)
Admission: RE | Admit: 2014-07-10 | Discharge: 2014-07-12 | DRG: 470 | Disposition: A | Discharge status: Home Health | Payer: Medicare Other | Source: Ambulatory Visit | Attending: Orthopedic Surgery | Admitting: Orthopedic Surgery

## 2014-07-10 DIAGNOSIS — I10 Essential (primary) hypertension: Secondary | ICD-10-CM | POA: Diagnosis present

## 2014-07-10 DIAGNOSIS — Z79899 Other long term (current) drug therapy: Secondary | ICD-10-CM

## 2014-07-10 DIAGNOSIS — Z7982 Long term (current) use of aspirin: Secondary | ICD-10-CM | POA: Diagnosis not present

## 2014-07-10 DIAGNOSIS — M1710 Unilateral primary osteoarthritis, unspecified knee: Principal | ICD-10-CM | POA: Diagnosis present

## 2014-07-10 DIAGNOSIS — M898X9 Other specified disorders of bone, unspecified site: Secondary | ICD-10-CM | POA: Diagnosis present

## 2014-07-10 DIAGNOSIS — M179 Osteoarthritis of knee, unspecified: Secondary | ICD-10-CM | POA: Diagnosis present

## 2014-07-10 DIAGNOSIS — Z96651 Presence of right artificial knee joint: Secondary | ICD-10-CM

## 2014-07-10 DIAGNOSIS — Z8249 Family history of ischemic heart disease and other diseases of the circulatory system: Secondary | ICD-10-CM | POA: Diagnosis not present

## 2014-07-10 DIAGNOSIS — Z9861 Coronary angioplasty status: Secondary | ICD-10-CM

## 2014-07-10 DIAGNOSIS — E785 Hyperlipidemia, unspecified: Secondary | ICD-10-CM | POA: Diagnosis present

## 2014-07-10 DIAGNOSIS — R110 Nausea: Secondary | ICD-10-CM | POA: Diagnosis not present

## 2014-07-10 DIAGNOSIS — Z885 Allergy status to narcotic agent status: Secondary | ICD-10-CM

## 2014-07-10 DIAGNOSIS — K219 Gastro-esophageal reflux disease without esophagitis: Secondary | ICD-10-CM | POA: Diagnosis present

## 2014-07-10 DIAGNOSIS — I2510 Atherosclerotic heart disease of native coronary artery without angina pectoris: Secondary | ICD-10-CM | POA: Diagnosis present

## 2014-07-10 DIAGNOSIS — M25569 Pain in unspecified knee: Secondary | ICD-10-CM | POA: Diagnosis present

## 2014-07-10 DIAGNOSIS — N400 Enlarged prostate without lower urinary tract symptoms: Secondary | ICD-10-CM | POA: Diagnosis present

## 2014-07-10 DIAGNOSIS — M1711 Unilateral primary osteoarthritis, right knee: Secondary | ICD-10-CM

## 2014-07-10 HISTORY — PX: TOTAL KNEE ARTHROPLASTY: SHX125

## 2014-07-10 LAB — TYPE AND SCREEN
ABO/RH(D): A POS
ANTIBODY SCREEN: NEGATIVE

## 2014-07-10 SURGERY — ARTHROPLASTY, KNEE, TOTAL
Anesthesia: Spinal | Site: Knee | Laterality: Right

## 2014-07-10 MED ORDER — MENTHOL 3 MG MT LOZG: 1.0000 | LOZENGE | OROMUCOSAL | Status: DC | PRN

## 2014-07-10 MED ORDER — METOCLOPRAMIDE HCL 10 MG PO TABS: 5.0000 mg | ORAL_TABLET | Freq: Three times a day (TID) | ORAL | Status: DC | PRN

## 2014-07-10 MED ORDER — ALPRAZOLAM 0.5 MG PO TABS
0.5000 mg | ORAL_TABLET | Freq: Every day | ORAL | Status: DC
  Administered 2014-07-10 – 2014-07-11 (×2): 0.5 mg via ORAL
  Filled 2014-07-10 (×2): qty 1

## 2014-07-10 MED ORDER — METOCLOPRAMIDE HCL 5 MG/ML IJ SOLN: 5.0000 mg | Freq: Three times a day (TID) | INTRAMUSCULAR | Status: DC | PRN

## 2014-07-10 MED ORDER — ONDANSETRON HCL 4 MG/2ML IJ SOLN: 4.0000 mg | Freq: Four times a day (QID) | INTRAMUSCULAR | Status: DC | PRN

## 2014-07-10 MED ORDER — PHENYLEPHRINE HCL 10 MG/ML IJ SOLN
INTRAMUSCULAR | Status: DC | PRN
  Administered 2014-07-10 (×2): 80 ug via INTRAVENOUS

## 2014-07-10 MED ORDER — BISACODYL 10 MG RE SUPP: 10.0000 mg | Freq: Every day | RECTAL | Status: DC | PRN

## 2014-07-10 MED ORDER — HYDROMORPHONE HCL 2 MG PO TABS
2.0000 mg | ORAL_TABLET | ORAL | Status: DC | PRN
  Administered 2014-07-10 – 2014-07-11 (×2): 2 mg via ORAL
  Administered 2014-07-12: 4 mg via ORAL
  Administered 2014-07-12: 2 mg via ORAL
  Administered 2014-07-12 (×3): 4 mg via ORAL
  Filled 2014-07-10: qty 1
  Filled 2014-07-10 (×4): qty 2
  Filled 2014-07-10 (×2): qty 1

## 2014-07-10 MED ORDER — EPHEDRINE SULFATE 50 MG/ML IJ SOLN
INTRAMUSCULAR | Status: DC | PRN
  Administered 2014-07-10 (×3): 5 mg via INTRAVENOUS

## 2014-07-10 MED ORDER — OXYCODONE HCL 5 MG/5ML PO SOLN
5.0000 mg | Freq: Once | ORAL | Status: DC | PRN
  Filled 2014-07-10: qty 5

## 2014-07-10 MED ORDER — FLEET ENEMA 7-19 GM/118ML RE ENEM
1.0000 | ENEMA | Freq: Once | RECTAL | Status: AC | PRN
Start: 2014-07-10 — End: 2014-07-10

## 2014-07-10 MED ORDER — CEFAZOLIN SODIUM-DEXTROSE 2-3 GM-% IV SOLR
INTRAVENOUS | Status: AC
  Filled 2014-07-10: qty 50

## 2014-07-10 MED ORDER — BUPIVACAINE HCL 0.25 % IJ SOLN
INTRAMUSCULAR | Status: DC | PRN
  Administered 2014-07-10: 25 mL

## 2014-07-10 MED ORDER — LIDOCAINE HCL (CARDIAC) 20 MG/ML IV SOLN
INTRAVENOUS | Status: AC
  Filled 2014-07-10: qty 5

## 2014-07-10 MED ORDER — SODIUM CHLORIDE 0.9 % IJ SOLN
INTRAMUSCULAR | Status: AC
  Filled 2014-07-10: qty 50

## 2014-07-10 MED ORDER — DOCUSATE SODIUM 100 MG PO CAPS
100.0000 mg | ORAL_CAPSULE | Freq: Two times a day (BID) | ORAL | Status: DC
  Administered 2014-07-10 – 2014-07-12 (×4): 100 mg via ORAL

## 2014-07-10 MED ORDER — ACETAMINOPHEN 650 MG RE SUPP: 650.0000 mg | Freq: Four times a day (QID) | RECTAL | Status: DC | PRN

## 2014-07-10 MED ORDER — PANTOPRAZOLE SODIUM 40 MG PO TBEC
40.0000 mg | DELAYED_RELEASE_TABLET | Freq: Every day | ORAL | Status: DC
  Administered 2014-07-11 – 2014-07-12 (×2): 40 mg via ORAL
  Filled 2014-07-10 (×2): qty 1

## 2014-07-10 MED ORDER — BUPIVACAINE LIPOSOME 1.3 % IJ SUSP
20.0000 mL | Freq: Once | INTRAMUSCULAR | Status: AC
  Administered 2014-07-10: 20 mL
  Filled 2014-07-10: qty 20

## 2014-07-10 MED ORDER — TRANEXAMIC ACID 100 MG/ML IV SOLN
2000.0000 mg | Freq: Once | INTRAVENOUS | Status: DC
  Filled 2014-07-10: qty 20

## 2014-07-10 MED ORDER — POLYETHYLENE GLYCOL 3350 17 G PO PACK: 17.0000 g | PACK | Freq: Every day | ORAL | Status: DC | PRN

## 2014-07-10 MED ORDER — PROPOFOL INFUSION 10 MG/ML OPTIME
INTRAVENOUS | Status: DC | PRN
  Administered 2014-07-10: 100 ug/kg/min via INTRAVENOUS

## 2014-07-10 MED ORDER — SODIUM CHLORIDE 0.9 % IJ SOLN
INTRAMUSCULAR | Status: DC | PRN
  Administered 2014-07-10: 14 mL

## 2014-07-10 MED ORDER — BUPIVACAINE IN DEXTROSE 0.75-8.25 % IT SOLN
INTRATHECAL | Status: DC | PRN
  Administered 2014-07-10: 2 mL via INTRATHECAL

## 2014-07-10 MED ORDER — ACETAMINOPHEN 325 MG PO TABS
650.0000 mg | ORAL_TABLET | Freq: Four times a day (QID) | ORAL | Status: DC | PRN
  Administered 2014-07-11 – 2014-07-12 (×4): 650 mg via ORAL
  Filled 2014-07-10 (×5): qty 2

## 2014-07-10 MED ORDER — BUPIVACAINE HCL (PF) 0.25 % IJ SOLN
INTRAMUSCULAR | Status: AC
  Filled 2014-07-10: qty 30

## 2014-07-10 MED ORDER — PROPOFOL 10 MG/ML IV BOLUS
INTRAVENOUS | Status: AC
  Filled 2014-07-10: qty 20

## 2014-07-10 MED ORDER — ONDANSETRON HCL 4 MG/2ML IJ SOLN
INTRAMUSCULAR | Status: DC | PRN
  Administered 2014-07-10: 4 mg via INTRAVENOUS

## 2014-07-10 MED ORDER — LACTATED RINGERS IV SOLN
INTRAVENOUS | Status: DC
  Administered 2014-07-10: 1000 mL via INTRAVENOUS
  Administered 2014-07-10: 11:00:00 via INTRAVENOUS

## 2014-07-10 MED ORDER — HYDROMORPHONE HCL PF 1 MG/ML IJ SOLN
0.5000 mg | INTRAMUSCULAR | Status: DC | PRN
  Administered 2014-07-10 – 2014-07-12 (×2): 0.5 mg via INTRAVENOUS
  Filled 2014-07-10 (×2): qty 1

## 2014-07-10 MED ORDER — PHENOL 1.4 % MT LIQD: 1.0000 | OROMUCOSAL | Status: DC | PRN

## 2014-07-10 MED ORDER — METHOCARBAMOL 1000 MG/10ML IJ SOLN
500.0000 mg | Freq: Four times a day (QID) | INTRAVENOUS | Status: DC | PRN
  Administered 2014-07-10: 500 mg via INTRAVENOUS
  Filled 2014-07-10: qty 5

## 2014-07-10 MED ORDER — TRANEXAMIC ACID 100 MG/ML IV SOLN
1000.0000 mg | INTRAVENOUS | Status: DC | PRN
  Administered 2014-07-10: 1000 mg via INTRAVENOUS

## 2014-07-10 MED ORDER — 0.9 % SODIUM CHLORIDE (POUR BTL) OPTIME
TOPICAL | Status: DC | PRN
  Administered 2014-07-10: 1000 mL

## 2014-07-10 MED ORDER — KETOROLAC TROMETHAMINE 15 MG/ML IJ SOLN
7.5000 mg | Freq: Four times a day (QID) | INTRAMUSCULAR | Status: AC | PRN
  Administered 2014-07-10 – 2014-07-11 (×2): 7.5 mg via INTRAVENOUS
  Filled 2014-07-10 (×2): qty 1

## 2014-07-10 MED ORDER — OXYCODONE HCL 5 MG PO TABS: 5.0000 mg | ORAL_TABLET | Freq: Once | ORAL | Status: DC | PRN

## 2014-07-10 MED ORDER — FENTANYL CITRATE 0.05 MG/ML IJ SOLN
INTRAMUSCULAR | Status: AC
  Filled 2014-07-10: qty 2

## 2014-07-10 MED ORDER — ACETAMINOPHEN 500 MG PO TABS
1000.0000 mg | ORAL_TABLET | Freq: Four times a day (QID) | ORAL | Status: AC
  Administered 2014-07-10 – 2014-07-11 (×4): 1000 mg via ORAL
  Filled 2014-07-10 (×5): qty 2

## 2014-07-10 MED ORDER — DIPHENHYDRAMINE HCL 12.5 MG/5ML PO ELIX: 12.5000 mg | ORAL_SOLUTION | ORAL | Status: DC | PRN

## 2014-07-10 MED ORDER — PROMETHAZINE HCL 25 MG/ML IJ SOLN: 6.2500 mg | INTRAMUSCULAR | Status: DC | PRN

## 2014-07-10 MED ORDER — MEPERIDINE HCL 50 MG/ML IJ SOLN: 6.2500 mg | INTRAMUSCULAR | Status: DC | PRN

## 2014-07-10 MED ORDER — SODIUM CHLORIDE 0.9 % IV SOLN
INTRAVENOUS | Status: DC
  Administered 2014-07-10 – 2014-07-11 (×3): via INTRAVENOUS

## 2014-07-10 MED ORDER — RIVAROXABAN 10 MG PO TABS
10.0000 mg | ORAL_TABLET | Freq: Every day | ORAL | Status: DC
  Administered 2014-07-11 – 2014-07-12 (×2): 10 mg via ORAL
  Filled 2014-07-10 (×3): qty 1

## 2014-07-10 MED ORDER — DEXAMETHASONE SODIUM PHOSPHATE 10 MG/ML IJ SOLN
INTRAMUSCULAR | Status: DC | PRN
  Administered 2014-07-10: 10 mg via INTRAVENOUS

## 2014-07-10 MED ORDER — SODIUM CHLORIDE 0.9 % IR SOLN
Status: DC | PRN
  Administered 2014-07-10: 1000 mL

## 2014-07-10 MED ORDER — DEXAMETHASONE SODIUM PHOSPHATE 10 MG/ML IJ SOLN
INTRAMUSCULAR | Status: AC
  Filled 2014-07-10: qty 1

## 2014-07-10 MED ORDER — DEXAMETHASONE 6 MG PO TABS
10.0000 mg | ORAL_TABLET | Freq: Every day | ORAL | Status: AC
  Administered 2014-07-11: 10 mg via ORAL
  Filled 2014-07-10: qty 1

## 2014-07-10 MED ORDER — ACETAMINOPHEN 10 MG/ML IV SOLN
1000.0000 mg | Freq: Once | INTRAVENOUS | Status: AC
  Administered 2014-07-10: 1000 mg via INTRAVENOUS
  Filled 2014-07-10: qty 100

## 2014-07-10 MED ORDER — METHOCARBAMOL 500 MG PO TABS
500.0000 mg | ORAL_TABLET | Freq: Four times a day (QID) | ORAL | Status: DC | PRN
  Administered 2014-07-10 – 2014-07-12 (×7): 500 mg via ORAL
  Filled 2014-07-10 (×8): qty 1

## 2014-07-10 MED ORDER — TAMSULOSIN HCL 0.4 MG PO CAPS
0.4000 mg | ORAL_CAPSULE | Freq: Two times a day (BID) | ORAL | Status: DC
  Administered 2014-07-10 – 2014-07-12 (×4): 0.4 mg via ORAL
  Filled 2014-07-10 (×5): qty 1

## 2014-07-10 MED ORDER — DEXAMETHASONE SODIUM PHOSPHATE 10 MG/ML IJ SOLN
10.0000 mg | Freq: Every day | INTRAMUSCULAR | Status: AC
  Filled 2014-07-10: qty 1

## 2014-07-10 MED ORDER — METOPROLOL SUCCINATE ER 25 MG PO TB24
25.0000 mg | ORAL_TABLET | Freq: Every day | ORAL | Status: DC
  Administered 2014-07-11 – 2014-07-12 (×2): 25 mg via ORAL
  Filled 2014-07-10 (×3): qty 1

## 2014-07-10 MED ORDER — MIDAZOLAM HCL 2 MG/2ML IJ SOLN
INTRAMUSCULAR | Status: AC
  Filled 2014-07-10: qty 2

## 2014-07-10 MED ORDER — ATORVASTATIN CALCIUM 20 MG PO TABS
20.0000 mg | ORAL_TABLET | Freq: Every morning | ORAL | Status: DC
  Administered 2014-07-11 – 2014-07-12 (×2): 20 mg via ORAL
  Filled 2014-07-10 (×2): qty 1

## 2014-07-10 MED ORDER — NITROGLYCERIN 0.4 MG SL SUBL: 0.4000 mg | SUBLINGUAL_TABLET | SUBLINGUAL | Status: DC | PRN

## 2014-07-10 MED ORDER — PHENYLEPHRINE 40 MCG/ML (10ML) SYRINGE FOR IV PUSH (FOR BLOOD PRESSURE SUPPORT)
PREFILLED_SYRINGE | INTRAVENOUS | Status: AC
  Filled 2014-07-10: qty 10

## 2014-07-10 MED ORDER — ONDANSETRON HCL 4 MG/2ML IJ SOLN
INTRAMUSCULAR | Status: AC
  Filled 2014-07-10: qty 2

## 2014-07-10 MED ORDER — ONDANSETRON HCL 4 MG PO TABS: 4.0000 mg | ORAL_TABLET | Freq: Four times a day (QID) | ORAL | Status: DC | PRN

## 2014-07-10 MED ORDER — LIDOCAINE HCL (CARDIAC) 20 MG/ML IV SOLN
INTRAVENOUS | Status: DC | PRN
Start: 2014-07-10 — End: 2014-07-10
  Administered 2014-07-10: 50 mg via INTRAVENOUS

## 2014-07-10 MED ORDER — CEFAZOLIN SODIUM-DEXTROSE 2-3 GM-% IV SOLR
2.0000 g | Freq: Once | INTRAVENOUS | Status: AC
  Administered 2014-07-10: 2 g via INTRAVENOUS

## 2014-07-10 MED ORDER — CEFAZOLIN SODIUM-DEXTROSE 2-3 GM-% IV SOLR
2.0000 g | Freq: Four times a day (QID) | INTRAVENOUS | Status: AC
  Administered 2014-07-10 (×2): 2 g via INTRAVENOUS
  Filled 2014-07-10 (×2): qty 50

## 2014-07-10 MED ORDER — HYDROMORPHONE HCL PF 1 MG/ML IJ SOLN: 0.2500 mg | INTRAMUSCULAR | Status: DC | PRN

## 2014-07-10 SURGICAL SUPPLY — 64 items
BAG SPEC THK2 15X12 ZIP CLS (MISCELLANEOUS) ×1
BAG ZIPLOCK 12X15 (MISCELLANEOUS) ×2 IMPLANT
BANDAGE ELASTIC 6 VELCRO ST LF (GAUZE/BANDAGES/DRESSINGS) ×2 IMPLANT
BANDAGE ESMARK 6X9 LF (GAUZE/BANDAGES/DRESSINGS) ×1 IMPLANT
BLADE SAG 18X100X1.27 (BLADE) ×2 IMPLANT
BLADE SAW SGTL 11.0X1.19X90.0M (BLADE) ×2 IMPLANT
BNDG CMPR 9X6 STRL LF SNTH (GAUZE/BANDAGES/DRESSINGS) ×1
BNDG ESMARK 6X9 LF (GAUZE/BANDAGES/DRESSINGS) ×2
BOWL SMART MIX CTS (DISPOSABLE) ×2 IMPLANT
CAPT RP KNEE ×1 IMPLANT
CEMENT HV SMART SET (Cement) ×3 IMPLANT
CLOSURE STERI-STRIP 1/4X4 (GAUZE/BANDAGES/DRESSINGS) ×1 IMPLANT
CUFF TOURN SGL QUICK 34 (TOURNIQUET CUFF) ×2
CUFF TRNQT CYL 34X4X40X1 (TOURNIQUET CUFF) ×1 IMPLANT
DECANTER SPIKE VIAL GLASS SM (MISCELLANEOUS) ×2 IMPLANT
DRAPE EXTREMITY T 121X128X90 (DRAPE) ×2 IMPLANT
DRAPE POUCH INSTRU U-SHP 10X18 (DRAPES) ×2 IMPLANT
DRAPE U-SHAPE 47X51 STRL (DRAPES) ×2 IMPLANT
DRSG ADAPTIC 3X8 NADH LF (GAUZE/BANDAGES/DRESSINGS) ×2 IMPLANT
DRSG PAD ABDOMINAL 8X10 ST (GAUZE/BANDAGES/DRESSINGS) ×1 IMPLANT
DURAPREP 26ML APPLICATOR (WOUND CARE) ×2 IMPLANT
ELECT REM PT RETURN 9FT ADLT (ELECTROSURGICAL) ×2
ELECTRODE REM PT RTRN 9FT ADLT (ELECTROSURGICAL) ×1 IMPLANT
EVACUATOR 1/8 PVC DRAIN (DRAIN) ×2 IMPLANT
FACESHIELD WRAPAROUND (MASK) ×10 IMPLANT
FACESHIELD WRAPAROUND OR TEAM (MASK) ×5 IMPLANT
GAUZE SPONGE 4X4 12PLY STRL (GAUZE/BANDAGES/DRESSINGS) ×1 IMPLANT
GAUZE XEROFORM 4X4 STRL (GAUZE/BANDAGES/DRESSINGS) ×1 IMPLANT
GLOVE BIO SURGEON STRL SZ7.5 (GLOVE) IMPLANT
GLOVE BIO SURGEON STRL SZ8 (GLOVE) ×2 IMPLANT
GLOVE BIOGEL PI IND STRL 6.5 (GLOVE) IMPLANT
GLOVE BIOGEL PI IND STRL 8 (GLOVE) ×1 IMPLANT
GLOVE BIOGEL PI INDICATOR 6.5 (GLOVE)
GLOVE BIOGEL PI INDICATOR 8 (GLOVE) ×1
GLOVE SURG SS PI 6.5 STRL IVOR (GLOVE) IMPLANT
GOWN STRL REUS W/TWL LRG LVL3 (GOWN DISPOSABLE) ×2 IMPLANT
GOWN STRL REUS W/TWL XL LVL3 (GOWN DISPOSABLE) IMPLANT
HANDPIECE INTERPULSE COAX TIP (DISPOSABLE) ×2
IMMOBILIZER KNEE 20 (SOFTGOODS) ×2
IMMOBILIZER KNEE 20 THIGH 36 (SOFTGOODS) ×1 IMPLANT
KIT BASIN OR (CUSTOM PROCEDURE TRAY) ×2 IMPLANT
MANIFOLD NEPTUNE II (INSTRUMENTS) ×2 IMPLANT
NDL SAFETY ECLIPSE 18X1.5 (NEEDLE) ×2 IMPLANT
NEEDLE HYPO 18GX1.5 SHARP (NEEDLE) ×4
NS IRRIG 1000ML POUR BTL (IV SOLUTION) ×2 IMPLANT
PACK TOTAL JOINT (CUSTOM PROCEDURE TRAY) ×2 IMPLANT
PAD ABD 8X10 STRL (GAUZE/BANDAGES/DRESSINGS) ×1 IMPLANT
PADDING CAST COTTON 6X4 STRL (CAST SUPPLIES) ×4 IMPLANT
POSITIONER SURGICAL ARM (MISCELLANEOUS) ×2 IMPLANT
SET HNDPC FAN SPRY TIP SCT (DISPOSABLE) ×1 IMPLANT
STRIP CLOSURE SKIN 1/2X4 (GAUZE/BANDAGES/DRESSINGS) ×3 IMPLANT
SUCTION FRAZIER 12FR DISP (SUCTIONS) ×2 IMPLANT
SUT MNCRL AB 4-0 PS2 18 (SUTURE) ×2 IMPLANT
SUT VIC AB 2-0 CT1 27 (SUTURE) ×6
SUT VIC AB 2-0 CT1 TAPERPNT 27 (SUTURE) ×3 IMPLANT
SUT VLOC 180 0 24IN GS25 (SUTURE) ×2 IMPLANT
SYRINGE 20CC LL (MISCELLANEOUS) ×2 IMPLANT
SYRINGE 60CC LL (MISCELLANEOUS) ×2 IMPLANT
TOWEL OR 17X26 10 PK STRL BLUE (TOWEL DISPOSABLE) ×2 IMPLANT
TOWEL OR NON WOVEN STRL DISP B (DISPOSABLE) IMPLANT
TRAY FOLEY CATH 14FRSI W/METER (CATHETERS) ×1 IMPLANT
TRAY FOLEY CATH 16FRSI W/METER (SET/KITS/TRAYS/PACK) ×1 IMPLANT
WATER STERILE IRR 1500ML POUR (IV SOLUTION) ×2 IMPLANT
WRAP KNEE MAXI GEL POST OP (GAUZE/BANDAGES/DRESSINGS) ×2 IMPLANT

## 2014-07-10 NOTE — Interval H&P Note (Signed)
History and Physical Interval Note:  07/10/2014 9:38 AM  Peter Morgan  has presented today for surgery, with the diagnosis of OA OF RIGHT KNEE  The various methods of treatment have been discussed with the patient and family. After consideration of risks, benefits and other options for treatment, the patient has consented to  Procedure(s): RIGHT TOTAL KNEE ARTHROPLASTY (Right) as a surgical intervention .  The patient's history has been reviewed, patient examined, no change in status, stable for surgery.  I have reviewed the patient's chart and labs.  Questions were answered to the patient's satisfaction.     Gearlean Alf

## 2014-07-10 NOTE — Anesthesia Procedure Notes (Signed)
Spinal  Patient location during procedure: OR End time: 07/10/2014 10:31 AM Staffing CRNA/Resident: Noralyn Pick Performed by: anesthesiologist and resident/CRNA  Preanesthetic Checklist Completed: patient identified, site marked, surgical consent, pre-op evaluation, timeout performed, IV checked, risks and benefits discussed and monitors and equipment checked Spinal Block Patient position: sitting Prep: Betadine Patient monitoring: heart rate, continuous pulse ox and blood pressure Approach: midline Location: L3-4 Injection technique: single-shot Needle Needle type: Sprotte and Pencil-Tip  Needle gauge: 24 G Needle length: 9 cm Additional Notes Expiration date of kit checked and confirmed. Patient tolerated procedure well, without complications.

## 2014-07-10 NOTE — Evaluation (Signed)
Physical Therapy Evaluation Patient Details Name: Peter Morgan MRN: [id number] DOB: 1946/06/16 Today's Date: 07/10/2014   History of Present Illness  pt admitted for RTKA . Had recent [place] exactly one year ago and p/o complication with bradycardia, and hypotension.   Clinical Impression  Pt s/p RTKA presents with some orthostatic hypotension today upon sitting EOB and transferring today. Pt to benefit from continued PT to increase strength, ROM and mobility to safely return home with wife .     Follow Up Recommendations Home health PT    Equipment Recommendations  None recommended by PT    Recommendations for Other Services       Precautions / Restrictions        Mobility  Bed Mobility Overal bed mobility: Needs Assistance Bed Mobility: Supine to Sit;Sit to Supine     Supine to sit: Min guard     General bed mobility comments: assisted LE , pt was Independent with Upper body  Transfers Overall transfer level: Needs assistance Equipment used: Rolling walker (2 wheeled) Transfers: Sit to/from Stand Sit to Stand: Min guard         General transfer comment: pt moved well, just cues for hand placment and safety adn assist for as needed due to feeling faint (drop in BPs)   Ambulation/Gait Ambulation/Gait assistance: Min assist Ambulation Distance (Feet): 3 Feet Assistive device: Rolling walker (2 wheeled) Gait Pattern/deviations: Step-to pattern     General Gait Details: limited due to drop in BPs and sympotomatic. second person there for safety and chair  Stairs            Wheelchair Mobility    Modified Rankin (Stroke Patients Only)       Balance                                             Pertinent Vitals/Pain 5/10 in R knee    Home Living Family/patient expects to be discharged to:: Private residence Living Arrangements: Spouse/significant other Available Help at Discharge: Family Type of Home: House Home Access:  Stairs to enter Entrance Stairs-Rails: None Entrance Stairs-Number of Steps: 3 Home Layout: Two level;Able to live on main level with bedroom/bathroom Home Equipment: Gilford Rile - 2 wheels;Bedside commode Additional Comments: active person and still works some as well    Prior Function Level of Independence: Independent               Hand Dominance        Extremity/Trunk Assessment               Lower Extremity Assessment: RLE deficits/detail RLE Deficits / Details: limited grossly to 0-50 due to dressing and pain in knee area. R ankle ROM not quite to neutral and AROM DF 3-/5 . Has been this way for many , many years due to R leg accident. does not currently use an assisted lift or AFO.       Communication   Communication: No difficulties  Cognition Arousal/Alertness: Awake/alert Behavior During Therapy: WFL for tasks assessed/performed Overall Cognitive Status: Within Functional Limits for tasks assessed                      General Comments      Exercises Total Joint Exercises Ankle Circles/Pumps: AAROM;10 reps;Right Quad Sets: AROM;5 reps;Supine;Right Heel Slides: AAROM;Supine;Right;5 reps Straight Leg Raises: AAROM;Supine;Right;5 reps Goniometric ROM:  0-50      Assessment/Plan    PT Assessment Patient needs continued PT services  PT Diagnosis Difficulty walking   PT Problem List Decreased strength;Decreased range of motion;Decreased activity tolerance;Decreased mobility  PT Treatment Interventions DME instruction;Gait training;Stair training;Functional mobility training;Therapeutic activities;Therapeutic exercise;Patient/family education   PT Goals (Current goals can be found in the Care Plan section) Acute Rehab PT Goals Patient Stated Goal: to get back to being active as soon as possible PT Goal Formulation: With patient Time For Goal Achievement: 07/17/14 Potential to Achieve Goals: Good    Frequency 7X/week   Barriers to discharge         Co-evaluation               End of Session Equipment Utilized During Treatment: Gait belt Activity Tolerance: Treatment limited secondary to medical complications (Comment) Patient left: in chair;with family/visitor present (nurse monitoring BPs) Nurse Communication: Mobility status         Time: 7096-4383 PT Time Calculation (min): 38 min   Charges:   PT Evaluation $Initial PT Evaluation Tier I: 1 Procedure PT Treatments $Therapeutic Exercise: 8-22 mins $Therapeutic Activity: 8-22 mins   PT G CodesClide Dales 07/10/2014, 6:57 PM Clide Dales, PT Pager: [phone] 07/10/2014

## 2014-07-10 NOTE — Plan of Care (Signed)
Problem: Consults Goal: Diagnosis- Total Joint Replacement Primary Total Knee     

## 2014-07-10 NOTE — Anesthesia Preprocedure Evaluation (Addendum)
Anesthesia Evaluation  Patient identified by MRN, date of birth, ID band Patient awake    Reviewed: Allergy & Precautions, H&P , NPO status , Patient's Chart, lab work & pertinent test results  History of Anesthesia Complications (+) PONV and history of anesthetic complications  Airway Mallampati: II TM Distance: >3 FB Neck ROM: Full    Dental no notable dental hx.    Pulmonary neg pulmonary ROS,  breath sounds clear to auscultation  Pulmonary exam normal       Cardiovascular hypertension, Pt. on medications - angina+ CAD and + Cardiac Stents Rhythm:Regular Rate:Normal     Neuro/Psych negative neurological ROS  negative psych ROS   GI/Hepatic Neg liver ROS, GERD-  ,  Endo/Other  negative endocrine ROS  Renal/GU negative Renal ROS     Musculoskeletal negative musculoskeletal ROS (+)   Abdominal   Peds  Hematology negative hematology ROS (+) anemia ,   Anesthesia Other Findings   Reproductive/Obstetrics negative OB ROS                          Anesthesia Physical  Anesthesia Plan  ASA: III  Anesthesia Plan: Spinal   Post-op Pain Management:    Induction:   Airway Management Planned: Simple Face Mask  Additional Equipment:   Intra-op Plan:   Post-operative Plan:   Informed Consent: I have reviewed the patients History and Physical, chart, labs and discussed the procedure including the risks, benefits and alternatives for the proposed anesthesia with the patient or authorized representative who has indicated his/her understanding and acceptance.   Dental advisory given  Plan Discussed with: CRNA  Anesthesia Plan Comments:         Anesthesia Quick Evaluation

## 2014-07-10 NOTE — Progress Notes (Signed)
Order for consent per Liane Comber RN in [place]

## 2014-07-10 NOTE — Progress Notes (Signed)
Video system down(no answer on phone)  Unable to show safety video.

## 2014-07-10 NOTE — H&P (View-Only) (Signed)
Peter Morgan. Peter Morgan DOB: November 15, 1946 Married / Language: English / Race: White Male Date of Admission:  07-10-2014 Chief Complaint;  Right Knee Pain History of Present Illness  The patient is a 68 year old male who comes in for a preoperative history and physical. The patient is scheduled for a right total knee arthroplasty to be performed by Dr. Dione Morgan. Aluisio, MD at [facility] on Monday, July 10, 2014. The patient is a 68 year old male presenting several months out from left total knee arthroplasty. The patient states that he is doing well at this time. The pain is under excellent control at this time and describe their pain as mild (soreness and stiffness). They are currently on Tylenol for their pain. The patient is currently doing home exercise program. The patient feels that they are progressing well at this time. Note for "Post TKA": Still difficulty w/ stairs. He has undergone a recent cortisone injection in right knee. Despite having the cortisone, the right knee continues to be a problem. He feels the left knee is doing very well at this time. Still has some occasional soreness, but nothing that is holding him down. He is pleased with how things are going. He states that the right knee does give him trouble and now reached a point where he would like to proceed with the knee replacement at this time. They have been treated conservatively in the past for the above stated problem and despite conservative measures, they continue to have progressive pain and severe functional limitations and dysfunction. They have failed non-operative management including home exercise, medications, and injections. It is felt that they would benefit from undergoing total joint replacement. Risks and benefits of the procedure have been discussed with the patient and they elect to proceed with surgery. There are no active contraindications to surgery such as ongoing infection or rapidly progressive neurological  disease.  Allergies:  No Know Drug Allergies  Intolerances: TraMADol HCl *ANALGESICS - OPIOID* Nausea. Codeine Derivatives Nausea. OxyCODONE HCl *ANALGESICS - OPIOID* Nausea.  Problem List/Past Medical  Status post total knee replacement, left Aftercare following joint replacement surgery (V54.81  Z47.1) Primary osteoarthritis of both knees (715.16  M17.0) Osteoarthritis of right knee (715.96  M17.9) Acute low back pain (724.2  M54.5) Coronary artery disease Urinary Frequency  Family History  Heart Disease father Congestive Heart Failure Father. father  Social History Living situation live with spouse Drug/Alcohol Rehab (Previously) no Number of flights of stairs before winded greater than 5 Marital status married Post-Surgical Plans Home Alcohol use current drinker; drinks wine; 5-7 per week Illicit drug use no Tobacco use never smoker Exercise Exercises daily; does running / walking and gym / weights Pain Contract no Current work status working part time Clinical research associate [employer] Drug/Alcohol Rehab (Currently) no Children 1  Medication History  Tylenol Extra Strength (500MG  Tablet, Oral) Active. Flomax (0.4MG  Capsule, Oral every morning) Active. Atorvastatin Calcium (20MG  Tablet, Oral) Active. Clopidogrel Bisulfate (75MG  Tablet, Oral) Active. Metoprolol Succinate ER (50MG  Tablet ER 24HR, Oral) Active. (25mg ) Pantoprazole Sodium (40MG  Tablet DR, Oral) Active. Aspirin Low Strength (81MG  Tablet Chewable, Oral) Active. ALPRAZolam ER (0.5MG  Tablet ER 24HR, Oral) Active. Colace (100MG  Capsule, Oral) Active.  Past Surgical History Heart Stents ONE (99% Blockage) Straighten Nasal Septum Sinus Surgery Arthroscopy of Knee bilateral  Review of Systems General Not Present- Chills, Fatigue, Fever, Memory Loss, Night Sweats, Weight Gain and Weight Loss. Skin Not Present- Eczema, Hives, Itching, Lesions and  Rash. HEENT  Not Present- Dentures, Double Vision, Headache, Hearing Loss, Tinnitus and Visual Loss. Respiratory Not Present- Allergies, Chronic Cough, Coughing up blood, Shortness of breath at rest and Shortness of breath with exertion. Cardiovascular Not Present- Chest Pain, Difficulty Breathing Lying Down, Murmur, Palpitations, Racing/skipping heartbeats and Swelling. Gastrointestinal Not Present- Abdominal Pain, Bloody Stool, Constipation, Diarrhea, Difficulty Swallowing, Heartburn, Jaundice, Loss of appetitie, Nausea and Vomiting. Male Genitourinary Present- Urinary frequency and Urinating at Night. Not Present- Blood in Urine, Discharge, Flank Pain, Incontinence, Painful Urination, Urgency, Urinary Retention and Weak urinary stream. Musculoskeletal Present- Joint Pain and Morning Stiffness. Not Present- Back Pain, Joint Swelling, Muscle Pain and Spasms. Neurological Not Present- Blackout spells, Difficulty with balance, Dizziness, Paralysis, Tremor and Weakness. Psychiatric Not Present- Insomnia.   Vitals Weight: 178 lb Height: 69in Weight was reported by patient. Height was reported by patient. Body Surface Area: 1.98 m Body Mass Index: 26.29 kg/m BP: 122/68 (Sitting, Right Arm, Standard)    Physical Exam  The physical exam findings are as follows: Note:Patient is a 68 year old male with continued pain. Patient is accompanied today by his wife Peter Morgan.  General Mental Status -Alert, cooperative and good historian. General Appearance-pleasant, Not in acute distress. Orientation-Oriented X3. Build & Nutrition-Well nourished and Well developed.  Head and Neck Head-normocephalic, atraumatic . Neck Global Assessment - supple, no bruit auscultated on the right, no bruit auscultated on the left.  Eye Pupil - Bilateral-Regular and Round. Motion - Bilateral-EOMI.  Chest and Lung Exam Auscultation Breath sounds - clear at anterior chest wall and clear at  posterior chest wall. Adventitious sounds - No Adventitious sounds.  Cardiovascular Auscultation Rhythm - Regular rate and rhythm(occasional skipped or ectopic beat). Heart Sounds - S1 WNL and S2 WNL. Murmurs & Other Heart Sounds - Auscultation of the heart reveals - No Murmurs.  Abdomen Palpation/Percussion Tenderness - Abdomen is non-tender to palpation. Rigidity (guarding) - Abdomen is soft. Auscultation Auscultation of the abdomen reveals - Bowel sounds normal.  Male Genitourinary Note: Not done, not pertinent to present illness   Musculoskeletal Note: On exam he is a well developed male, alert and oriented in no apparent distress. Well developed male in no distress. The left knee looks great. Range of motion is 0-132 degrees with no swelling or instability. Right knee with varus deformity. Range 5-130 degrees. Moderate crepitus on range of motion. Tender medial greater than lateral with no instability. Preexisting right foot drop.  RADIOGRAPHS: AP and lateral right knee shows tricompartmental bone on bone arthritis in the right knee with no deformity. He also has a previous fracture of the right distal fibula with a lot of callus formation.   Assessment & Plan Osteoarthritis of right knee (715.96  M17.9) Impression: Right Knee Note:Plan is for a Right Total Knee Replacement by Dr. Wynelle Link.  Plan is to go home.  PCP - Dr. Nyoka Cowden  The patient will receive topical TXA (tranexamic acid) due to: CAD  Signed electronically by Ok Edwards, III PA-C

## 2014-07-10 NOTE — Op Note (Signed)
Pre-operative diagnosis- Osteoarthritis  Right knee(s)  Post-operative diagnosis- Osteoarthritis Right knee(s)  Procedure-  Right  Total Knee Arthroplasty  Surgeon- Dione Plover. Pattiann Solanki, MD  Assistant- Arlee Muslim, PA-C   Anesthesia-  Spinal  EBL-* No blood loss amount entered *   Drains Hemovac  Tourniquet time-  Total Tourniquet Time Documented: Thigh (Right) - 37 minutes Total: Thigh (Right) - 37 minutes     Complications- None  Condition-PACU - hemodynamically stable.   Brief Clinical Note  Peter Morgan is a 68 y.o. year old male with end stage OA of his right knee with progressively worsening pain and dysfunction. He has constant pain, with activity and at rest and significant functional deficits with difficulties even with ADLs. He has had extensive non-op management including analgesics, injections of cortisone and viscosupplements, and home exercise program, but remains in significant pain with significant dysfunction. Radiographs show bone on bone arthritis medial and patellofemoral. He presents now for right Total Knee Arthroplasty.    Procedure in detail---   The patient is brought into the operating room and positioned supine on the operating table. After successful administration of  Spinal,   a tourniquet is placed high on the  Right thigh(s) and the lower extremity is prepped and draped in the usual sterile fashion. Time out is performed by the operating team and then the  Right lower extremity is wrapped in Esmarch, knee flexed and the tourniquet inflated to 300 mmHg.       A midline incision is made with a ten blade through the subcutaneous tissue to the level of the extensor mechanism. A fresh blade is used to make a medial parapatellar arthrotomy. Soft tissue over the proximal medial tibia is subperiosteally elevated to the joint line with a knife and into the semimembranosus bursa with a Cobb elevator. Soft tissue over the proximal lateral tibia is elevated with  attention being paid to avoiding the patellar tendon on the tibial tubercle. The patella is everted, knee flexed 90 degrees and the ACL and PCL are removed. Findings are bone on bone medial and patellofemoral with massive global osteophytes.        The drill is used to create a starting hole in the distal femur and the canal is thoroughly irrigated with sterile saline to remove the fatty contents. The 5 degree Right  valgus alignment guide is placed into the femoral canal and the distal femoral cutting block is pinned to remove 10 mm off the distal femur. Resection is made with an oscillating saw.      The tibia is subluxed forward and the menisci are removed. The extramedullary alignment guide is placed referencing proximally at the medial aspect of the tibial tubercle and distally along the second metatarsal axis and tibial crest. The block is pinned to remove 2mm off the more deficient medial  side. Resection is made with an oscillating saw. Size 5is the most appropriate size for the tibia and the proximal tibia is prepared with the modular drill and keel punch for that size.      The femoral sizing guide is placed and size 5 is most appropriate. Rotation is marked off the epicondylar axis and confirmed by creating a rectangular flexion gap at 90 degrees. The size 5 cutting block is pinned in this rotation and the anterior, posterior and chamfer cuts are made with the oscillating saw. The intercondylar block is then placed and that cut is made.      Trial size 5 tibial component,  trial size 5 posterior stabilized femur and a 15  mm posterior stabilized rotating platform insert trial is placed. Full extension is achieved with excellent varus/valgus and anterior/posterior balance throughout full range of motion. The patella is everted and thickness measured to be 27  mm. Free hand resection is taken to 15 mm, a 41 template is placed, lug holes are drilled, trial patella is placed, and it tracks normally.  Osteophytes are removed off the posterior femur with the trial in place. All trials are removed and the cut bone surfaces prepared with pulsatile lavage. Cement is mixed and once ready for implantation, the size 5 tibial implant, size  5 posterior stabilized femoral component, and the size 41 patella are cemented in place and the patella is held with the clamp. The trial insert is placed and the knee held in full extension. The Exparel (20 ml mixed with 30 ml saline) and .25% Bupivicaine, are injected into the extensor mechanism, posterior capsule, medial and lateral gutters and subcutaneous tissues.  All extruded cement is removed and once the cement is hard the permanent 15 mm posterior stabilized rotating platform insert is placed into the tibial tray.      The wound is copiously irrigated with saline solution and the extensor mechanism closed over a hemovac drain with #1 V-loc suture. The tourniquet is released for a total tourniquet time of 36  minutes. Flexion against gravity is 140 degrees and the patella tracks normally. Subcutaneous tissue is closed with 2.0 vicryl and subcuticular with running 4.0 Monocryl. The incision is cleaned and dried and steri-strips and a bulky sterile dressing are applied. The limb is placed into a knee immobilizer and the patient is awakened and transported to recovery in stable condition.      Please note that a surgical assistant was a medical necessity for this procedure in order to perform it in a safe and expeditious manner. Surgical assistant was necessary to retract the ligaments and vital neurovascular structures to prevent injury to them and also necessary for proper positioning of the limb to allow for anatomic placement of the prosthesis.   Dione Plover Cloy Cozzens, MD    07/10/2014, 11:31 AM

## 2014-07-10 NOTE — Anesthesia Postprocedure Evaluation (Signed)
Anesthesia Post Note  Patient: Peter Morgan  Procedure(s) Performed: Procedure(s) (LRB): RIGHT TOTAL KNEE ARTHROPLASTY (Right)  Anesthesia type: Spinal  Patient location: PACU  Post pain: Pain level controlled  Post assessment: Post-op Vital signs reviewed  Last Vitals: BP 123/69  Pulse 58  Temp(Src) 36.4 C (Oral)  Resp 12  Ht 5\' 9"  (1.753 m)  Wt 168 lb (76.204 kg)  BMI 24.80 kg/m2  SpO2 95%  Post vital signs: Reviewed  Level of consciousness: sedated  Complications: No apparent anesthesia complications

## 2014-07-10 NOTE — Transfer of Care (Signed)
Immediate Anesthesia Transfer of Care Note  Patient: Peter Morgan  Procedure(s) Performed: Procedure(s): RIGHT TOTAL KNEE ARTHROPLASTY (Right)  Patient Location: PACU  Anesthesia Type:Spinal  Level of Consciousness: awake, alert  and oriented  Airway & Oxygen Therapy: Patient Spontanous Breathing and Patient connected to nasal cannula oxygen  Post-op Assessment: Report given to PACU RN and Post -op Vital signs reviewed and stable  Post vital signs: Reviewed and stable  Complications: No apparent anesthesia complications

## 2014-07-11 LAB — BASIC METABOLIC PANEL
Anion gap: 10 (ref 5–15)
BUN: 14 mg/dL (ref 6–23)
CO2: 26 mEq/L (ref 19–32)
Calcium: 8.8 mg/dL (ref 8.4–10.5)
Chloride: 104 mEq/L (ref 96–112)
Creatinine, Ser: 0.86 mg/dL (ref 0.50–1.35)
GFR calc Af Amer: >90 mL/min (ref >90)
GFR calc non Af Amer: 87 mL/min — ABNORMAL LOW (ref >90)
Glucose, Bld: 145 mg/dL — ABNORMAL HIGH (ref 70–99)
Potassium: 4.1 mEq/L (ref 3.7–5.3)
Sodium: 140 mEq/L (ref 137–147)

## 2014-07-11 LAB — CBC
HEMATOCRIT: 31.6 % — AB (ref 39.0–52.0)
HEMOGLOBIN: 10.6 g/dL — AB (ref 13.0–17.0)
MCH: 32 pg (ref 26.0–34.0)
MCHC: 33.5 g/dL (ref 30.0–36.0)
MCV: 95.5 fL (ref 78.0–100.0)
Platelets: 144 10*3/uL — ABNORMAL LOW (ref 150–400)
RBC: 3.31 MIL/uL — AB (ref 4.22–5.81)
RDW: 13 % (ref 11.5–15.5)
WBC: 9.8 10*3/uL (ref 4.0–10.5)

## 2014-07-11 NOTE — Progress Notes (Signed)
Physical Therapy Treatment Patient Details Name: Peter Morgan MRN: [id number] DOB: 01-01-1946 Today's Date: 07/11/2014    History of Present Illness pt admitted for RTKA . Had recent [place] exactly one year ago and p/o complication with bradycardia, and hypotension.     PT Comments    POD # 1 pm session.  Assisted out of recliner to amb in hallway a greater distance and no c/o dizziness.  Assisted back to bed for CPM.    Follow Up Recommendations  Home health PT     Equipment Recommendations  None recommended by PT    Recommendations for Other Services       Precautions / Restrictions Precautions Precautions: Knee Restrictions Weight Bearing Restrictions: No Other Position/Activity Restrictions: WBAT    Mobility  Bed Mobility Overal bed mobility: Needs Assistance Bed Mobility: Sit to Supine     Supine to sit: Min guard     General bed mobility comments: assisted back to bed MinGuard assist for R LE  Transfers Overall transfer level: Needs assistance Equipment used: Rolling walker (2 wheeled) Transfers: Sit to/from Stand Sit to Stand: Supervision;Min guard         General transfer comment: <25% VC's on safety with turns  Ambulation/Gait Ambulation/Gait assistance: Min guard;Min assist Ambulation Distance (Feet): 85 Feet Assistive device: Rolling walker (2 wheeled) Gait Pattern/deviations: Step-to pattern Gait velocity: decreased   General Gait Details: R foot drop pt aware.  chair following behind for safety.   Stairs            Wheelchair Mobility    Modified Rankin (Stroke Patients Only)       Balance                                    Cognition Arousal/Alertness: Awake/alert Behavior During Therapy: WFL for tasks assessed/performed Overall Cognitive Status: Within Functional Limits for tasks assessed                      Exercises      General Comments        Pertinent Vitals/Pain C/o 6/10 ICE  applied    Home Living Family/patient expects to be discharged to:: Private residence Living Arrangements: Spouse/significant other Available Help at Discharge: Family Type of Home: House Home Access: Stairs to enter Entrance Stairs-Rails: None Home Layout: Two level;Able to live on main level with bedroom/bathroom Home Equipment: Gilford Rile - 2 wheels;Bedside commode      Prior Function Level of Independence: Independent          PT Goals (current goals can now be found in the care plan section) Progress towards PT goals: Progressing toward goals    Frequency  7X/week    PT Plan      Co-evaluation             End of Session Equipment Utilized During Treatment: Gait belt Activity Tolerance: Patient tolerated treatment well Patient left: in bed;with call bell/phone within reach     Time: 1335-1400 PT Time Calculation (min): 25 min  Charges:  $Gait Training: 8-22 mins $Therapeutic Activity: 8-22 mins                    G Codes:      Rica Koyanagi  PTA [facility]  Acute  Rehab Pager      [phone]

## 2014-07-11 NOTE — Discharge Instructions (Addendum)
° °Dr. Frank Aluisio °Total Joint Specialist °[facility] °[address] °[place], [place] [address] °[phone] ° °TOTAL KNEE REPLACEMENT POSTOPERATIVE DIRECTIONS ° ° ° °Knee Rehabilitation, Guidelines Following Surgery  °Results after knee surgery are often greatly improved when you follow the exercise, range of motion and muscle strengthening exercises prescribed by your doctor. Safety measures are also important to protect the knee from further injury. Any time any of these exercises cause you to have increased pain or swelling in your knee joint, decrease the amount until you are comfortable again and slowly increase them. If you have problems or questions, call your caregiver or physical therapist for advice.  ° °HOME CARE INSTRUCTIONS  °Remove items at home which could result in a fall. This includes throw rugs or furniture in walking pathways.  °Continue medications as instructed at time of discharge. °You may have some home medications which will be placed on hold until you complete the course of blood thinner medication.  °You may start showering once you are discharged home but do not submerge the incision under water. Just pat the incision dry and apply a dry gauze dressing on daily. °Walk with walker as instructed.  °You may resume a sexual relationship in one month or when given the OK by  your doctor.  °· Use walker as long as suggested by your caregivers. °· Avoid periods of inactivity such as sitting longer than an hour when not asleep. This helps prevent blood clots.  °You may put full weight on your legs and walk as much as is comfortable.  °You may return to work once you are cleared by your doctor.  °Do not drive a car for 6 weeks or until released by you surgeon.  °· Do not drive while taking narcotics.  °Wear the elastic stockings for three weeks following surgery during the day but you may remove then at night. °Make sure you keep all of your appointments after your  operation with all of your doctors and caregivers. You should call the office at the above phone number and make an appointment for approximately two weeks after the date of your surgery. °Change the dressing daily and reapply a dry dressing each time. °Please pick up a stool softener and laxative for home use as long as you are requiring pain medications. °· Continue to use ice on the knee for pain and swelling from surgery. You may notice swelling that will progress down to the foot and ankle.  This is normal after surgery.  Elevate the leg when you are not up walking on it.   °It is important for you to complete the blood thinner medication as prescribed by your doctor. °· Continue to use the breathing machine which will help keep your temperature down.  It is common for your temperature to cycle up and down following surgery, especially at night when you are not up moving around and exerting yourself.  The breathing machine keeps your lungs expanded and your temperature down. ° °RANGE OF MOTION AND STRENGTHENING EXERCISES  °Rehabilitation of the knee is important following a knee injury or an operation. After just a few days of immobilization, the muscles of the thigh which control the knee become weakened and shrink (atrophy). Knee exercises are designed to build up the tone and strength of the thigh muscles and to improve knee motion. Often times heat used for twenty to thirty minutes before working out will loosen up your tissues and help with improving the   range of motion but do not use heat for the first two weeks following surgery. These exercises can be done on a training (exercise) mat, on the floor, on a table or on a bed. Use what ever works the best and is most comfortable for you Knee exercises include:  Leg Lifts - While your knee is still immobilized in a splint or cast, you can do straight leg raises. Lift the leg to 60 degrees, hold for 3 sec, and slowly lower the leg. Repeat 10-20 times 2-3  times daily. Perform this exercise against resistance later as your knee gets better.  Quad and Hamstring Sets - Tighten up the muscle on the front of the thigh (Quad) and hold for 5-10 sec. Repeat this 10-20 times hourly. Hamstring sets are done by pushing the foot backward against an object and holding for 5-10 sec. Repeat as with quad sets.  A rehabilitation program following serious knee injuries can speed recovery and prevent re-injury in the future due to weakened muscles. Contact your doctor or a physical therapist for more information on knee rehabilitation.   SKILLED REHAB INSTRUCTIONS: If the patient is transferred to a skilled rehab facility following release from the hospital, a list of the current medications will be sent to the facility for the patient to continue.  When discharged from the skilled rehab facility, please have the facility set up the patient's [place] prior to being released. Also, the skilled facility will be responsible for providing the patient with their medications at time of release from the facility to include their pain medication, the muscle relaxants, and their blood thinner medication. If the patient is still at the rehab facility at time of the two week follow up appointment, the skilled rehab facility will also need to assist the patient in arranging follow up appointment in our office and any transportation needs.  MAKE SURE YOU:  Understand these instructions.  Will watch your condition.  Will get help right away if you are not doing well or get worse.    Pick up stool softner and laxative for home. Do not submerge incision under water. May shower. Continue to use ice for pain and swelling from surgery.   Take Xarelto for two and a half more weeks, then discontinue Xarelto. Once the patient has completed the Xarelto, they may resume the 81 mg Aspirin and the Plavix 75 mg daily.   Information on my medicine - XARELTO  (Rivaroxaban)  This medication education was reviewed with me or my healthcare representative as part of my discharge preparation.  The pharmacist that spoke with me during my hospital stay was:  Angela Adam [facility]  Why was Xarelto prescribed for you? Xarelto was prescribed for you to reduce the risk of blood clots forming after orthopedic surgery. The medical term for these abnormal blood clots is venous thromboembolism (VTE).  What do you need to know about xarelto ? Take your Xarelto ONCE DAILY at the same time every day. You may take it either with or without food.  If you have difficulty swallowing the tablet whole, you may crush it and mix in applesauce just prior to taking your dose.  Take Xarelto exactly as prescribed by your doctor and DO NOT stop taking Xarelto without talking to the doctor who prescribed the medication.  Stopping without other VTE prevention medication to take the place of Xarelto may increase your risk of developing a clot.  After discharge, you should have regular check-up  appointments with your healthcare provider that is prescribing your Xarelto.    What do you do if you miss a dose? If you miss a dose, take it as soon as you remember on the same day then continue your regularly scheduled once daily regimen the next day. Do not take two doses of Xarelto on the same day.   Important Safety Information A possible side effect of Xarelto is bleeding. You should call your healthcare provider right away if you experience any of the following:   Bleeding from an injury or your nose that does not stop.   Unusual colored urine (red or dark brown) or unusual colored stools (red or black).   Unusual bruising for unknown reasons.   A serious fall or if you hit your head (even if there is no bleeding).  Some medicines may interact with Xarelto and might increase your risk of bleeding while on Xarelto. To help avoid this, consult your healthcare provider  or pharmacist prior to using any new prescription or non-prescription medications, including herbals, vitamins, non-steroidal anti-inflammatory drugs (NSAIDs) and supplements.  This website has more information on Xarelto: [email or web address]

## 2014-07-11 NOTE — Progress Notes (Signed)
   Subjective: 1 Day Post-Op Procedure(s) (LRB): RIGHT TOTAL KNEE ARTHROPLASTY (Right) Patient reports pain as moderate.   Patient seen in rounds with Dr. Wynelle Link.  Rough night last night but doing a little better this morning.  Had some nausea last night. Patient is well, but has had some minor complaints of pain in the [facility], requiring pain medications We will start therapy today. Tried to get up with therapy yesterday and had a drop in pressure. Plan is to go Home after hospital stay.  Objective: Vital signs in last 24 hours: Temp:  [97.5 F (36.4 C)-98 F (36.7 C)] 97.7 F (36.5 C) (07/28 0612) Pulse Rate:  [58-81] 73 (07/28 0612) Resp:  [9-20] 17 (07/28 0612) BP: (79-127)/(35-73) 101/72 mmHg (07/28 0612) SpO2:  [95 %-100 %] 100 % (07/28 0612) Weight:  [76.204 kg (168 lb)] 76.204 kg (168 lb) (07/27 0754)  Intake/Output from previous day:  Intake/Output Summary (Last 24 hours) at 07/11/14 0726 Last data filed at 07/11/14 0624  Gross per 24 hour  Intake 4588.33 ml  Output   2600 ml  Net 1988.33 ml    Intake/Output this shift:    Labs:  Recent Labs  07/11/14 0416  HGB 10.6*    Recent Labs  07/11/14 0416  WBC 9.8  RBC 3.31*  HCT 31.6*  PLT 144*    Recent Labs  07/11/14 0416  NA 140  K 4.1  CL 104  CO2 26  BUN 14  CREATININE 0.86  GLUCOSE 145*  CALCIUM 8.8   No results found for this basename: LABPT, INR,  in the last 72 hours  EXAM General - Patient is Alert, Appropriate and Oriented Extremity - Neurovascular intact Sensation intact distally Dorsiflexion/Plantar flexion intact Dressing - dressing C/D/I Motor Function - intact, moving foot and toes well on exam.  Hemovac pulled without difficulty.  Past Medical History  Diagnosis Date  . Hyperlipidemia   . BPH (benign prostatic hyperplasia)   . Arthritis   . CAD (coronary artery disease)     Diagnosed 12/2011 following abnormal treadmill test - high grade OM stenosis s/p DES 01/05/12, mild  residual non-obstructive LAD, Lcx, RCA disease.  Normal LV function with EF 55-65% by cath 01/05/12  . Hypertension   . GERD (gastroesophageal reflux disease)   . PONV (postoperative nausea and vomiting)     Assessment/Plan: 1 Day Post-Op Procedure(s) (LRB): RIGHT TOTAL KNEE ARTHROPLASTY (Right) Active Problems:   OA (osteoarthritis) of knee  Estimated body mass index is 24.8 kg/(m^2) as calculated from the following:   Height as of this encounter: 5\' 9"  (1.753 m).   Weight as of this encounter: 76.204 kg (168 lb). Advance diet Up with therapy Plan for discharge tomorrow Discharge home with home health  DVT Prophylaxis - Xarelto Weight-Bearing as tolerated to right leg D/C O2 and Pulse OX and try on Room Air  Arlee Muslim, PA-C Orthopaedic Surgery 07/11/2014, 7:26 AM

## 2014-07-11 NOTE — Progress Notes (Signed)
Physical Therapy Treatment Patient Details Name: JAHSON EMANUELE MRN: [id number] DOB: 03/07/1946 Today's Date: 07/11/2014    History of Present Illness pt admitted for RTKA . Had recent [place] exactly one year ago and p/o complication with bradycardia, and hypotension.     PT Comments    POD # 1 am session.  Assisted pt OOB with increased time to monitor BP.  Pt c/o mild dizziness but "not as bad as yesterday".  Amb in hallway twice with one sitting rest break and recliner following behind for safety.  Perfomed TKR TE's following handout and performed R heelcore stretching.     Follow Up Recommendations  Home health PT     Equipment Recommendations  None recommended by PT    Recommendations for Other Services       Precautions / Restrictions Precautions Precautions: Knee Restrictions Weight Bearing Restrictions: No Other Position/Activity Restrictions: WBAT    Mobility  Bed Mobility Overal bed mobility: Needs Assistance Bed Mobility: Supine to Sit     Supine to sit: Min guard     General bed mobility comments: assisted LE , pt was Independent with Upper body  Transfers Overall transfer level: Needs assistance Equipment used: Rolling walker (2 wheeled) Transfers: Sit to/from Stand Sit to Stand: Min guard         General transfer comment: increased time to allow rest breaks between position changes  Ambulation/Gait Ambulation/Gait assistance: Min assist Ambulation Distance (Feet): 44 Feet (22 feet x 2 one sitting rest break) Assistive device: Rolling walker (2 wheeled) Gait Pattern/deviations: Step-to pattern Gait velocity: decreased   General Gait Details: R foot drop pt aware.  chair following behind for safety.   Stairs            Wheelchair Mobility    Modified Rankin (Stroke Patients Only)       Balance                                    Cognition                            Exercises      General Comments         Pertinent Vitals/Pain C/o 7/10 during gait 'hurts more with walking than exercises" Pre medicated ICE applied    Home Living                      Prior Function            PT Goals (current goals can now be found in the care plan section) Progress towards PT goals: Progressing toward goals    Frequency  7X/week    PT Plan      Co-evaluation             End of Session Equipment Utilized During Treatment: Gait belt Activity Tolerance: Patient limited by fatigue Patient left: in chair;with call bell/phone within reach     Time: 1121-1154 PT Time Calculation (min): 33 min  Charges:  $Gait Training: 8-22 mins $Therapeutic Exercise: 8-22 mins                    G Codes:      Rica Koyanagi  PTA [facility]  Acute  Rehab Pager      [phone]

## 2014-07-11 NOTE — Evaluation (Signed)
Occupational Therapy Evaluation Patient Details Name: Peter Morgan MRN: [id number] DOB: [birth year] Today's Date: 07/11/2014    History of Present Illness pt admitted for RTKA . Had recent [place] exactly one year ago and p/o complication with bradycardia, and hypotension.    Clinical Impression   This 68 year old man was admitted for the above surgery.  Reviewed education and pt verbalizes understanding.  No further OT is needed at this time.    Follow Up Recommendations  No OT follow up    Equipment Recommendations  None recommended by OT (has 3:1)    Recommendations for Other Services       Precautions / Restrictions Precautions Precautions: Knee Restrictions Weight Bearing Restrictions: No Other Position/Activity Restrictions: WBAT      Mobility Bed Mobility              Transfers Overall transfer level: Needs assistance Equipment used: Rolling walker (2 wheeled) Transfers: Sit to/from Stand Sit to Stand: Min guard         General transfer comment: no c/o dizziness; min guard for safety    Balance                                            ADL Overall ADL's : Needs assistance/impaired     Grooming: Wash/dry hands;Supervision/safety;Standing   Upper Body Bathing: Set up;Sitting   Lower Body Bathing: Minimal assistance;Sit to/from stand   Upper Body Dressing : Set up;Sitting   Lower Body Dressing: Minimal assistance;Sit to/from stand   Toilet Transfer: Min guard;Ambulation;Regular Toilet (stood to urinate)   Toileting- Water quality scientist and Hygiene: Min guard;Sit to/from stand   Tub/ Shower Transfer: Walk-in shower;Min guard;3 in 1;Cueing for sequencing;Ambulation     General ADL Comments: ambulated to bathroom and stood for urination/washing hands.  Practiced shower transfer.  Min guard for safety.  Min cues for walker safety in bathroom     Vision                     Perception     Praxis       Pertinent Vitals/Pain 3/10 R knee; repositioned and ice reapplied     Hand Dominance     Extremity/Trunk Assessment Upper Extremity Assessment Upper Extremity Assessment: Overall WFL for tasks assessed           Communication Communication Communication: No difficulties   Cognition Arousal/Alertness: Awake/alert Behavior During Therapy: WFL for tasks assessed/performed Overall Cognitive Status: Within Functional Limits for tasks assessed                     General Comments       Exercises       Shoulder Instructions      Home Living Family/patient expects to be discharged to:: Private residence Living Arrangements: Spouse/significant other Available Help at Discharge: Family Type of Home: House Home Access: Stairs to enter Technical brewer of Steps: 3 Entrance Stairs-Rails: None Home Layout: Two level;Able to live on main level with bedroom/bathroom     Bathroom Shower/Tub: Occupational psychologist: Standard     Home Equipment: Environmental consultant - 2 wheels;Bedside commode          Prior Functioning/Environment Level of Independence: Independent             OT Diagnosis:     OT Problem List:  OT Treatment/Interventions:      OT Goals(Current goals can be found in the care plan section)    OT Frequency:     Barriers to D/C:            Co-evaluation              End of Session    Activity Tolerance: Patient tolerated treatment well Patient left: in chair;with call bell/phone within reach   Time: 1304-1320 OT Time Calculation (min): 16 min Charges:  OT General Charges $OT Visit: 1 Procedure OT Evaluation $Initial OT Evaluation Tier I: 1 Procedure OT Treatments $Self Care/Home Management : 8-22 mins G-Codes:    Amanie Mcculley 08/04/2014, 2:08 PM  Lesle Chris, OTR/L [phone] 08-04-14

## 2014-07-12 LAB — BASIC METABOLIC PANEL
ANION GAP: 7 (ref 5–15)
BUN: 11 mg/dL (ref 6–23)
CHLORIDE: 107 meq/L (ref 96–112)
CO2: 27 meq/L (ref 19–32)
Calcium: 8.8 mg/dL (ref 8.4–10.5)
Creatinine, Ser: 0.72 mg/dL (ref 0.50–1.35)
GFR calc Af Amer: >90 mL/min (ref >90)
GFR calc non Af Amer: >90 mL/min (ref >90)
Glucose, Bld: 135 mg/dL — ABNORMAL HIGH (ref 70–99)
Potassium: 3.9 mEq/L (ref 3.7–5.3)
Sodium: 141 mEq/L (ref 137–147)

## 2014-07-12 LAB — CBC
HEMATOCRIT: 29.5 % — AB (ref 39.0–52.0)
HEMOGLOBIN: 9.5 g/dL — AB (ref 13.0–17.0)
MCH: 31.5 pg (ref 26.0–34.0)
MCHC: 32.2 g/dL (ref 30.0–36.0)
MCV: 97.7 fL (ref 78.0–100.0)
Platelets: 136 10*3/uL — ABNORMAL LOW (ref 150–400)
RBC: 3.02 MIL/uL — AB (ref 4.22–5.81)
RDW: 13.5 % (ref 11.5–15.5)
WBC: 9.7 10*3/uL (ref 4.0–10.5)

## 2014-07-12 MED ORDER — RIVAROXABAN 10 MG PO TABS: 10.0000 mg | ORAL_TABLET | Freq: Every day | ORAL | Status: DC

## 2014-07-12 MED ORDER — METHOCARBAMOL 500 MG PO TABS: 500.0000 mg | ORAL_TABLET | Freq: Four times a day (QID) | ORAL | Status: DC | PRN

## 2014-07-12 MED ORDER — HYDROMORPHONE HCL 2 MG PO TABS: 2.0000 mg | ORAL_TABLET | ORAL | Status: DC | PRN

## 2014-07-12 NOTE — Progress Notes (Signed)
Physical Therapy Treatment Patient Details Name: Peter Morgan MRN: [id number] DOB: 16-Oct-1946 Today's Date: 07/12/2014    History of Present Illness pt admitted for RTKA . Had recent [place] exactly one year ago and p/o complication with bradycardia, and hypotension.     PT Comments    POD # 2 am session.  Pt c/o increased pain and "soreness" today vs yesterday.  Assisted OOB with increased time as pt was c/o mild dizziness.  Sat EOB several min before amb in hallway.  Assisted back to bed.  Pt in too much pain tp perform TE's but did agree to get into CPM. So applied CPM to R LE at 10 - 50 degrees at 10:10 am.  Will return at later time to perform TE's and stair training.   Follow Up Recommendations  Home health PT     Equipment Recommendations       Recommendations for Other Services       Precautions / Restrictions Precautions Precautions: Knee Restrictions Weight Bearing Restrictions: No Other Position/Activity Restrictions: WBAT    Mobility  Bed Mobility Overal bed mobility: Needs Assistance Bed Mobility: Sit to Supine;Supine to Sit     Supine to sit: Min assist Sit to supine: Min assist   General bed mobility comments: assisted OOB and back to bed per pt request  Transfers Overall transfer level: Needs assistance Equipment used: Rolling walker (2 wheeled) Transfers: Sit to/from Stand Sit to Stand: Supervision;Min guard         General transfer comment: <25% VC's on safety with turns plus increased time  Ambulation/Gait Ambulation/Gait assistance: Min guard Ambulation Distance (Feet): 94 Feet Assistive device: Rolling walker (2 wheeled) Gait Pattern/deviations: Step-to pattern Gait velocity: decreased   General Gait Details: R foot drop pt aware.  Required increased time   Stairs            Wheelchair Mobility    Modified Rankin (Stroke Patients Only)       Balance                                    Cognition                             Exercises      General Comments        Pertinent Vitals/Pain C/o 7/10 pain    Home Living                      Prior Function            PT Goals (current goals can now be found in the care plan section) Progress towards PT goals: Progressing toward goals    Frequency  7X/week    PT Plan      Co-evaluation             End of Session Equipment Utilized During Treatment: Gait belt Activity Tolerance: Patient tolerated treatment well Patient left: in bed;with call bell/phone within reach;in CPM     Time: 7829-5621 PT Time Calculation (min): 27 min  Charges:  $Gait Training: 8-22 mins $Therapeutic Activity: 8-22 mins                    G Codes:      Rica Koyanagi  PTA [facility]  Acute  Rehab Pager      [phone]

## 2014-07-12 NOTE — Discharge Summary (Signed)
Physician Discharge Summary   Patient ID: Peter Morgan MRN: [id number] DOB/AGE: 1946-09-25 68 y.o.  Admit date: 07/10/2014 Discharge date: 07/12/2014  Primary Diagnosis: Osteoarthritis Right knee(s)   Admission Diagnoses:  Past Medical History  Diagnosis Date  . Hyperlipidemia   . BPH (benign prostatic hyperplasia)   . Arthritis   . CAD (coronary artery disease)     Diagnosed 12/2011 following abnormal treadmill test - high grade OM stenosis s/p DES 01/05/12, mild residual non-obstructive LAD, Lcx, RCA disease.  Normal LV function with EF 55-65% by cath 01/05/12  . Hypertension   . GERD (gastroesophageal reflux disease)   . PONV (postoperative nausea and vomiting)    Discharge Diagnoses:   Active Problems:   OA (osteoarthritis) of knee  Estimated body mass index is 24.8 kg/(m^2) as calculated from the following:   Height as of this encounter: _0  (1.753 m).   Weight as of this encounter: 76.204 kg (168 lb).  Procedure:  Procedure(s) (LRB): RIGHT TOTAL KNEE ARTHROPLASTY (Right)   Consults: None  HPI: Peter Morgan is a 68 y.o. year old male with end stage OA of his right knee with progressively worsening pain and dysfunction. He has constant pain, with activity and at rest and significant functional deficits with difficulties even with ADLs. He has had extensive non-op management including analgesics, injections of cortisone and viscosupplements, and home exercise program, but remains in significant pain with significant dysfunction. Radiographs show bone on bone arthritis medial and patellofemoral. He presents now for right Total Knee Arthroplasty.   Laboratory Data: Admission on 07/10/2014, Discharged on 07/12/2014  Component Date Value Ref Range Status  . ABO/RH(D) 07/10/2014 A POS   Final  . Antibody Screen 07/10/2014 NEG   Final  . Sample Expiration 07/10/2014 07/13/2014   Final  . WBC 07/11/2014 9.8  4.0 - 10.5 K/uL Final  . RBC 07/11/2014 3.31* 4.22 - 5.81  MIL/uL Final  . Hemoglobin 07/11/2014 10.6* 13.0 - 17.0 g/dL Final  . HCT 07/11/2014 31.6* 39.0 - 52.0 % Final  . MCV 07/11/2014 95.5  78.0 - 100.0 fL Final  . MCH 07/11/2014 32.0  26.0 - 34.0 pg Final  . MCHC 07/11/2014 33.5  30.0 - 36.0 g/dL Final  . RDW 07/11/2014 13.0  11.5 - 15.5 % Final  . Platelets 07/11/2014 144* 150 - 400 K/uL Final  . Sodium 07/11/2014 140  137 - 147 mEq/L Final  . Potassium 07/11/2014 4.1  3.7 - 5.3 mEq/L Final  . Chloride 07/11/2014 104  96 - 112 mEq/L Final  . CO2 07/11/2014 26  19 - 32 mEq/L Final  . Glucose, Bld 07/11/2014 145* 70 - 99 mg/dL Final  . BUN 07/11/2014 14  6 - 23 mg/dL Final  . Creatinine, Ser 07/11/2014 0.86  0.50 - 1.35 mg/dL Final  . Calcium 07/11/2014 8.8  8.4 - 10.5 mg/dL Final  . GFR calc non Af Amer 07/11/2014 87* >90 mL/min Final  . GFR calc Af Amer 07/11/2014 >90  >90 mL/min Final   Comment: (NOTE)                          The eGFR has been calculated using the CKD EPI equation.                          This calculation has not been validated in all clinical situations.  eGFR's persistently <90 mL/min signify possible Chronic Kidney                          Disease.  . Anion gap 07/11/2014 10  5 - 15 Final  . WBC 07/12/2014 9.7  4.0 - 10.5 K/uL Final  . RBC 07/12/2014 3.02* 4.22 - 5.81 MIL/uL Final  . Hemoglobin 07/12/2014 9.5* 13.0 - 17.0 g/dL Final  . HCT 07/12/2014 29.5* 39.0 - 52.0 % Final  . MCV 07/12/2014 97.7  78.0 - 100.0 fL Final  . MCH 07/12/2014 31.5  26.0 - 34.0 pg Final  . MCHC 07/12/2014 32.2  30.0 - 36.0 g/dL Final  . RDW 07/12/2014 13.5  11.5 - 15.5 % Final  . Platelets 07/12/2014 136* 150 - 400 K/uL Final  . Sodium 07/12/2014 141  137 - 147 mEq/L Final  . Potassium 07/12/2014 3.9  3.7 - 5.3 mEq/L Final  . Chloride 07/12/2014 107  96 - 112 mEq/L Final  . CO2 07/12/2014 27  19 - 32 mEq/L Final  . Glucose, Bld 07/12/2014 135* 70 - 99 mg/dL Final  . BUN 07/12/2014 11  6 - 23 mg/dL Final    . Creatinine, Ser 07/12/2014 0.72  0.50 - 1.35 mg/dL Final  . Calcium 07/12/2014 8.8  8.4 - 10.5 mg/dL Final  . GFR calc non Af Amer 07/12/2014 >90  >90 mL/min Final  . GFR calc Af Amer 07/12/2014 >90  >90 mL/min Final   Comment: (NOTE)                          The eGFR has been calculated using the CKD EPI equation.                          This calculation has not been validated in all clinical situations.                          eGFR's persistently <90 mL/min signify possible Chronic Kidney                          Disease.  Georgiann Hahn gap 07/12/2014 7  5 - 15 Final  Hospital Outpatient Visit on 07/03/2014  Component Date Value Ref Range Status  . WBC 07/03/2014 5.9  4.0 - 10.5 K/uL Final  . RBC 07/03/2014 4.00* 4.22 - 5.81 MIL/uL Final  . Hemoglobin 07/03/2014 12.6* 13.0 - 17.0 g/dL Final  . HCT 07/03/2014 38.0* 39.0 - 52.0 % Final  . MCV 07/03/2014 95.0  78.0 - 100.0 fL Final  . MCH 07/03/2014 31.5  26.0 - 34.0 pg Final  . MCHC 07/03/2014 33.2  30.0 - 36.0 g/dL Final  . RDW 07/03/2014 13.1  11.5 - 15.5 % Final  . Platelets 07/03/2014 145* 150 - 400 K/uL Final  . Prothrombin Time 07/03/2014 13.1  11.6 - 15.2 seconds Final  . INR 07/03/2014 0.99  0.00 - 1.49 Final  . aPTT 07/03/2014 30  24 - 37 seconds Final  . Color, Urine 07/03/2014 YELLOW  YELLOW Final  . APPearance 07/03/2014 CLEAR  CLEAR Final  . Specific Gravity, Urine 07/03/2014 1.019  1.005 - 1.030 Final  . pH 07/03/2014 7.5  5.0 - 8.0 Final  . Glucose, UA 07/03/2014 NEGATIVE  NEGATIVE mg/dL Final  . Hgb urine dipstick 07/03/2014 NEGATIVE  NEGATIVE Final  .  Bilirubin Urine 07/03/2014 NEGATIVE  NEGATIVE Final  . Ketones, ur 07/03/2014 NEGATIVE  NEGATIVE mg/dL Final  . Protein, ur 07/03/2014 NEGATIVE  NEGATIVE mg/dL Final  . Urobilinogen, UA 07/03/2014 0.2  0.0 - 1.0 mg/dL Final  . Nitrite 07/03/2014 NEGATIVE  NEGATIVE Final  . Leukocytes, UA 07/03/2014 NEGATIVE  NEGATIVE Final   MICROSCOPIC NOT DONE ON URINES WITH  NEGATIVE PROTEIN, BLOOD, LEUKOCYTES, NITRITE, OR GLUCOSE <1000 mg/dL.  Marland Kitchen MRSA, PCR 07/03/2014 NEGATIVE  NEGATIVE Final  . Staphylococcus aureus 07/03/2014 NEGATIVE  NEGATIVE Final   Comment:                                 The Xpert SA Assay (FDA                          approved for NASAL specimens                          in patients over 72 years of age),                          is one component of                          a comprehensive surveillance                          program.  Test performance has                          been validated by [employer] for patients greater                          than or equal to 42 year old.                          It is not intended                          to diagnose infection nor to                          guide or monitor treatment.  . Sodium 07/03/2014 140  137 - 147 mEq/L Final  . Potassium 07/03/2014 4.4  3.7 - 5.3 mEq/L Final  . Chloride 07/03/2014 104  96 - 112 mEq/L Final  . CO2 07/03/2014 27  19 - 32 mEq/L Final  . Glucose, Bld 07/03/2014 102* 70 - 99 mg/dL Final  . BUN 07/03/2014 16  6 - 23 mg/dL Final  . Creatinine, Ser 07/03/2014 0.87  0.50 - 1.35 mg/dL Final  . Calcium 07/03/2014 9.9  8.4 - 10.5 mg/dL Final  . Total Protein 07/03/2014 7.0  6.0 - 8.3 g/dL Final  . Albumin 07/03/2014 3.8  3.5 - 5.2 g/dL Final  . AST 07/03/2014 19  0 - 37 U/L Final  . ALT 07/03/2014 17  0 - 53 U/L Final  . Alkaline Phosphatase 07/03/2014 60  39 -  117 U/L Final  . Total Bilirubin 07/03/2014 0.4  0.3 - 1.2 mg/dL Final  . GFR calc non Af Amer 07/03/2014 87* >90 mL/min Final  . GFR calc Af Amer 07/03/2014 >90  >90 mL/min Final   Comment: (NOTE)                          The eGFR has been calculated using the CKD EPI equation.                          This calculation has not been validated in all clinical situations.                          eGFR's persistently <90 mL/min signify possible Chronic Kidney                           Disease.  . Anion gap 07/03/2014 9  5 - 15 Final     X-Rays:Dg Chest 2 View  07/03/2014   CLINICAL DATA:  Hypertension, preop  EXAM: CHEST  2 VIEW  COMPARISON:  07/06/2013  FINDINGS: Cardiomediastinal silhouette is stable. No acute infiltrate or pleural effusion. No pulmonary edema. Degenerative changes thoracic spine.  IMPRESSION: No active cardiopulmonary disease.  Degenerative changes thoracic spine.   Electronically Signed   By: Lahoma Crocker M.D.   On: 07/03/2014 13:32    EKG: Orders placed in visit on 02/27/14  . EKG 12-LEAD     Hospital Course: Peter Morgan is a 68 y.o. who was admitted to [facility]. They were brought to the operating room on 07/10/2014 and underwent Procedure(s): RIGHT TOTAL KNEE ARTHROPLASTY.  Patient tolerated the procedure well and was later transferred to the recovery room and then to the orthopaedic floor for postoperative care.  They were given PO and IV analgesics for pain control following their surgery.  They were given 24 hours of postoperative antibiotics of  Anti-infectives   Start     Dose/Rate Route Frequency Ordered Stop   07/10/14 1630  ceFAZolin (ANCEF) IVPB 2 g/50 mL premix     2 g 100 mL/hr over 30 Minutes Intravenous Every 6 hours 07/10/14 1324 07/10/14 2204   07/10/14 1015  ceFAZolin (ANCEF) IVPB 2 g/50 mL premix     2 g 100 mL/hr over 30 Minutes Intravenous  Once 07/10/14 1004 07/10/14 1032     and started on DVT prophylaxis in the form of Xarelto.   PT and OT were ordered for total joint protocol.  Discharge planning consulted to help with postop disposition and equipment needs.  Patient had a rough night on the evening of surgery with noted nausea.  The felt a little better the next day  They started to get up OOB with therapy on day one. Hemovac drain was pulled without difficulty.  Continued to work with therapy into day two.  Dressing was changed on day two and the incision was healing well.  Patient was seen in rounds  by Dr. Wynelle Link on day two and was ready to go home.   Diet: Cardiac diet Activity:WBAT Follow-up:in 2 weeks Disposition - Home Discharged Condition: good       Discharge Instructions   Call MD / Call 911    Complete by:  As directed   If you experience chest pain or shortness of breath, CALL 911 and  be transported to the hospital emergency room.  If you develope a fever above 101 F, pus (white drainage) or increased drainage or redness at the wound, or calf pain, call your surgeon's office.     Change dressing    Complete by:  As directed   Change dressing daily with sterile 4 x 4 inch gauze dressing and apply TED hose. Do not submerge the incision under water.     Constipation Prevention    Complete by:  As directed   Drink plenty of fluids.  Prune juice may be helpful.  You may use a stool softener, such as Colace (over the counter) 100 mg twice a day.  Use MiraLax (over the counter) for constipation as needed.     Diet - low sodium heart healthy    Complete by:  As directed      Discharge instructions    Complete by:  As directed   Pick up stool softner and laxative for home. Do not submerge incision under water. May shower. Continue to use ice for pain and swelling from surgery.  Take Xarelto for two and a half more weeks, then discontinue Xarelto. Once the patient has completed the Xarelto, they may resume the 81 mg Aspirin and the Plavix 75 mg daily.     Do not put a pillow under the knee. Place it under the heel.    Complete by:  As directed      Do not sit on low chairs, stoools or toilet seats, as it may be difficult to get up from low surfaces    Complete by:  As directed      Driving restrictions    Complete by:  As directed   No driving until released by the physician.     Increase activity slowly as tolerated    Complete by:  As directed      Lifting restrictions    Complete by:  As directed   No lifting until released by the physician.     Patient may shower     Complete by:  As directed   You may shower without a dressing once there is no drainage.  Do not wash over the wound.  If drainage remains, do not shower until drainage stops.     TED hose    Complete by:  As directed   Use stockings (TED hose) for 3 weeks on both leg(s).  You may remove them at night for sleeping.     Weight bearing as tolerated    Complete by:  As directed             Medication List    STOP taking these medications       aspirin EC 81 MG tablet     clopidogrel 75 MG tablet  Commonly known as:  PLAVIX      TAKE these medications       acetaminophen 500 MG tablet  Commonly known as:  TYLENOL  Take 1,000 mg by mouth every 4 (four) hours as needed for pain. Pain     ALPRAZolam 0.5 MG tablet  Commonly known as:  XANAX  Take 0.5 mg by mouth at bedtime.     atorvastatin 20 MG tablet  Commonly known as:  LIPITOR  Take 20 mg by mouth every morning.     docusate sodium 100 MG capsule  Commonly known as:  COLACE  Take 100 mg by mouth daily as needed for mild constipation.  HYDROmorphone 2 MG tablet  Commonly known as:  DILAUDID  Take 1-2 tablets (2-4 mg total) by mouth every 3 (three) hours as needed for moderate pain or severe pain.     methocarbamol 500 MG tablet  Commonly known as:  ROBAXIN  Take 1 tablet (500 mg total) by mouth every 6 (six) hours as needed for muscle spasms.     metoprolol succinate 50 MG 24 hr tablet  Commonly known as:  TOPROL-XL  Take 25 mg by mouth every morning. Take with or immediately following a meal.     nitroGLYCERIN 0.4 MG SL tablet  Commonly known as:  NITROSTAT  Place 0.4 mg under the tongue every 5 (five) minutes as needed for chest pain.     pantoprazole 40 MG tablet  Commonly known as:  PROTONIX  Take 40 mg by mouth daily.     rivaroxaban 10 MG Tabs tablet  Commonly known as:  XARELTO  - Take 1 tablet (10 mg total) by mouth daily with breakfast. Take Xarelto for two and a half more weeks, then  discontinue Xarelto.  - Once the patient has completed the Xarelto, they may resume the 81 mg Aspirin and the Plavix 75 mg daily.     tamsulosin 0.4 MG Caps capsule  Commonly known as:  FLOMAX  Take 0.4 mg by mouth 2 (two) times daily.       Follow-up Information   Follow up with Gearlean Alf, MD. Schedule an appointment as soon as possible for a visit on 07/25/2014. (Call [phone] tomorrow to make the appointment)    Specialty:  Orthopedic Surgery   Contact information:   [address] [place] [address] [phone]       Signed: Arlee Muslim, PA-C Orthopaedic Surgery 08/01/2014, 1:50 PM

## 2014-07-12 NOTE — Progress Notes (Signed)
   Subjective: 2 Days Post-Op Procedure(s) (LRB): RIGHT TOTAL KNEE ARTHROPLASTY (Right) Patient reports pain as mild.  Tolerating dilaudid well without nausea Plan is to go Home after hospital stay.  Objective: Vital signs in last 24 hours: Temp:  [97.5 F (36.4 C)-98.1 F (36.7 C)] 97.5 F (36.4 C) (07/29 0456) Pulse Rate:  [64-76] 64 (07/29 0456) Resp:  [16-18] 16 (07/29 0456) BP: (107-145)/(59-79) 145/79 mmHg (07/29 0456) SpO2:  [97 %-100 %] 97 % (07/29 0456)  Intake/Output from previous day:  Intake/Output Summary (Last 24 hours) at 07/12/14 0715 Last data filed at 07/12/14 0617  Gross per 24 hour  Intake   2764 ml  Output   1950 ml  Net    814 ml    Intake/Output this shift:    Labs:  Recent Labs  07/11/14 0416 07/12/14 0420  HGB 10.6* 9.5*    Recent Labs  07/11/14 0416 07/12/14 0420  WBC 9.8 9.7  RBC 3.31* 3.02*  HCT 31.6* 29.5*  PLT 144* 136*    Recent Labs  07/11/14 0416 07/12/14 0420  NA 140 141  K 4.1 3.9  CL 104 107  CO2 26 27  BUN 14 11  CREATININE 0.86 0.72  GLUCOSE 145* 135*  CALCIUM 8.8 8.8   No results found for this basename: LABPT, INR,  in the last 72 hours  EXAM General - Patient is Alert, Appropriate and Oriented Extremity - Neurologically intact Neurovascular intact Incision: dressing C/D/I No cellulitis present Compartment soft Dressing/Incision - clean, dry, no drainage Motor Function - intact, moving foot and toes well on exam.   Past Medical History  Diagnosis Date  . Hyperlipidemia   . BPH (benign prostatic hyperplasia)   . Arthritis   . CAD (coronary artery disease)     Diagnosed 12/2011 following abnormal treadmill test - high grade OM stenosis s/p DES 01/05/12, mild residual non-obstructive LAD, Lcx, RCA disease.  Normal LV function with EF 55-65% by cath 01/05/12  . Hypertension   . GERD (gastroesophageal reflux disease)   . PONV (postoperative nausea and vomiting)     Assessment/Plan: 2 Days Post-Op  Procedure(s) (LRB): RIGHT TOTAL KNEE ARTHROPLASTY (Right) Active Problems:   OA (osteoarthritis) of knee   Discharge home with home health after 2 sessions of PT  DVT Prophylaxis - Xarelto Weight-Bearing as tolerated to right leg  Fe Okubo V 07/12/2014, 7:15 AM

## 2014-07-12 NOTE — Progress Notes (Signed)
Physical Therapy Treatment Patient Details Name: Peter Morgan MRN: [id number] DOB: 05-02-1946 Today's Date: 07/12/2014    History of Present Illness pt admitted for RTKA . Had recent [place] exactly one year ago and p/o complication with bradycardia, and hypotension.     PT Comments    POD # 2 pm session.  Amb in hallway with increased time then practiced stairs 3 times per pt request to see which way would be best for him.  Returned to room then performed TKR TE's following HEP handout.  Instructed on freq and proper tech as well as use of ICE after.  Pt required several rest breaks due to increased pain level.    Follow Up Recommendations  Home health PT     Equipment Recommendations       Recommendations for Other Services       Precautions / Restrictions Precautions Precautions: Knee Restrictions Weight Bearing Restrictions: No Other Position/Activity Restrictions: WBAT    Mobility  Bed Mobility Overal bed mobility: Needs Assistance Bed Mobility: Supine to Sit     Supine to sit: Min guard Sit to supine: Min assist   General bed mobility comments: increased time  Transfers Overall transfer level: Needs assistance Equipment used: Rolling walker (2 wheeled) Transfers: Sit to/from Stand Sit to Stand: Supervision         General transfer comment: increased time  Ambulation/Gait Ambulation/Gait assistance: Supervision;Min guard Ambulation Distance (Feet): 115 Feet Assistive device: Rolling walker (2 wheeled) Gait Pattern/deviations: Step-to pattern Gait velocity: decreased   General Gait Details: R foot drop pt aware.  Required increased time.  Several standing rest breaks to stretch L calf/heel core   Stairs Stairs: Yes Stairs assistance: Min assist Stair Management: No rails;Backwards;Forwards;With walker Number of Stairs: 4 General stair comments: performed 3 times per pt request to see which approached worked best for him.  Only required < 25% VC's  os safety and proper walker placement.    Wheelchair Mobility    Modified Rankin (Stroke Patients Only)       Balance                                    Cognition                            Exercises   Total Knee Replacement TE's 10 reps B LE ankle pumps 10 reps towel squeezes 10 reps knee presses 10 reps heel slides  10 reps SAQ's 10 reps SLR's 10 reps ABD Followed by ICE     General Comments        Pertinent Vitals/Pain C/o 7/10 pain during TE's Pre medicated ICE applied    Home Living                      Prior Function            PT Goals (current goals can now be found in the care plan section) Progress towards PT goals: Progressing toward goals    Frequency  7X/week    PT Plan      Co-evaluation             End of Session Equipment Utilized During Treatment: Gait belt Activity Tolerance: Patient tolerated treatment well Patient left: in chair;with call bell/phone within reach     Time: 1330-1416 PT Time Calculation (min): 46 min  Charges:  $Gait Training: 8-22 mins $Therapeutic Exercise: 8-22 mins $Therapeutic Activity: 8-22 mins                    G Codes:      Rica Koyanagi  PTA [facility]  Acute  Rehab Pager      [phone]

## 2014-08-31 ENCOUNTER — Other Ambulatory Visit: Payer: Self-pay | Admitting: Internal Medicine

## 2014-10-17 DIAGNOSIS — C4492 Squamous cell carcinoma of skin, unspecified: Secondary | ICD-10-CM

## 2014-10-17 HISTORY — DX: Squamous cell carcinoma of skin, unspecified: C44.92

## 2014-10-30 ENCOUNTER — Encounter (HOSPITAL_COMMUNITY): Payer: Self-pay | Admitting: *Deleted

## 2014-10-30 ENCOUNTER — Emergency Department (HOSPITAL_COMMUNITY)
Admission: EM | Admit: 2014-10-30 | Discharge: 2014-10-31 | Disposition: A | Discharge status: Home / Self Care | Payer: Medicare Other | Attending: Emergency Medicine | Admitting: Emergency Medicine

## 2014-10-30 DIAGNOSIS — I10 Essential (primary) hypertension: Secondary | ICD-10-CM | POA: Insufficient documentation

## 2014-10-30 DIAGNOSIS — I2510 Atherosclerotic heart disease of native coronary artery without angina pectoris: Secondary | ICD-10-CM | POA: Diagnosis not present

## 2014-10-30 DIAGNOSIS — R42 Dizziness and giddiness: Secondary | ICD-10-CM | POA: Diagnosis present

## 2014-10-30 DIAGNOSIS — K219 Gastro-esophageal reflux disease without esophagitis: Secondary | ICD-10-CM | POA: Diagnosis not present

## 2014-10-30 DIAGNOSIS — N400 Enlarged prostate without lower urinary tract symptoms: Secondary | ICD-10-CM | POA: Diagnosis not present

## 2014-10-30 DIAGNOSIS — Z79899 Other long term (current) drug therapy: Secondary | ICD-10-CM | POA: Insufficient documentation

## 2014-10-30 DIAGNOSIS — Z7901 Long term (current) use of anticoagulants: Secondary | ICD-10-CM | POA: Diagnosis not present

## 2014-10-30 DIAGNOSIS — Z9861 Coronary angioplasty status: Secondary | ICD-10-CM | POA: Diagnosis not present

## 2014-10-30 DIAGNOSIS — E785 Hyperlipidemia, unspecified: Secondary | ICD-10-CM | POA: Diagnosis not present

## 2014-10-30 DIAGNOSIS — M1990 Unspecified osteoarthritis, unspecified site: Secondary | ICD-10-CM | POA: Diagnosis not present

## 2014-10-30 DIAGNOSIS — R6883 Chills (without fever): Secondary | ICD-10-CM | POA: Diagnosis not present

## 2014-10-30 LAB — COMPREHENSIVE METABOLIC PANEL
ALK PHOS: 58 U/L (ref 39–117)
ALT: 15 U/L (ref 0–53)
AST: 17 U/L (ref 0–37)
Albumin: 4 g/dL (ref 3.5–5.2)
Anion gap: 10 (ref 5–15)
BUN: 11 mg/dL (ref 6–23)
CHLORIDE: 95 meq/L — AB (ref 96–112)
CO2: 28 mEq/L (ref 19–32)
Calcium: 9.6 mg/dL (ref 8.4–10.5)
Creatinine, Ser: 0.85 mg/dL (ref 0.50–1.35)
GFR calc non Af Amer: 87 mL/min — ABNORMAL LOW (ref >90)
GLUCOSE: 121 mg/dL — AB (ref 70–99)
POTASSIUM: 4.1 meq/L (ref 3.7–5.3)
Sodium: 133 mEq/L — ABNORMAL LOW (ref 137–147)
Total Bilirubin: 0.2 mg/dL — ABNORMAL LOW (ref 0.3–1.2)
Total Protein: 7 g/dL (ref 6.0–8.3)

## 2014-10-30 LAB — CBC WITH DIFFERENTIAL/PLATELET
Basophils Absolute: 0 10*3/uL (ref 0.0–0.1)
Basophils Relative: 0 % (ref 0–1)
Eosinophils Absolute: 0.3 10*3/uL (ref 0.0–0.7)
Eosinophils Relative: 6 % — ABNORMAL HIGH (ref 0–5)
HCT: 39.9 % (ref 39.0–52.0)
Hemoglobin: 13.4 g/dL (ref 13.0–17.0)
LYMPHS ABS: 1 10*3/uL (ref 0.7–4.0)
LYMPHS PCT: 19 % (ref 12–46)
MCH: 31.3 pg (ref 26.0–34.0)
MCHC: 33.6 g/dL (ref 30.0–36.0)
MCV: 93.2 fL (ref 78.0–100.0)
MONO ABS: 0.5 10*3/uL (ref 0.1–1.0)
Monocytes Relative: 10 % (ref 3–12)
NEUTROS ABS: 3.4 10*3/uL (ref 1.7–7.7)
Neutrophils Relative %: 65 % (ref 43–77)
Platelets: 131 10*3/uL — ABNORMAL LOW (ref 150–400)
RBC: 4.28 MIL/uL (ref 4.22–5.81)
RDW: 13 % (ref 11.5–15.5)
WBC: 5.3 10*3/uL (ref 4.0–10.5)

## 2014-10-30 LAB — PROTIME-INR
INR: 0.99 (ref 0.00–1.49)
Prothrombin Time: 13.2 seconds (ref 11.6–15.2)

## 2014-10-30 LAB — I-STAT TROPONIN, ED: Troponin i, poc: 0.01 ng/mL (ref 0.00–0.08)

## 2014-10-30 NOTE — ED Notes (Signed)
Pt reports dizzy spells 2 or 3 times a day for the last 3 months and worse over the last week.  Pt reports he had a "really strong spell this morning and one after dinner." Pt denies cp, palpitations, blurred vision or any change in LOC. Pt states he feels weak.

## 2014-10-30 NOTE — ED Notes (Signed)
Pt reports dizziness, weakness, chills, and lower back pain for the past three months. Pt is on blood thinner for cardiac stent. Pt denies any bleeding or bruising. Denies shortness of breath. Denies any unilateral weakness, grips strong and equal, steady gait, no facial droop. Pt reports dizzy spells 1-2 times a day. Denies n/v/d.

## 2014-10-30 NOTE — ED Provider Notes (Signed)
CSN: [id number]     Arrival date & time 10/30/14  2016 History   First MD Initiated Contact with Patient 10/30/14 2258     Chief Complaint  Patient presents with  . Dizziness  . Chills     (Consider location/radiation/quality/duration/timing/severity/associated sxs/prior Treatment) HPI 68 year old male presents to the emergency department from home with complaint of several weeks to months of daily spells of wooziness.  Patient has a hard time describing these spells, reports that he usually chews ice or takes a hot shower to help relieve them.  They last anywhere from 30-45 minutes.  Patient reports he feels heavy headed, slowed in his thinking during these episodes and feels that if they worsen he might pass out.  Patient denies any vertigo symptoms, no focal neuro deficits during these episodes.  Patient was seen by his doctor recently, and started on Xanax and Celebrex to help with some sleep deprivation that he was having thought to be due to some low back pain.  Wife is concerned that patient is having anxiety episodes.  She reports that after a recent knee surgery he dropped his blood counts are low and nearly passed out and ended up in the ICU.  Patient agrees that he may have some anxiety component.  He denies any weight loss.  He reports he has gained a few pounds.  He reports is not quite as active as he was prior to his knee surgery.  He denies any chest pain shortness of breath headache.  No change in appetite.  No difficulties thinking speaking or moving any extremities, no clumsiness.  Patient denies any worsening of his symptoms with change in position.  There is no relation to time of day or meals.  He presents today as he had 2 episodes that were slightly worse than usual.  He has follow-up with his doctor this Wednesday.  Past Medical History  Diagnosis Date  . Hyperlipidemia   . BPH (benign prostatic hyperplasia)   . Arthritis   . CAD (coronary artery disease)     Diagnosed  12/2011 following abnormal treadmill test - high grade OM stenosis s/p DES 01/05/12, mild residual non-obstructive LAD, Lcx, RCA disease.  Normal LV function with EF 55-65% by cath 01/05/12  . Hypertension   . GERD (gastroesophageal reflux disease)   . PONV (postoperative nausea and vomiting)    Past Surgical History  Procedure Laterality Date  . Knee arthroscopy      bilateral  . Appendectomy    . Tonsillectomy and adenoidectomy    . Nasal septum surgery    . Femur surgery    . Coronary stents     . Right middle finger amputation     . Total knee arthroplasty Left 07/11/2013    Procedure: LEFT TOTAL KNEE ARTHROPLASTY ;  Surgeon: Gearlean Alf, MD;  Location: [facility];  Service: Orthopedics;  Laterality: Left;  . Total knee arthroplasty Right 07/10/2014    Procedure: RIGHT TOTAL KNEE ARTHROPLASTY;  Surgeon: Gearlean Alf, MD;  Location: [facility];  Service: Orthopedics;  Laterality: Right;   Family History  Problem Relation Age of Onset  . Coronary artery disease Father     "Hardening of the arteries"   History  Substance Use Topics  . Smoking status: Never Smoker   . Smokeless tobacco: Never Used  . Alcohol Use: 8.4 oz/week    14 Glasses of wine per week    Review of Systems  See History of Present Illness; otherwise all  other systems are reviewed and negative   Allergies  Codeine; Oxycodone; and Tramadol  Home Medications   Prior to Admission medications   Medication Sig Start Date End Date Taking? Authorizing Provider  acetaminophen (TYLENOL) 500 MG tablet Take 1,000 mg by mouth every 4 (four) hours as needed for pain. Pain   Yes Historical Provider, MD  ALPRAZolam (XANAX) 0.5 MG tablet Take 0.5 mg by mouth at bedtime.    Yes Historical Provider, MD  atorvastatin (LIPITOR) 20 MG tablet Take 20 mg by mouth every morning.   Yes Historical Provider, MD  celecoxib (CELEBREX) 200 MG capsule Take 200 mg by mouth every evening.   Yes Historical Provider, MD  docusate sodium  (COLACE) 100 MG capsule Take 100 mg by mouth daily as needed for mild constipation.   Yes Historical Provider, MD  metoprolol succinate (TOPROL-XL) 50 MG 24 hr tablet Take 25 mg by mouth every morning. Take with or immediately following a meal.   Yes Historical Provider, MD  nitroGLYCERIN (NITROSTAT) 0.4 MG SL tablet Place 0.4 mg under the tongue every 5 (five) minutes as needed for chest pain.   Yes Historical Provider, MD  pantoprazole (PROTONIX) 40 MG tablet Take 40 mg by mouth daily.   Yes Historical Provider, MD  tamsulosin (FLOMAX) 0.4 MG CAPS Take 0.4 mg by mouth 2 (two) times daily.    Yes Historical Provider, MD  HYDROmorphone (DILAUDID) 2 MG tablet Take 1-2 tablets (2-4 mg total) by mouth every 3 (three) hours as needed for moderate pain or severe pain. Patient not taking: Reported on 10/30/2014 07/12/14   Arlee Muslim, PA-C  methocarbamol (ROBAXIN) 500 MG tablet Take 1 tablet (500 mg total) by mouth every 6 (six) hours as needed for muscle spasms. Patient not taking: Reported on 10/30/2014 07/12/14   Arlee Muslim, PA-C  rivaroxaban (XARELTO) 10 MG TABS tablet Take 1 tablet (10 mg total) by mouth daily with breakfast. Take Xarelto for two and a half more weeks, then discontinue Xarelto. Once the patient has completed the Xarelto, they may resume the 81 mg Aspirin and the Plavix 75 mg daily. Patient not taking: Reported on 10/30/2014 07/12/14   Arlee Muslim, PA-C   BP 169/80 mmHg  Pulse 73  Temp(Src) 97.5 F (36.4 C) (Oral)  Resp 12  Ht 5\' 9"  (1.753 m)  Wt 176 lb (79.833 kg)  BMI 25.98 kg/m2  SpO2 98% Physical Exam  Constitutional: He is oriented to person, place, and time. He appears well-developed and well-nourished. He appears distressed (anxious appearing).  HENT:  Head: Normocephalic and atraumatic.  Nose: Nose normal.  Mouth/Throat: Oropharynx is clear and moist.  Eyes: Conjunctivae and EOM are normal. Pupils are equal, round, and reactive to light.  Neck: Normal range of  motion. Neck supple. No JVD present. No tracheal deviation present. No thyromegaly present.  Cardiovascular: Normal rate, regular rhythm, normal heart sounds and intact distal pulses.  Exam reveals no gallop and no friction rub.   No murmur heard. Pulmonary/Chest: Effort normal and breath sounds normal. No stridor. No respiratory distress. He has no wheezes. He has no rales. He exhibits no tenderness.  Abdominal: Soft. Bowel sounds are normal. He exhibits no distension and no mass. There is no tenderness. There is no rebound and no guarding.  Musculoskeletal: Normal range of motion. He exhibits no edema or tenderness.  Lymphadenopathy:    He has no cervical adenopathy.  Neurological: He is alert and oriented to person, place, and time. He displays normal reflexes.  No cranial nerve deficit. He exhibits normal muscle tone. Coordination normal.  Skin: Skin is warm and dry. No rash noted. No erythema. No pallor.  Psychiatric: He has a normal mood and affect. His behavior is normal. Judgment and thought content normal.  Nursing note and vitals reviewed.   ED Course  Procedures (including critical care time) Labs Review Labs Reviewed  CBC WITH DIFFERENTIAL - Abnormal; Notable for the following:    Platelets 131 (*)    Eosinophils Relative 6 (*)    All other components within normal limits  COMPREHENSIVE METABOLIC PANEL - Abnormal; Notable for the following:    Sodium 133 (*)    Chloride 95 (*)    Glucose, Bld 121 (*)    Total Bilirubin 0.2 (*)    GFR calc non Af Amer 87 (*)    All other components within normal limits  PROTIME-INR  URINALYSIS, ROUTINE W REFLEX MICROSCOPIC  I-STAT TROPOININ, ED    Imaging Review No results found.   EKG Interpretation   Date/Time:  Monday October 30 2014 20:44:55 EST Ventricular Rate:  73 PR Interval:  146 QRS Duration: 94 QT Interval:  402 QTC Calculation: 442 R Axis:   62 Text Interpretation:  Sinus rhythm with occasional Premature  ventricular  complexes Otherwise normal ECG pvc new from prior Confirmed by Jaiveon Suppes  MD,  Mickell Birdwell ([id number]) on 10/30/2014 11:08:48 PM      MDM   Final diagnoses:  Dizziness of unknown cause    68 year old male with episodes of wooziness ongoing for some time.  Patient is mildly hypertensive here in the emergency department, but no end organ involvement.  I have explained to the patient that I do not have a specific diagnosis for his symptoms.  He has a normal neuro exam, no electrolyte abnormalities, no anemia.  He is mildly orthostatic.  Will encourage him to increase his fluid intake and follow-up as scheduled on Wednesday.    Kalman Drape, MD 10/30/14 [phone]

## 2014-10-30 NOTE — Discharge Instructions (Signed)
Based on your workup in the emergency department today, there is not a clear cause for your symptoms.  Please go over your medications with your primary care doctor.  Increase your fluid intake.  Return to the emerge program for worsening condition or new concerning symptoms.   Dizziness Dizziness is a common problem. It is a feeling of unsteadiness or light-headedness. You may feel like you are about to faint. Dizziness can lead to injury if you stumble or fall. A person of any age group can suffer from dizziness, but dizziness is more common in older adults. CAUSES  Dizziness can be caused by many different things, including:  Middle ear problems.  Standing for too long.  Infections.  An allergic reaction.  Aging.  An emotional response to something, such as the sight of blood.  Side effects of medicines.  Tiredness.  Problems with circulation or blood pressure.  Excessive use of alcohol or medicines, or illegal drug use.  Breathing too fast (hyperventilation).  An irregular heart rhythm (arrhythmia).  A low red blood cell count (anemia).  Pregnancy.  Vomiting, diarrhea, fever, or other illnesses that cause body fluid loss (dehydration).  Diseases or conditions such as Parkinson's disease, high blood pressure (hypertension), diabetes, and thyroid problems.  Exposure to extreme heat. DIAGNOSIS  Your health care provider will ask about your symptoms, perform a physical exam, and perform an electrocardiogram (ECG) to record the electrical activity of your heart. Your health care provider may also perform other heart or blood tests to determine the cause of your dizziness. These may include:  Transthoracic echocardiogram (TTE). During echocardiography, sound waves are used to evaluate how blood flows through your heart.  Transesophageal echocardiogram (TEE).  Cardiac monitoring. This allows your health care provider to monitor your heart rate and rhythm in real  time.  Holter monitor. This is a portable device that records your heartbeat and can help diagnose heart arrhythmias. It allows your health care provider to track your heart activity for several days if needed.  Stress tests by exercise or by giving medicine that makes the heart beat faster. TREATMENT  Treatment of dizziness depends on the cause of your symptoms and can vary greatly. HOME CARE INSTRUCTIONS   Drink enough fluids to keep your urine clear or pale yellow. This is especially important in very hot weather. In older adults, it is also important in cold weather.  Take your medicine exactly as directed if your dizziness is caused by medicines. When taking blood pressure medicines, it is especially important to get up slowly.  Rise slowly from chairs and steady yourself until you feel okay.  In the morning, first sit up on the side of the bed. When you feel okay, stand slowly while holding onto something until you know your balance is fine.  Move your legs often if you need to stand in one place for a long time. Tighten and relax your muscles in your legs while standing.  Have someone stay with you for 1-2 days if dizziness continues to be a problem. Do this until you feel you are well enough to stay alone. Have the person call your health care provider if he or she notices changes in you that are concerning.  Do not drive or use heavy machinery if you feel dizzy.  Do not drink alcohol. SEEK IMMEDIATE MEDICAL CARE IF:   Your dizziness or light-headedness gets worse.  You feel nauseous or vomit.  You have problems talking, walking, or using your arms,  hands, or legs.  You feel weak.  You are not thinking clearly or you have trouble forming sentences. It may take a friend or family member to notice this.  You have chest pain, abdominal pain, shortness of breath, or sweating.  Your vision changes.  You notice any bleeding.  You have side effects from medicine that seems  to be getting worse rather than better. MAKE SURE YOU:   Understand these instructions.  Will watch your condition.  Will get help right away if you are not doing well or get worse. Document Released: 05/27/2001 Document Revised: 12/06/2013 Document Reviewed: 06/20/2011 [facility] Patient Information 2015 [place], [place]. This information is not intended to replace advice given to you by your health care provider. Make sure you discuss any questions you have with your health care provider.   Near-Syncope Near-syncope (commonly known as near fainting) is sudden weakness, dizziness, or feeling like you might pass out. This can happen when getting up or while standing for a long time. It is caused by a sudden decrease in blood flow to the brain, which can occur for various reasons. Most of the reasons are not serious.  HOME CARE Watch your condition for any changes.  Have someone stay with you until you feel stable.  If you feel like you are going to pass out:  Lie down right away.  Prop your feet up if you can.  Breathe deeply and steadily.  Move only when the feeling has gone away. Most of the time, this feeling lasts only a few minutes. You may feel tired for several hours.  Drink enough fluids to keep your pee (urine) clear or pale yellow.  If you are taking blood pressure or heart medicine, stand up slowly.  Follow up with your doctor as told. GET HELP RIGHT AWAY IF:   You have a severe headache.  You have unusual pain in the chest, belly (abdomen), or back.  You have bleeding from the mouth or butt (rectum), or you have black or tarry poop (stool).  You feel your heart beat differently than normal, or you have a very fast pulse.  You pass out, or you twitch and shake when you pass out.  You pass out when sitting or lying down.  You feel confused.  You have trouble walking.  You are weak.  You have vision problems. MAKE SURE YOU:   Understand these  instructions.  Will watch your condition.  Will get help right away if you are not doing well or get worse. Document Released: 05/19/2008 Document Revised: 12/06/2013 Document Reviewed: 05/06/2013 [facility] Patient Information 2015 [place], [place]. This information is not intended to replace advice given to you by your health care provider. Make sure you discuss any questions you have with your health care provider.

## 2014-10-31 LAB — URINALYSIS, ROUTINE W REFLEX MICROSCOPIC
BILIRUBIN URINE: NEGATIVE
Glucose, UA: NEGATIVE mg/dL
Ketones, ur: NEGATIVE mg/dL
Leukocytes, UA: NEGATIVE
Nitrite: NEGATIVE
PH: 6.5 (ref 5.0–8.0)
Protein, ur: NEGATIVE mg/dL
Specific Gravity, Urine: 1.008 (ref 1.005–1.030)
UROBILINOGEN UA: 1 mg/dL (ref 0.0–1.0)

## 2014-10-31 LAB — URINE MICROSCOPIC-ADD ON

## 2014-11-23 ENCOUNTER — Encounter (HOSPITAL_COMMUNITY): Payer: Self-pay | Admitting: Cardiology

## 2014-12-26 DIAGNOSIS — H02831 Dermatochalasis of right upper eyelid: Secondary | ICD-10-CM | POA: Diagnosis not present

## 2014-12-26 DIAGNOSIS — H2513 Age-related nuclear cataract, bilateral: Secondary | ICD-10-CM | POA: Diagnosis not present

## 2014-12-26 DIAGNOSIS — H02834 Dermatochalasis of left upper eyelid: Secondary | ICD-10-CM | POA: Diagnosis not present

## 2015-01-18 DIAGNOSIS — C44611 Basal cell carcinoma of skin of unspecified upper limb, including shoulder: Secondary | ICD-10-CM | POA: Diagnosis not present

## 2015-01-18 DIAGNOSIS — D0430 Carcinoma in situ of skin of unspecified part of face: Secondary | ICD-10-CM | POA: Diagnosis not present

## 2015-03-21 DIAGNOSIS — Z1211 Encounter for screening for malignant neoplasm of colon: Secondary | ICD-10-CM | POA: Diagnosis not present

## 2015-03-21 DIAGNOSIS — D124 Benign neoplasm of descending colon: Secondary | ICD-10-CM | POA: Diagnosis not present

## 2015-03-26 DIAGNOSIS — C4490 Unspecified malignant neoplasm of skin, unspecified: Secondary | ICD-10-CM | POA: Diagnosis not present

## 2015-03-26 DIAGNOSIS — F419 Anxiety disorder, unspecified: Secondary | ICD-10-CM | POA: Diagnosis not present

## 2015-03-26 DIAGNOSIS — I2510 Atherosclerotic heart disease of native coronary artery without angina pectoris: Secondary | ICD-10-CM | POA: Diagnosis not present

## 2015-05-10 DIAGNOSIS — Z471 Aftercare following joint replacement surgery: Secondary | ICD-10-CM | POA: Diagnosis not present

## 2015-05-10 DIAGNOSIS — Z96651 Presence of right artificial knee joint: Secondary | ICD-10-CM | POA: Diagnosis not present

## 2015-05-21 DIAGNOSIS — C44612 Basal cell carcinoma of skin of right upper limb, including shoulder: Secondary | ICD-10-CM | POA: Diagnosis not present

## 2015-05-21 DIAGNOSIS — L570 Actinic keratosis: Secondary | ICD-10-CM | POA: Diagnosis not present

## 2015-05-21 DIAGNOSIS — C44519 Basal cell carcinoma of skin of other part of trunk: Secondary | ICD-10-CM | POA: Diagnosis not present

## 2015-05-21 DIAGNOSIS — D0430 Carcinoma in situ of skin of unspecified part of face: Secondary | ICD-10-CM | POA: Diagnosis not present

## 2015-05-21 DIAGNOSIS — D0439 Carcinoma in situ of skin of other parts of face: Secondary | ICD-10-CM | POA: Diagnosis not present

## 2015-05-30 DIAGNOSIS — I70209 Unspecified atherosclerosis of native arteries of extremities, unspecified extremity: Secondary | ICD-10-CM | POA: Diagnosis not present

## 2015-05-30 DIAGNOSIS — M5136 Other intervertebral disc degeneration, lumbar region: Secondary | ICD-10-CM | POA: Diagnosis not present

## 2015-06-11 ENCOUNTER — Other Ambulatory Visit: Payer: Self-pay

## 2015-06-21 DIAGNOSIS — D485 Neoplasm of uncertain behavior of skin: Secondary | ICD-10-CM | POA: Diagnosis not present

## 2015-06-21 DIAGNOSIS — L570 Actinic keratosis: Secondary | ICD-10-CM | POA: Diagnosis not present

## 2015-06-21 DIAGNOSIS — D0430 Carcinoma in situ of skin of unspecified part of face: Secondary | ICD-10-CM | POA: Diagnosis not present

## 2015-06-21 DIAGNOSIS — C44310 Basal cell carcinoma of skin of unspecified parts of face: Secondary | ICD-10-CM | POA: Diagnosis not present

## 2015-06-21 DIAGNOSIS — C44519 Basal cell carcinoma of skin of other part of trunk: Secondary | ICD-10-CM | POA: Diagnosis not present

## 2015-07-02 ENCOUNTER — Telehealth: Payer: Self-pay | Admitting: *Deleted

## 2015-07-02 NOTE — Telephone Encounter (Signed)
Requesting surgical clearance:   1. Type of surgery: Epidural Spine Injection  2. Surgeon: Dr Nelva Bush  3. Surgical date:   4. Medications that need to be help: Plavix  As Per Dr Percival Spanish patient is ok to hold plavix x7 days prior to procedure

## 2015-07-09 ENCOUNTER — Telehealth: Payer: Self-pay | Admitting: *Deleted

## 2015-07-09 NOTE — Telephone Encounter (Signed)
Requesting surgical clearance:   1. Type of surgery: Epidural Steroid Injection  2. Surgeon: Dr Nelva Bush  3. Surgical date: Pending  4. Medications that need to be help: Plavix  Ok to hold Plavix for 5 days prior if need to proceed with Spinal Injection  Clearance was [facility]/Dr Ramos/Dr Jeanmarie Hubert via [employer]

## 2015-07-19 DIAGNOSIS — M159 Polyosteoarthritis, unspecified: Secondary | ICD-10-CM | POA: Diagnosis not present

## 2015-07-19 DIAGNOSIS — I2510 Atherosclerotic heart disease of native coronary artery without angina pectoris: Secondary | ICD-10-CM | POA: Diagnosis not present

## 2015-08-07 DIAGNOSIS — M5136 Other intervertebral disc degeneration, lumbar region: Secondary | ICD-10-CM | POA: Diagnosis not present

## 2015-08-23 DIAGNOSIS — M5136 Other intervertebral disc degeneration, lumbar region: Secondary | ICD-10-CM | POA: Diagnosis not present

## 2015-08-23 DIAGNOSIS — Z23 Encounter for immunization: Secondary | ICD-10-CM | POA: Diagnosis not present

## 2015-09-04 DIAGNOSIS — D485 Neoplasm of uncertain behavior of skin: Secondary | ICD-10-CM | POA: Diagnosis not present

## 2015-09-04 DIAGNOSIS — C44611 Basal cell carcinoma of skin of unspecified upper limb, including shoulder: Secondary | ICD-10-CM | POA: Diagnosis not present

## 2015-09-04 DIAGNOSIS — C4441 Basal cell carcinoma of skin of scalp and neck: Secondary | ICD-10-CM | POA: Diagnosis not present

## 2015-09-04 DIAGNOSIS — C44619 Basal cell carcinoma of skin of left upper limb, including shoulder: Secondary | ICD-10-CM | POA: Diagnosis not present

## 2015-09-04 DIAGNOSIS — L821 Other seborrheic keratosis: Secondary | ICD-10-CM | POA: Diagnosis not present

## 2015-09-04 DIAGNOSIS — L570 Actinic keratosis: Secondary | ICD-10-CM | POA: Diagnosis not present

## 2015-10-25 DIAGNOSIS — D0439 Carcinoma in situ of skin of other parts of face: Secondary | ICD-10-CM | POA: Diagnosis not present

## 2015-10-25 DIAGNOSIS — C44611 Basal cell carcinoma of skin of unspecified upper limb, including shoulder: Secondary | ICD-10-CM | POA: Diagnosis not present

## 2015-10-25 DIAGNOSIS — D0430 Carcinoma in situ of skin of unspecified part of face: Secondary | ICD-10-CM | POA: Diagnosis not present

## 2015-11-26 DIAGNOSIS — C44612 Basal cell carcinoma of skin of right upper limb, including shoulder: Secondary | ICD-10-CM | POA: Diagnosis not present

## 2015-11-26 DIAGNOSIS — C44519 Basal cell carcinoma of skin of other part of trunk: Secondary | ICD-10-CM | POA: Diagnosis not present

## 2015-11-26 DIAGNOSIS — L910 Hypertrophic scar: Secondary | ICD-10-CM | POA: Diagnosis not present

## 2015-12-20 DIAGNOSIS — C4441 Basal cell carcinoma of skin of scalp and neck: Secondary | ICD-10-CM | POA: Diagnosis not present

## 2015-12-21 DIAGNOSIS — C4441 Basal cell carcinoma of skin of scalp and neck: Secondary | ICD-10-CM | POA: Diagnosis not present

## 2015-12-24 ENCOUNTER — Encounter (HOSPITAL_BASED_OUTPATIENT_CLINIC_OR_DEPARTMENT_OTHER): Payer: Self-pay | Admitting: *Deleted

## 2015-12-24 ENCOUNTER — Other Ambulatory Visit: Payer: Self-pay | Admitting: Plastic Surgery

## 2015-12-24 DIAGNOSIS — M952 Other acquired deformity of head: Secondary | ICD-10-CM

## 2015-12-24 DIAGNOSIS — Z9889 Other specified postprocedural states: Secondary | ICD-10-CM

## 2015-12-27 ENCOUNTER — Encounter (HOSPITAL_BASED_OUTPATIENT_CLINIC_OR_DEPARTMENT_OTHER): Payer: Self-pay | Admitting: *Deleted

## 2015-12-27 ENCOUNTER — Ambulatory Visit (HOSPITAL_BASED_OUTPATIENT_CLINIC_OR_DEPARTMENT_OTHER): Payer: Medicare Other | Admitting: Anesthesiology

## 2015-12-27 ENCOUNTER — Ambulatory Visit (HOSPITAL_BASED_OUTPATIENT_CLINIC_OR_DEPARTMENT_OTHER)
Admission: RE | Admit: 2015-12-27 | Discharge: 2015-12-27 | Disposition: A | Discharge status: Home / Self Care | Payer: Medicare Other | Source: Ambulatory Visit | Attending: Plastic Surgery | Admitting: Plastic Surgery

## 2015-12-27 ENCOUNTER — Encounter (HOSPITAL_BASED_OUTPATIENT_CLINIC_OR_DEPARTMENT_OTHER)
Admission: RE | Disposition: A | Discharge status: Home / Self Care | Payer: Self-pay | Source: Ambulatory Visit | Attending: Plastic Surgery

## 2015-12-27 DIAGNOSIS — Z9889 Other specified postprocedural states: Secondary | ICD-10-CM

## 2015-12-27 DIAGNOSIS — Z7982 Long term (current) use of aspirin: Secondary | ICD-10-CM | POA: Insufficient documentation

## 2015-12-27 DIAGNOSIS — C4441 Basal cell carcinoma of skin of scalp and neck: Secondary | ICD-10-CM | POA: Diagnosis not present

## 2015-12-27 DIAGNOSIS — Z96653 Presence of artificial knee joint, bilateral: Secondary | ICD-10-CM | POA: Insufficient documentation

## 2015-12-27 DIAGNOSIS — Z79899 Other long term (current) drug therapy: Secondary | ICD-10-CM | POA: Diagnosis not present

## 2015-12-27 DIAGNOSIS — M952 Other acquired deformity of head: Secondary | ICD-10-CM | POA: Diagnosis not present

## 2015-12-27 DIAGNOSIS — Z89021 Acquired absence of right finger(s): Secondary | ICD-10-CM | POA: Diagnosis not present

## 2015-12-27 DIAGNOSIS — Z85828 Personal history of other malignant neoplasm of skin: Secondary | ICD-10-CM | POA: Insufficient documentation

## 2015-12-27 DIAGNOSIS — L988 Other specified disorders of the skin and subcutaneous tissue: Secondary | ICD-10-CM | POA: Diagnosis not present

## 2015-12-27 DIAGNOSIS — M1990 Unspecified osteoarthritis, unspecified site: Secondary | ICD-10-CM | POA: Insufficient documentation

## 2015-12-27 DIAGNOSIS — Z7902 Long term (current) use of antithrombotics/antiplatelets: Secondary | ICD-10-CM | POA: Insufficient documentation

## 2015-12-27 DIAGNOSIS — I2510 Atherosclerotic heart disease of native coronary artery without angina pectoris: Secondary | ICD-10-CM | POA: Insufficient documentation

## 2015-12-27 DIAGNOSIS — Z955 Presence of coronary angioplasty implant and graft: Secondary | ICD-10-CM | POA: Insufficient documentation

## 2015-12-27 DIAGNOSIS — Z9049 Acquired absence of other specified parts of digestive tract: Secondary | ICD-10-CM | POA: Insufficient documentation

## 2015-12-27 DIAGNOSIS — N400 Enlarged prostate without lower urinary tract symptoms: Secondary | ICD-10-CM | POA: Insufficient documentation

## 2015-12-27 DIAGNOSIS — K219 Gastro-esophageal reflux disease without esophagitis: Secondary | ICD-10-CM | POA: Diagnosis not present

## 2015-12-27 DIAGNOSIS — E785 Hyperlipidemia, unspecified: Secondary | ICD-10-CM | POA: Diagnosis not present

## 2015-12-27 DIAGNOSIS — I10 Essential (primary) hypertension: Secondary | ICD-10-CM | POA: Diagnosis not present

## 2015-12-27 DIAGNOSIS — T8189XA Other complications of procedures, not elsewhere classified, initial encounter: Secondary | ICD-10-CM | POA: Diagnosis not present

## 2015-12-27 HISTORY — PX: APPLICATION OF A-CELL OF HEAD/NECK: SHX6304

## 2015-12-27 HISTORY — DX: Panic disorder (episodic paroxysmal anxiety): F41.0

## 2015-12-27 HISTORY — DX: Anxiety disorder, unspecified: F41.9

## 2015-12-27 SURGERY — APPLICATION, ACELLULAR DERMAL REPLACEMENT, HEAD
Anesthesia: General | Site: Head

## 2015-12-27 MED ORDER — LIDOCAINE HCL (CARDIAC) 20 MG/ML IV SOLN
INTRAVENOUS | Status: DC | PRN
  Administered 2015-12-27: 75 mg via INTRAVENOUS

## 2015-12-27 MED ORDER — FENTANYL CITRATE (PF) 100 MCG/2ML IJ SOLN
INTRAMUSCULAR | Status: AC
  Filled 2015-12-27: qty 2

## 2015-12-27 MED ORDER — MIDAZOLAM HCL 2 MG/2ML IJ SOLN: 1.0000 mg | INTRAMUSCULAR | Status: DC | PRN

## 2015-12-27 MED ORDER — HYDROCODONE-ACETAMINOPHEN 5-325 MG PO TABS: 1.0000 | ORAL_TABLET | Freq: Four times a day (QID) | ORAL | Status: DC | PRN

## 2015-12-27 MED ORDER — CEFAZOLIN SODIUM-DEXTROSE 2-3 GM-% IV SOLR
INTRAVENOUS | Status: AC
  Filled 2015-12-27: qty 50

## 2015-12-27 MED ORDER — ONDANSETRON HCL 4 MG/2ML IJ SOLN
INTRAMUSCULAR | Status: AC
  Filled 2015-12-27: qty 2

## 2015-12-27 MED ORDER — HYDROMORPHONE HCL 1 MG/ML IJ SOLN
0.5000 mg | INTRAMUSCULAR | Status: DC | PRN
  Administered 2015-12-27 (×3): 0.25 mg via INTRAVENOUS

## 2015-12-27 MED ORDER — PROPOFOL 10 MG/ML IV BOLUS
INTRAVENOUS | Status: DC | PRN
  Administered 2015-12-27: 200 mg via INTRAVENOUS

## 2015-12-27 MED ORDER — PHENYLEPHRINE 40 MCG/ML (10ML) SYRINGE FOR IV PUSH (FOR BLOOD PRESSURE SUPPORT)
PREFILLED_SYRINGE | INTRAVENOUS | Status: AC
  Filled 2015-12-27: qty 10

## 2015-12-27 MED ORDER — HYDROMORPHONE HCL 1 MG/ML IJ SOLN
INTRAMUSCULAR | Status: AC
  Filled 2015-12-27: qty 1

## 2015-12-27 MED ORDER — EPHEDRINE SULFATE 50 MG/ML IJ SOLN
INTRAMUSCULAR | Status: DC | PRN
  Administered 2015-12-27: 10 mg via INTRAVENOUS

## 2015-12-27 MED ORDER — ONDANSETRON HCL 4 MG/2ML IJ SOLN
4.0000 mg | Freq: Once | INTRAMUSCULAR | Status: AC | PRN
  Administered 2015-12-27: 4 mg via INTRAVENOUS

## 2015-12-27 MED ORDER — DEXAMETHASONE SODIUM PHOSPHATE 10 MG/ML IJ SOLN
INTRAMUSCULAR | Status: AC
  Filled 2015-12-27: qty 1

## 2015-12-27 MED ORDER — PROPOFOL 10 MG/ML IV BOLUS
INTRAVENOUS | Status: AC
  Filled 2015-12-27: qty 20

## 2015-12-27 MED ORDER — ONDANSETRON 4 MG PO TBDP
4.0000 mg | ORAL_TABLET | Freq: Three times a day (TID) | ORAL | Status: DC | PRN
Start: 2015-12-27 — End: 2016-02-01

## 2015-12-27 MED ORDER — CEFAZOLIN SODIUM-DEXTROSE 2-3 GM-% IV SOLR
2.0000 g | INTRAVENOUS | Status: AC
  Administered 2015-12-27: 2 g via INTRAVENOUS

## 2015-12-27 MED ORDER — DEXAMETHASONE SODIUM PHOSPHATE 4 MG/ML IJ SOLN
INTRAMUSCULAR | Status: DC | PRN
  Administered 2015-12-27: 10 mg via INTRAVENOUS

## 2015-12-27 MED ORDER — SUCCINYLCHOLINE CHLORIDE 20 MG/ML IJ SOLN
INTRAMUSCULAR | Status: AC
  Filled 2015-12-27: qty 1

## 2015-12-27 MED ORDER — SCOPOLAMINE 1 MG/3DAYS TD PT72: 1.0000 | MEDICATED_PATCH | Freq: Once | TRANSDERMAL | Status: DC

## 2015-12-27 MED ORDER — PROPOFOL 500 MG/50ML IV EMUL
INTRAVENOUS | Status: AC
  Filled 2015-12-27: qty 50

## 2015-12-27 MED ORDER — LIDOCAINE HCL (CARDIAC) 20 MG/ML IV SOLN
INTRAVENOUS | Status: AC
  Filled 2015-12-27: qty 5

## 2015-12-27 MED ORDER — ONDANSETRON HCL 4 MG/2ML IJ SOLN
INTRAMUSCULAR | Status: DC | PRN
  Administered 2015-12-27: 4 mg via INTRAVENOUS

## 2015-12-27 MED ORDER — SODIUM CHLORIDE 0.9 % IR SOLN
Status: DC | PRN
  Administered 2015-12-27: 500 mL

## 2015-12-27 MED ORDER — FENTANYL CITRATE (PF) 100 MCG/2ML IJ SOLN
50.0000 ug | INTRAMUSCULAR | Status: AC | PRN
  Administered 2015-12-27: 25 ug via INTRAVENOUS
  Administered 2015-12-27: 100 ug via INTRAVENOUS
  Administered 2015-12-27 (×3): 25 ug via INTRAVENOUS

## 2015-12-27 MED ORDER — LACTATED RINGERS IV SOLN
INTRAVENOUS | Status: DC
  Administered 2015-12-27 (×2): via INTRAVENOUS

## 2015-12-27 MED ORDER — GLYCOPYRROLATE 0.2 MG/ML IJ SOLN: 0.2000 mg | Freq: Once | INTRAMUSCULAR | Status: DC | PRN

## 2015-12-27 SURGICAL SUPPLY — 51 items
APL SKNCLS STERI-STRIP NONHPOA (GAUZE/BANDAGES/DRESSINGS) ×1
BAG DECANTER FOR FLEXI CONT (MISCELLANEOUS) ×1 IMPLANT
BENZOIN TINCTURE PRP APPL 2/3 (GAUZE/BANDAGES/DRESSINGS) ×1 IMPLANT
BLADE HEX COATED 2.75 (ELECTRODE) ×1 IMPLANT
BLADE SURG 10 STRL SS (BLADE) ×1 IMPLANT
BLADE SURG 15 STRL LF DISP TIS (BLADE) ×1 IMPLANT
BLADE SURG 15 STRL SS (BLADE) ×2
BRIEF STRETCH FOR OB PAD LRG (UNDERPADS AND DIAPERS) IMPLANT
BUR EGG 3PK/BX (BURR) ×1 IMPLANT
CANISTER SUCT 1200ML W/VALVE (MISCELLANEOUS) ×1 IMPLANT
COVER BACK TABLE 60X90IN (DRAPES) ×2 IMPLANT
COVER MAYO STAND STRL (DRAPES) ×2 IMPLANT
DECANTER SPIKE VIAL GLASS SM (MISCELLANEOUS) IMPLANT
DRAPE INCISE IOBAN 66X45 STRL (DRAPES) ×1 IMPLANT
DRAPE U-SHAPE 76X120 STRL (DRAPES) ×2 IMPLANT
DRSG ADAPTIC 3X8 NADH LF (GAUZE/BANDAGES/DRESSINGS) ×1 IMPLANT
DRSG EMULSION OIL 3X3 NADH (GAUZE/BANDAGES/DRESSINGS) IMPLANT
DRSG PAD ABDOMINAL 8X10 ST (GAUZE/BANDAGES/DRESSINGS) IMPLANT
ELECT REM PT RETURN 9FT ADLT (ELECTROSURGICAL) ×2
ELECTRODE REM PT RTRN 9FT ADLT (ELECTROSURGICAL) ×1 IMPLANT
GAUZE SPONGE 4X4 12PLY STRL (GAUZE/BANDAGES/DRESSINGS) ×2 IMPLANT
GLOVE BIO SURGEON STRL SZ 6.5 (GLOVE) ×4 IMPLANT
GLOVE SURG SS PI 7.0 STRL IVOR (GLOVE) ×1 IMPLANT
GOWN STRL REUS W/ TWL LRG LVL3 (GOWN DISPOSABLE) ×2 IMPLANT
GOWN STRL REUS W/TWL LRG LVL3 (GOWN DISPOSABLE) ×6
MATRIX SURGICAL PSM 7X10CM (Tissue) ×1 IMPLANT
MICROMATRIX 1000MG (Tissue) ×2 IMPLANT
NDL PRECISIONGLIDE 27X1.5 (NEEDLE) IMPLANT
NEEDLE PRECISIONGLIDE 27X1.5 (NEEDLE) IMPLANT
NS IRRIG 1000ML POUR BTL (IV SOLUTION) ×2 IMPLANT
PACK BASIN DAY SURGERY FS (CUSTOM PROCEDURE TRAY) ×2 IMPLANT
PENCIL BUTTON HOLSTER BLD 10FT (ELECTRODE) ×2 IMPLANT
SLEEVE SCD COMPRESS KNEE MED (MISCELLANEOUS) ×1 IMPLANT
SOLUTION PARTIC MCRMTRX 1000MG (Tissue) IMPLANT
SPONGE GAUZE 4X4 12PLY STER LF (GAUZE/BANDAGES/DRESSINGS) IMPLANT
SPONGE LAP 18X18 X RAY DECT (DISPOSABLE) ×1 IMPLANT
STAPLER VISISTAT 35W (STAPLE) IMPLANT
SUCTION FRAZIER HANDLE 10FR (MISCELLANEOUS)
SUCTION TUBE FRAZIER 10FR DISP (MISCELLANEOUS) IMPLANT
SURGILUBE 2OZ TUBE FLIPTOP (MISCELLANEOUS) ×1 IMPLANT
SUT MNCRL AB 4-0 PS2 18 (SUTURE) ×2 IMPLANT
SUT VIC AB 3-0 FS2 27 (SUTURE) IMPLANT
SUT VIC AB 5-0 PS2 18 (SUTURE) ×2 IMPLANT
SUT VICRYL 4-0 PS2 18IN ABS (SUTURE) IMPLANT
SYR BULB IRRIGATION 50ML (SYRINGE) ×1 IMPLANT
SYR CONTROL 10ML LL (SYRINGE) IMPLANT
TOWEL OR 17X24 6PK STRL BLUE (TOWEL DISPOSABLE) ×2 IMPLANT
TRAY DSU PREP LF (CUSTOM PROCEDURE TRAY) ×1 IMPLANT
TUBE CONNECTING 20X1/4 (TUBING) ×1 IMPLANT
UNDERPAD 30X30 (UNDERPADS AND DIAPERS) IMPLANT
YANKAUER SUCT BULB TIP NO VENT (SUCTIONS) ×1 IMPLANT

## 2015-12-27 NOTE — Transfer of Care (Signed)
Immediate Anesthesia Transfer of Care Note  Patient: Peter Morgan  Procedure(s) Performed: Procedure(s): A-CELL AND VAC PLACEMENT TO SCALP WOUND (N/A)  Patient Location: PACU  Anesthesia Type:General  Level of Consciousness: awake, alert  and oriented  Airway & Oxygen Therapy: Patient Spontanous Breathing and Patient connected to face mask oxygen  Post-op Assessment: Report given to RN and Post -op Vital signs reviewed and stable  Post vital signs: Reviewed and stable  Last Vitals:  Filed Vitals:   12/27/15 0856  BP: 131/70  Pulse: 87  Temp: 36.8 C  Resp: 20    Complications: No apparent anesthesia complications

## 2015-12-27 NOTE — Discharge Instructions (Signed)
Call Dr Eusebio Friendly office for appt. Scheduling (Jan. 20th)  Keep clean/dry and intact.  Alert office with any problems with pump immediately.     Call your surgeon if you experience:   1.  Fever over 101.0. 2.  Inability to urinate. 3.  Nausea and/or vomiting. 4.  Extreme swelling or bruising at the surgical site. 5.  Continued bleeding from the incision. 6.  Increased pain, redness or drainage from the incision. 7.  Problems related to your pain medication. 8. Any change in color, movement and/or sensation 9. Any problems and/or concerns  Post Anesthesia Home Care Instructions  Activity: Get plenty of rest for the remainder of the day. A responsible adult should stay with you for 24 hours following the procedure.  For the next 24 hours, DO NOT: -Drive a car -Paediatric nurse -Drink alcoholic beverages -Take any medication unless instructed by your physician -Make any legal decisions or sign important papers.  Meals: Start with liquid foods such as gelatin or soup. Progress to regular foods as tolerated. Avoid greasy, spicy, heavy foods. If nausea and/or vomiting occur, drink only clear liquids until the nausea and/or vomiting subsides. Call your physician if vomiting continues.  Special Instructions/Symptoms: Your throat may feel dry or sore from the anesthesia or the breathing tube placed in your throat during surgery. If this causes discomfort, gargle with warm salt water. The discomfort should disappear within 24 hours.  If you had a scopolamine patch placed behind your ear for the management of post- operative nausea and/or vomiting:  1. The medication in the patch is effective for 72 hours, after which it should be removed.  Wrap patch in a tissue and discard in the trash. Wash hands thoroughly with soap and water. 2. You may remove the patch earlier than 72 hours if you experience unpleasant side effects which may include dry mouth, dizziness or visual  disturbances. 3. Avoid touching the patch. Wash your hands with soap and water after contact with the patch.

## 2015-12-27 NOTE — Op Note (Signed)
Operative Note   DATE OF OPERATION: 12/27/2015  LOCATION: [place]  SURGICAL DIVISION: Plastic Surgery  PREOPERATIVE DIAGNOSES:  Scalp wound after mohs resection of basal cell carcinoma  POSTOPERATIVE DIAGNOSES:  same  PROCEDURE:  Preparation of scalp defect for placement of Acell (1 gm and 7 x 10 cm sheet) and VAC 8 x 7 cm  SURGEON: Goldia Ligman Sanger, DO  ASSISTANT: Shawn Rayburn, PA  ANESTHESIA:  General.   COMPLICATIONS: None.   INDICATIONS FOR PROCEDURE:  The patient, Peter Morgan is a 70 y.o. male born on Aug 12, 1946, is here for treatment of a scalp defect after excision of a basal cell carcinoma removed via mohs technique. MRN: [id number]  CONSENT:  Informed consent was obtained directly from the patient. Risks, benefits and alternatives were fully discussed. Specific risks including but not limited to bleeding, infection, hematoma, seroma, scarring, pain, infection, contracture, asymmetry, wound healing problems, and need for further surgery were all discussed. The patient did have an ample opportunity to have questions answered to satisfaction.   DESCRIPTION OF PROCEDURE:  The patient was taken to the operating room. SCDs were placed and IV antibiotics were given. The patient's operative site was prepped and draped in a sterile fashion. A time out was performed and all information was confirmed to be correct.  General anesthesia was administered.  The area was irrigated with saline and antibiotic solution.  The bovie was used for hemostasis.  The Acell 7 x 10 cm sheet and 1 gm of powder was placed on the wound and secured with 5-0 Vicryl.  An adaptic was placed over the area and secured with 4-0 Silk. The VAC was applied and there was an excellent seal.  The patient tolerated the procedure well.  There were no complications. The patient was allowed to wake from anesthesia, extubated and taken to the recovery room in satisfactory condition.

## 2015-12-27 NOTE — Anesthesia Postprocedure Evaluation (Signed)
Anesthesia Post Note  Patient: Peter Morgan  Procedure(s) Performed: Procedure(s) (LRB): A-CELL AND VAC PLACEMENT TO SCALP WOUND (N/A)  Patient location during evaluation: PACU Anesthesia Type: General Level of consciousness: awake, oriented and patient cooperative Pain management: pain level controlled Vital Signs Assessment: post-procedure vital signs reviewed and stable Respiratory status: spontaneous breathing and respiratory function stable Cardiovascular status: blood pressure returned to baseline Anesthetic complications: no    Last Vitals:  Filed Vitals:   12/27/15 1130 12/27/15 1143  BP: 139/85   Pulse: 89 88  Temp: 36.9 C   Resp: 9 10    Last Pain:  Filed Vitals:   12/27/15 1145  PainSc: 7                  Yezenia Fredrick EDWARD

## 2015-12-27 NOTE — Brief Op Note (Signed)
12/27/2015  11:04 AM  PATIENT:  Peter Morgan  70 y.o. male  PRE-OPERATIVE DIAGNOSIS:  basal cell carcinoma of the scalp  POST-OPERATIVE DIAGNOSIS:  basal cell carcinoma of the scalp  PROCEDURE:  Procedure(s): A-CELL AND VAC PLACEMENT TO SCALP WOUND (N/A)  SURGEON:  Surgeon(s) and Role:    * Loel Lofty Dillingham, DO - Primary  PHYSICIAN ASSISTANT: Shawn Rayburn, PA  ASSISTANTS: none   ANESTHESIA:   general  EBL:  Total I/O In: 1000 [I.V.:1000] Out: -   BLOOD ADMINISTERED:none  DRAINS: none   LOCAL MEDICATIONS USED:  NONE  SPECIMEN:  No Specimen  DISPOSITION OF SPECIMEN:  N/A  COUNTS:  YES  TOURNIQUET:  * No tourniquets in log *  DICTATION: .Dragon Dictation  PLAN OF CARE: Discharge to home after PACU  PATIENT DISPOSITION:  PACU - hemodynamically stable.   Delay start of Pharmacological VTE agent (>24hrs) due to surgical blood loss or risk of bleeding: no

## 2015-12-27 NOTE — Anesthesia Procedure Notes (Signed)
Procedure Name: LMA Insertion Date/Time: 12/27/2015 10:14 AM Performed by: Melynda Ripple D Pre-anesthesia Checklist: Patient identified, Emergency Drugs available, Suction available and Patient being monitored Patient Re-evaluated:Patient Re-evaluated prior to inductionOxygen Delivery Method: Circle System Utilized Preoxygenation: Pre-oxygenation with 100% oxygen Intubation Type: IV induction Ventilation: Mask ventilation without difficulty LMA: LMA inserted LMA Size: 5.0 Number of attempts: 1 Airway Equipment and Method: Bite block Placement Confirmation: positive ETCO2 Tube secured with: Tape Dental Injury: Teeth and Oropharynx as per pre-operative assessment

## 2015-12-27 NOTE — H&P (Signed)
Peter Morgan is an 70 y.o. male.   Chief Complaint: scalp wound HPI: The patient is a 70 yrs old wm here with his wife for treatment his scalp wound. He underwent Mohs excision of a superficial and nodular Basal cell carcinoma this week. He has negative margins now. The bone is exposed. The wound is ~ 5 x 6 cm. He has hair on the sides and back of his head but none on the top. The area is clean. He is taking a blood thinner due to a cardiac stent placement several years ago. I spoke with his cardiologist who states he can come off the anticoagulation without being bridged with lovenox. The Cardiologist also wants to see him in the office. 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 He is very active and wants to be able to help her with shows she will be doing in the coming months.  Past Medical History  Diagnosis Date  . Hyperlipidemia   . BPH (benign prostatic hyperplasia)   . Arthritis   . CAD (coronary artery disease)     Diagnosed 12/2011 following abnormal treadmill test - high grade OM stenosis s/p DES 01/05/12, mild residual non-obstructive LAD, Lcx, RCA disease.  Normal LV function with EF 55-65% by cath 01/05/12  . Hypertension   . GERD (gastroesophageal reflux disease)   . PONV (postoperative nausea and vomiting)   . Anxiety   . Panic attack     Past Surgical History  Procedure Laterality Date  . Knee arthroscopy      bilateral  . Appendectomy    . Tonsillectomy and adenoidectomy    . Nasal septum surgery    . Femur surgery    . Coronary stents     . Right middle finger amputation     . Total knee arthroplasty Left 07/11/2013    Procedure: LEFT TOTAL KNEE ARTHROPLASTY ;  Surgeon: Gearlean Alf, MD;  Location: [facility];  Service: Orthopedics;  Laterality: Left;  . Total knee arthroplasty Right 07/10/2014    Procedure: RIGHT TOTAL KNEE ARTHROPLASTY;  Surgeon: Gearlean Alf, MD;  Location: [facility];  Service: Orthopedics;  Laterality: Right;  . Left heart catheterization  with coronary angiogram N/A 01/05/2012    Procedure: LEFT HEART CATHETERIZATION WITH CORONARY ANGIOGRAM;  Surgeon: Minus Breeding, MD;  Location: [facility] CATH LAB;  Service: Cardiovascular;  Laterality: N/A;  . Joint replacement    . Mohs surgery      basal cell on scalp    Family History  Problem Relation Age of Onset  . Coronary artery disease Father     "Hardening of the arteries"   Social History:  reports that he has never smoked. He has never used smokeless tobacco. He reports that he drinks about 8.4 oz of alcohol per week. He reports that he does not use illicit drugs.  Allergies:  Allergies  Allergen Reactions  . Codeine Nausea And Vomiting  . Oxycodone Nausea Only  . Tramadol Nausea And Vomiting    Medications Prior to Admission  Medication Sig Dispense Refill  . acetaminophen (TYLENOL) 500 MG tablet Take 1,000 mg by mouth every 4 (four) hours as needed for pain. Pain    . ALPRAZolam (XANAX) 0.5 MG tablet Take 0.5 mg by mouth at bedtime.     Marland Kitchen aspirin 81 MG tablet Take 81 mg by mouth daily.    Marland Kitchen atorvastatin (LIPITOR) 20 MG tablet Take 20 mg by mouth every morning.    Marland Kitchen  clopidogrel (PLAVIX) 75 MG tablet Take 75 mg by mouth daily.    . metoprolol succinate (TOPROL-XL) 50 MG 24 hr tablet Take 25 mg by mouth every morning. Take with or immediately following a meal.    . pantoprazole (PROTONIX) 40 MG tablet Take 40 mg by mouth daily.    . tamsulosin (FLOMAX) 0.4 MG CAPS Take 0.4 mg by mouth 2 (two) times daily.     . nitroGLYCERIN (NITROSTAT) 0.4 MG SL tablet Place 0.4 mg under the tongue every 5 (five) minutes as needed for chest pain.      No results found for this or any previous visit (from the past 48 hour(s)). No results found.  Review of Systems  Constitutional: Negative.   HENT: Negative.   Eyes: Negative.   Respiratory: Negative.   Cardiovascular: Negative.   Gastrointestinal: Negative.   Genitourinary: Negative.   Musculoskeletal: Negative.   Skin: Negative.    Neurological: Negative.   Psychiatric/Behavioral: Negative.     Blood pressure 131/70, pulse 87, temperature 98.2 F (36.8 C), temperature source Oral, resp. rate 20, height 5' 9.5" (1.765 m), weight 81.194 kg (179 lb), SpO2 100 %. Physical Exam  Constitutional: He is oriented to person, place, and time. He appears well-developed and well-nourished.  HENT:  Head: Normocephalic and atraumatic.  Eyes: Conjunctivae and EOM are normal. Pupils are equal, round, and reactive to light.  Cardiovascular: Normal rate.   Respiratory: Effort normal.  GI: Soft.  Neurological: He is alert and oriented to person, place, and time.  Skin: Skin is warm.  Psychiatric: He has a normal mood and affect. His behavior is normal. Judgment and thought content normal.     Assessment/Plan Plan for debridement of scalp wound and placement of Acell and VAC.  Peter Morgan 12/27/2015, 9:48 AM

## 2015-12-27 NOTE — Anesthesia Preprocedure Evaluation (Signed)
Anesthesia Evaluation  Patient identified by MRN, date of birth, ID band Patient awake    Reviewed: Allergy & Precautions, NPO status , Patient's Chart, lab work & pertinent test results  History of Anesthesia Complications (+) PONV  Airway Mallampati: I  TM Distance: >3 FB     Dental   Pulmonary    Pulmonary exam normal        Cardiovascular hypertension, + CAD and + Cardiac Stents  Normal cardiovascular exam     Neuro/Psych    GI/Hepatic GERD  ,  Endo/Other    Renal/GU      Musculoskeletal  (+) Arthritis ,   Abdominal   Peds  Hematology   Anesthesia Other Findings   Reproductive/Obstetrics                             Anesthesia Physical Anesthesia Plan  ASA: III  Anesthesia Plan: General   Post-op Pain Management:    Induction: Intravenous  Airway Management Planned: LMA  Additional Equipment:   Intra-op Plan:   Post-operative Plan: Extubation in OR  Informed Consent: I have reviewed the patients History and Physical, chart, labs and discussed the procedure including the risks, benefits and alternatives for the proposed anesthesia with the patient or authorized representative who has indicated his/her understanding and acceptance.     Plan Discussed with: CRNA, Anesthesiologist and Surgeon  Anesthesia Plan Comments:         Anesthesia Quick Evaluation

## 2015-12-28 ENCOUNTER — Encounter (HOSPITAL_BASED_OUTPATIENT_CLINIC_OR_DEPARTMENT_OTHER): Payer: Self-pay | Admitting: Plastic Surgery

## 2015-12-28 ENCOUNTER — Telehealth: Payer: Self-pay | Admitting: Cardiology

## 2015-12-28 NOTE — Telephone Encounter (Signed)
Pt called stating that he just had some surgery done and he would like to know if he can begin taking his Plavix and Aspirin again

## 2015-12-28 NOTE — Telephone Encounter (Signed)
Pt called stating that he just had some surgery done and he would like to know if he can begin taking his Plavix and Aspirin again. Please f/u with pt  Thanks

## 2015-12-28 NOTE — Telephone Encounter (Signed)
If it is OK with the surgeon (i.e. They don't think the wound will bleed) he can start taking the ASA.  I probably wont be restarting the Plavix.

## 2015-12-28 NOTE — Telephone Encounter (Signed)
Pt called stating that he just had some surgery done and he would like to know if he can begin taking his Plavix and Aspirin again   Treatment of a scalp defect after excision of a basal cell carcinoma removed via mohs technique.  Deferring to [place], DO

## 2015-12-31 DIAGNOSIS — L821 Other seborrheic keratosis: Secondary | ICD-10-CM | POA: Diagnosis not present

## 2015-12-31 DIAGNOSIS — L570 Actinic keratosis: Secondary | ICD-10-CM | POA: Diagnosis not present

## 2015-12-31 NOTE — Telephone Encounter (Signed)
Minus Breeding, MD at 12/28/2015 5:22 PM     Status: Signed        If it is OK with the surgeon (i.e. They don't think the wound will bleed) he can start taking the ASA. I probably wont be restarting the Plavix.        Called patient and informed him that is it is ok with surgeon he can start the ASA. Dr. Percival Spanish is not restarting the Plavix.  Routing to [place]

## 2015-12-31 NOTE — Telephone Encounter (Signed)
Can this encounter be closed?

## 2016-01-08 DIAGNOSIS — M952 Other acquired deformity of head: Secondary | ICD-10-CM | POA: Diagnosis not present

## 2016-01-08 DIAGNOSIS — C4441 Basal cell carcinoma of skin of scalp and neck: Secondary | ICD-10-CM | POA: Diagnosis not present

## 2016-01-14 DIAGNOSIS — M952 Other acquired deformity of head: Secondary | ICD-10-CM | POA: Diagnosis not present

## 2016-01-14 DIAGNOSIS — C4441 Basal cell carcinoma of skin of scalp and neck: Secondary | ICD-10-CM | POA: Diagnosis not present

## 2016-01-21 ENCOUNTER — Encounter (HOSPITAL_BASED_OUTPATIENT_CLINIC_OR_DEPARTMENT_OTHER): Payer: Self-pay | Admitting: *Deleted

## 2016-01-21 DIAGNOSIS — M952 Other acquired deformity of head: Secondary | ICD-10-CM | POA: Diagnosis not present

## 2016-01-21 DIAGNOSIS — C4441 Basal cell carcinoma of skin of scalp and neck: Secondary | ICD-10-CM | POA: Diagnosis not present

## 2016-01-21 DIAGNOSIS — R51 Headache: Secondary | ICD-10-CM | POA: Diagnosis not present

## 2016-01-22 ENCOUNTER — Other Ambulatory Visit: Payer: Self-pay | Admitting: Plastic Surgery

## 2016-01-22 DIAGNOSIS — S0100XA Unspecified open wound of scalp, initial encounter: Secondary | ICD-10-CM

## 2016-01-23 ENCOUNTER — Encounter (HOSPITAL_BASED_OUTPATIENT_CLINIC_OR_DEPARTMENT_OTHER): Payer: Self-pay | Admitting: Plastic Surgery

## 2016-01-23 ENCOUNTER — Other Ambulatory Visit: Payer: Self-pay | Admitting: Plastic Surgery

## 2016-01-23 NOTE — H&P (View-Only) (Signed)
Peter Morgan is an 70 y.o. male.   Chief Complaint: scalp wound HPI: The patient is a 70 yrs old wm here with his wife for treatment his scalp wound. He underwent Mohs excision of a superficial and nodular Basal cell carcinoma this week. He has negative margins now. The bone is exposed. The wound is ~ 5 x 6 cm. He has hair on the sides and back of his head but none on the top. The area is clean. He is taking a blood thinner due to a cardiac stent placement several years ago. I spoke with his cardiologist who states he can come off the anticoagulation without being bridged with lovenox. The Cardiologist also wants to see him in the office. 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 He is very active and wants to be able to help her with shows she will be doing in the coming months.  Past Medical History  Diagnosis Date  . Hyperlipidemia   . BPH (benign prostatic hyperplasia)   . Arthritis   . CAD (coronary artery disease)     Diagnosed 12/2011 following abnormal treadmill test - high grade OM stenosis s/p DES 01/05/12, mild residual non-obstructive LAD, Lcx, RCA disease.  Normal LV function with EF 55-65% by cath 01/05/12  . Hypertension   . GERD (gastroesophageal reflux disease)   . PONV (postoperative nausea and vomiting)   . Anxiety   . Panic attack     Past Surgical History  Procedure Laterality Date  . Knee arthroscopy      bilateral  . Appendectomy    . Tonsillectomy and adenoidectomy    . Nasal septum surgery    . Femur surgery    . Coronary stents     . Right middle finger amputation     . Total knee arthroplasty Left 07/11/2013    Procedure: LEFT TOTAL KNEE ARTHROPLASTY ;  Surgeon: Gearlean Alf, MD;  Location: [facility];  Service: Orthopedics;  Laterality: Left;  . Total knee arthroplasty Right 07/10/2014    Procedure: RIGHT TOTAL KNEE ARTHROPLASTY;  Surgeon: Gearlean Alf, MD;  Location: [facility];  Service: Orthopedics;  Laterality: Right;  . Left heart catheterization  with coronary angiogram N/A 01/05/2012    Procedure: LEFT HEART CATHETERIZATION WITH CORONARY ANGIOGRAM;  Surgeon: Minus Breeding, MD;  Location: [facility] CATH LAB;  Service: Cardiovascular;  Laterality: N/A;  . Joint replacement    . Mohs surgery      basal cell on scalp    Family History  Problem Relation Age of Onset  . Coronary artery disease Father     "Hardening of the arteries"   Social History:  reports that he has never smoked. He has never used smokeless tobacco. He reports that he drinks about 8.4 oz of alcohol per week. He reports that he does not use illicit drugs.  Allergies:  Allergies  Allergen Reactions  . Codeine Nausea And Vomiting  . Oxycodone Nausea Only  . Tramadol Nausea And Vomiting    Medications Prior to Admission  Medication Sig Dispense Refill  . acetaminophen (TYLENOL) 500 MG tablet Take 1,000 mg by mouth every 4 (four) hours as needed for pain. Pain    . ALPRAZolam (XANAX) 0.5 MG tablet Take 0.5 mg by mouth at bedtime.     Marland Kitchen aspirin 81 MG tablet Take 81 mg by mouth daily.    Marland Kitchen atorvastatin (LIPITOR) 20 MG tablet Take 20 mg by mouth every morning.    Marland Kitchen  clopidogrel (PLAVIX) 75 MG tablet Take 75 mg by mouth daily.    . metoprolol succinate (TOPROL-XL) 50 MG 24 hr tablet Take 25 mg by mouth every morning. Take with or immediately following a meal.    . pantoprazole (PROTONIX) 40 MG tablet Take 40 mg by mouth daily.    . tamsulosin (FLOMAX) 0.4 MG CAPS Take 0.4 mg by mouth 2 (two) times daily.     . nitroGLYCERIN (NITROSTAT) 0.4 MG SL tablet Place 0.4 mg under the tongue every 5 (five) minutes as needed for chest pain.      No results found for this or any previous visit (from the past 48 hour(s)). No results found.  Review of Systems  Constitutional: Negative.   HENT: Negative.   Eyes: Negative.   Respiratory: Negative.   Cardiovascular: Negative.   Gastrointestinal: Negative.   Genitourinary: Negative.   Musculoskeletal: Negative.   Skin: Negative.    Neurological: Negative.   Psychiatric/Behavioral: Negative.     Blood pressure 131/70, pulse 87, temperature 98.2 F (36.8 C), temperature source Oral, resp. rate 20, height 5' 9.5" (1.765 m), weight 81.194 kg (179 lb), SpO2 100 %. Physical Exam  Constitutional: He is oriented to person, place, and time. He appears well-developed and well-nourished.  HENT:  Head: Normocephalic and atraumatic.  Eyes: Conjunctivae and EOM are normal. Pupils are equal, round, and reactive to light.  Cardiovascular: Normal rate.   Respiratory: Effort normal.  GI: Soft.  Neurological: He is alert and oriented to person, place, and time.  Skin: Skin is warm.  Psychiatric: He has a normal mood and affect. His behavior is normal. Judgment and thought content normal.     Assessment/Plan Plan for debridement of scalp wound and placement of Acell and VAC.  Wallace Going 12/27/2015, 9:48 AM

## 2016-01-23 NOTE — Interval H&P Note (Signed)
History and Physical Interval Note:  01/23/2016 11:20 AM  Peter Morgan  has presented today for surgery, with the diagnosis of BASAL CELL CARCINOMA SCALP  The various methods of treatment have been discussed with the patient and family. After consideration of risks, benefits and other options for treatment, the patient has consented to  Procedure(s): SPLIT THICKNESS SKIN GRAFT TO SCALP WITH  (N/A) APPLICATION OF WOUND VAC (N/A) APPLICATION OF A-CELL (N/A) as a surgical intervention .  The patient's history has been reviewed, patient examined, no change in status, stable for surgery.  I have reviewed the patient's chart and labs.  Questions were answered to the patient's satisfaction.     Wallace Going

## 2016-01-24 ENCOUNTER — Encounter (HOSPITAL_BASED_OUTPATIENT_CLINIC_OR_DEPARTMENT_OTHER)
Admission: RE | Disposition: A | Discharge status: Home / Self Care | Payer: Self-pay | Source: Ambulatory Visit | Attending: Plastic Surgery

## 2016-01-24 ENCOUNTER — Ambulatory Visit (HOSPITAL_BASED_OUTPATIENT_CLINIC_OR_DEPARTMENT_OTHER): Payer: Medicare Other | Admitting: Anesthesiology

## 2016-01-24 ENCOUNTER — Encounter (HOSPITAL_BASED_OUTPATIENT_CLINIC_OR_DEPARTMENT_OTHER): Payer: Self-pay | Admitting: Plastic Surgery

## 2016-01-24 ENCOUNTER — Ambulatory Visit (HOSPITAL_BASED_OUTPATIENT_CLINIC_OR_DEPARTMENT_OTHER)
Admission: RE | Admit: 2016-01-24 | Discharge: 2016-01-24 | Disposition: A | Discharge status: Home / Self Care | Payer: Medicare Other | Source: Ambulatory Visit | Attending: Plastic Surgery | Admitting: Plastic Surgery

## 2016-01-24 DIAGNOSIS — Z96652 Presence of left artificial knee joint: Secondary | ICD-10-CM | POA: Insufficient documentation

## 2016-01-24 DIAGNOSIS — M1990 Unspecified osteoarthritis, unspecified site: Secondary | ICD-10-CM | POA: Diagnosis not present

## 2016-01-24 DIAGNOSIS — Z481 Encounter for planned postprocedural wound closure: Secondary | ICD-10-CM | POA: Diagnosis not present

## 2016-01-24 DIAGNOSIS — I10 Essential (primary) hypertension: Secondary | ICD-10-CM | POA: Insufficient documentation

## 2016-01-24 DIAGNOSIS — Z886 Allergy status to analgesic agent status: Secondary | ICD-10-CM | POA: Insufficient documentation

## 2016-01-24 DIAGNOSIS — Z951 Presence of aortocoronary bypass graft: Secondary | ICD-10-CM | POA: Diagnosis not present

## 2016-01-24 DIAGNOSIS — Z885 Allergy status to narcotic agent status: Secondary | ICD-10-CM | POA: Diagnosis not present

## 2016-01-24 DIAGNOSIS — K219 Gastro-esophageal reflux disease without esophagitis: Secondary | ICD-10-CM | POA: Diagnosis not present

## 2016-01-24 DIAGNOSIS — C4441 Basal cell carcinoma of skin of scalp and neck: Secondary | ICD-10-CM | POA: Diagnosis not present

## 2016-01-24 DIAGNOSIS — Z7902 Long term (current) use of antithrombotics/antiplatelets: Secondary | ICD-10-CM | POA: Insufficient documentation

## 2016-01-24 DIAGNOSIS — F410 Panic disorder [episodic paroxysmal anxiety] without agoraphobia: Secondary | ICD-10-CM | POA: Diagnosis not present

## 2016-01-24 DIAGNOSIS — I2510 Atherosclerotic heart disease of native coronary artery without angina pectoris: Secondary | ICD-10-CM | POA: Insufficient documentation

## 2016-01-24 DIAGNOSIS — Z79899 Other long term (current) drug therapy: Secondary | ICD-10-CM | POA: Insufficient documentation

## 2016-01-24 DIAGNOSIS — Z7982 Long term (current) use of aspirin: Secondary | ICD-10-CM | POA: Diagnosis not present

## 2016-01-24 DIAGNOSIS — S0100XA Unspecified open wound of scalp, initial encounter: Secondary | ICD-10-CM

## 2016-01-24 DIAGNOSIS — E785 Hyperlipidemia, unspecified: Secondary | ICD-10-CM | POA: Diagnosis not present

## 2016-01-24 DIAGNOSIS — Z85828 Personal history of other malignant neoplasm of skin: Secondary | ICD-10-CM | POA: Diagnosis not present

## 2016-01-24 DIAGNOSIS — N400 Enlarged prostate without lower urinary tract symptoms: Secondary | ICD-10-CM | POA: Insufficient documentation

## 2016-01-24 DIAGNOSIS — M952 Other acquired deformity of head: Secondary | ICD-10-CM | POA: Diagnosis not present

## 2016-01-24 HISTORY — PX: APPLICATION OF A-CELL OF HEAD/NECK: SHX6304

## 2016-01-24 HISTORY — PX: SKIN SPLIT GRAFT: SHX444

## 2016-01-24 HISTORY — PX: APPLICATION OF WOUND VAC: SHX5189

## 2016-01-24 SURGERY — APPLICATION, GRAFT, SKIN, SPLIT-THICKNESS
Anesthesia: General | Site: Scalp

## 2016-01-24 MED ORDER — BUPIVACAINE-EPINEPHRINE (PF) 0.25% -1:200000 IJ SOLN
INTRAMUSCULAR | Status: AC
  Filled 2016-01-24: qty 30

## 2016-01-24 MED ORDER — DEXAMETHASONE SODIUM PHOSPHATE 4 MG/ML IJ SOLN
INTRAMUSCULAR | Status: DC | PRN
  Administered 2016-01-24: 10 mg via INTRAVENOUS

## 2016-01-24 MED ORDER — ATROPINE SULFATE 0.4 MG/ML IJ SOLN
INTRAMUSCULAR | Status: AC
  Filled 2016-01-24: qty 1

## 2016-01-24 MED ORDER — FENTANYL CITRATE (PF) 100 MCG/2ML IJ SOLN
INTRAMUSCULAR | Status: AC
  Filled 2016-01-24: qty 2

## 2016-01-24 MED ORDER — LIDOCAINE-EPINEPHRINE 1 %-1:100000 IJ SOLN
INTRAMUSCULAR | Status: AC
  Filled 2016-01-24: qty 1

## 2016-01-24 MED ORDER — LACTATED RINGERS IV SOLN
INTRAVENOUS | Status: DC
  Administered 2016-01-24 (×2): via INTRAVENOUS

## 2016-01-24 MED ORDER — SCOPOLAMINE 1 MG/3DAYS TD PT72
1.0000 | MEDICATED_PATCH | Freq: Once | TRANSDERMAL | Status: AC | PRN
  Administered 2016-01-24: 1 via TRANSDERMAL

## 2016-01-24 MED ORDER — FENTANYL CITRATE (PF) 100 MCG/2ML IJ SOLN
50.0000 ug | INTRAMUSCULAR | Status: DC | PRN
  Administered 2016-01-24: 100 ug via INTRAVENOUS

## 2016-01-24 MED ORDER — LIDOCAINE HCL (CARDIAC) 20 MG/ML IV SOLN
INTRAVENOUS | Status: DC | PRN
  Administered 2016-01-24: 75 mg via INTRAVENOUS

## 2016-01-24 MED ORDER — LIDOCAINE HCL (CARDIAC) 20 MG/ML IV SOLN
INTRAVENOUS | Status: AC
  Filled 2016-01-24: qty 5

## 2016-01-24 MED ORDER — CEFAZOLIN SODIUM-DEXTROSE 2-3 GM-% IV SOLR
INTRAVENOUS | Status: AC
  Filled 2016-01-24: qty 50

## 2016-01-24 MED ORDER — LIDOCAINE HCL (PF) 1 % IJ SOLN
INTRAMUSCULAR | Status: AC
  Filled 2016-01-24: qty 30

## 2016-01-24 MED ORDER — DEXAMETHASONE SODIUM PHOSPHATE 10 MG/ML IJ SOLN
INTRAMUSCULAR | Status: AC
  Filled 2016-01-24: qty 1

## 2016-01-24 MED ORDER — BACITRACIN-NEOMYCIN-POLYMYXIN 400-5-5000 EX OINT
TOPICAL_OINTMENT | CUTANEOUS | Status: AC
  Filled 2016-01-24: qty 1

## 2016-01-24 MED ORDER — SUCCINYLCHOLINE CHLORIDE 20 MG/ML IJ SOLN
INTRAMUSCULAR | Status: AC
  Filled 2016-01-24: qty 1

## 2016-01-24 MED ORDER — EPHEDRINE SULFATE 50 MG/ML IJ SOLN
INTRAMUSCULAR | Status: AC
  Filled 2016-01-24: qty 1

## 2016-01-24 MED ORDER — ACETAMINOPHEN 325 MG PO TABS
ORAL_TABLET | ORAL | Status: AC
  Filled 2016-01-24: qty 3

## 2016-01-24 MED ORDER — ACETAMINOPHEN 500 MG PO TABS
1000.0000 mg | ORAL_TABLET | Freq: Once | ORAL | Status: AC
  Administered 2016-01-24: 1000 mg via ORAL

## 2016-01-24 MED ORDER — CEFAZOLIN SODIUM-DEXTROSE 2-3 GM-% IV SOLR
2.0000 g | INTRAVENOUS | Status: AC
  Administered 2016-01-24 (×2): 2 g via INTRAVENOUS

## 2016-01-24 MED ORDER — PHENYLEPHRINE HCL 10 MG/ML IJ SOLN
10.0000 mg | INTRAVENOUS | Status: DC | PRN
  Administered 2016-01-24: 40 ug/min via INTRAVENOUS

## 2016-01-24 MED ORDER — PROPOFOL 10 MG/ML IV BOLUS
INTRAVENOUS | Status: DC | PRN
  Administered 2016-01-24: 200 mg via INTRAVENOUS

## 2016-01-24 MED ORDER — MIDAZOLAM HCL 2 MG/2ML IJ SOLN: 1.0000 mg | INTRAMUSCULAR | Status: DC | PRN

## 2016-01-24 MED ORDER — EPHEDRINE SULFATE 50 MG/ML IJ SOLN
INTRAMUSCULAR | Status: DC | PRN
  Administered 2016-01-24 (×2): 10 mg via INTRAVENOUS

## 2016-01-24 MED ORDER — ONDANSETRON HCL 4 MG/2ML IJ SOLN
INTRAMUSCULAR | Status: DC | PRN
  Administered 2016-01-24: 4 mg via INTRAVENOUS

## 2016-01-24 MED ORDER — BUPIVACAINE-EPINEPHRINE 0.25% -1:200000 IJ SOLN
INTRAMUSCULAR | Status: DC | PRN
  Administered 2016-01-24: 14 mL

## 2016-01-24 MED ORDER — BUPIVACAINE HCL (PF) 0.25 % IJ SOLN
INTRAMUSCULAR | Status: AC
  Filled 2016-01-24: qty 30

## 2016-01-24 MED ORDER — SODIUM CHLORIDE 0.9 % IR SOLN
Status: DC | PRN
  Administered 2016-01-24: 500 mL

## 2016-01-24 MED ORDER — PROPOFOL 500 MG/50ML IV EMUL
INTRAVENOUS | Status: AC
  Filled 2016-01-24: qty 50

## 2016-01-24 MED ORDER — PHENYLEPHRINE 40 MCG/ML (10ML) SYRINGE FOR IV PUSH (FOR BLOOD PRESSURE SUPPORT)
PREFILLED_SYRINGE | INTRAVENOUS | Status: AC
Start: 2016-01-24 — End: 2016-01-24
  Filled 2016-01-24: qty 10

## 2016-01-24 MED ORDER — PROMETHAZINE HCL 25 MG/ML IJ SOLN: 6.2500 mg | INTRAMUSCULAR | Status: DC | PRN

## 2016-01-24 MED ORDER — HYDROMORPHONE HCL 1 MG/ML IJ SOLN: 0.2500 mg | INTRAMUSCULAR | Status: DC | PRN

## 2016-01-24 MED ORDER — PROPOFOL 10 MG/ML IV BOLUS
INTRAVENOUS | Status: AC
  Filled 2016-01-24: qty 20

## 2016-01-24 MED ORDER — ONDANSETRON HCL 4 MG/2ML IJ SOLN
INTRAMUSCULAR | Status: AC
  Filled 2016-01-24: qty 2

## 2016-01-24 MED ORDER — GLYCOPYRROLATE 0.2 MG/ML IJ SOLN: 0.2000 mg | Freq: Once | INTRAMUSCULAR | Status: DC | PRN

## 2016-01-24 SURGICAL SUPPLY — 95 items
ADH SKN CLS APL DERMABOND .7 (GAUZE/BANDAGES/DRESSINGS)
APL SKNCLS STERI-STRIP NONHPOA (GAUZE/BANDAGES/DRESSINGS)
BAG DECANTER FOR FLEXI CONT (MISCELLANEOUS) ×1 IMPLANT
BALL CTTN LRG ABS STRL LF (GAUZE/BANDAGES/DRESSINGS)
BANDAGE ACE 3X5.8 VEL STRL LF (GAUZE/BANDAGES/DRESSINGS) IMPLANT
BANDAGE ACE 4X5 VEL STRL LF (GAUZE/BANDAGES/DRESSINGS) ×2 IMPLANT
BANDAGE ACE 6X5 VEL STRL LF (GAUZE/BANDAGES/DRESSINGS) IMPLANT
BENZOIN TINCTURE PRP APPL 2/3 (GAUZE/BANDAGES/DRESSINGS) IMPLANT
BLADE CLIPPER SURG (BLADE) ×1 IMPLANT
BLADE DERMATOME SS (BLADE) ×1 IMPLANT
BLADE HEX COATED 2.75 (ELECTRODE) ×1 IMPLANT
BLADE MINI RND TIP GREEN BEAV (BLADE) IMPLANT
BLADE SURG 10 STRL SS (BLADE) IMPLANT
BLADE SURG 15 STRL LF DISP TIS (BLADE) ×2 IMPLANT
BLADE SURG 15 STRL SS (BLADE) ×3
BNDG COHESIVE 4X5 TAN STRL (GAUZE/BANDAGES/DRESSINGS) IMPLANT
BNDG GAUZE ELAST 4 BULKY (GAUZE/BANDAGES/DRESSINGS) ×2 IMPLANT
BRIEF STRETCH FOR OB PAD LRG (UNDERPADS AND DIAPERS) IMPLANT
CANISTER SUCT 1200ML W/VALVE (MISCELLANEOUS) IMPLANT
CHLORAPREP W/TINT 26ML (MISCELLANEOUS) IMPLANT
CORDS BIPOLAR (ELECTRODE) IMPLANT
COTTONBALL LRG STERILE PKG (GAUZE/BANDAGES/DRESSINGS) IMPLANT
COVER BACK TABLE 60X90IN (DRAPES) ×3 IMPLANT
COVER MAYO STAND STRL (DRAPES) ×3 IMPLANT
DECANTER SPIKE VIAL GLASS SM (MISCELLANEOUS) IMPLANT
DERMABOND ADVANCED (GAUZE/BANDAGES/DRESSINGS)
DERMABOND ADVANCED .7 DNX12 (GAUZE/BANDAGES/DRESSINGS) IMPLANT
DERMACARRIERS GRAFT 1 TO 1.5 (DISPOSABLE)
DRAIN PENROSE 1/2X12 LTX STRL (WOUND CARE) IMPLANT
DRAPE INCISE IOBAN 66X45 STRL (DRAPES) ×1 IMPLANT
DRAPE U-SHAPE 76X120 STRL (DRAPES) ×3 IMPLANT
DRESSING DUODERM 4X4 STERILE (GAUZE/BANDAGES/DRESSINGS) IMPLANT
DRSG ADAPTIC 3X8 NADH LF (GAUZE/BANDAGES/DRESSINGS) ×1 IMPLANT
DRSG EMULSION OIL 3X3 NADH (GAUZE/BANDAGES/DRESSINGS) IMPLANT
DRSG OPSITE 6X11 MED (GAUZE/BANDAGES/DRESSINGS) IMPLANT
DRSG PAD ABDOMINAL 8X10 ST (GAUZE/BANDAGES/DRESSINGS) ×1 IMPLANT
DRSG TEGADERM 4X10 (GAUZE/BANDAGES/DRESSINGS) IMPLANT
ELECT NDL TIP 2.8 STRL (NEEDLE) IMPLANT
ELECT NEEDLE TIP 2.8 STRL (NEEDLE) IMPLANT
ELECT REM PT RETURN 9FT ADLT (ELECTROSURGICAL) ×3
ELECTRODE REM PT RTRN 9FT ADLT (ELECTROSURGICAL) ×2 IMPLANT
GAUZE SPONGE 4X4 12PLY STRL (GAUZE/BANDAGES/DRESSINGS) ×3 IMPLANT
GAUZE XEROFORM 5X9 LF (GAUZE/BANDAGES/DRESSINGS) IMPLANT
GLOVE BIO SURGEON STRL SZ 6.5 (GLOVE) ×7 IMPLANT
GLOVE SURG SS PI 7.0 STRL IVOR (GLOVE) ×1 IMPLANT
GOWN STRL REUS W/ TWL LRG LVL3 (GOWN DISPOSABLE) ×4 IMPLANT
GOWN STRL REUS W/TWL LRG LVL3 (GOWN DISPOSABLE) ×6
GRAFT DERMACARRIERS 1 TO 1.5 (DISPOSABLE) IMPLANT
LIQUID BAND (GAUZE/BANDAGES/DRESSINGS) IMPLANT
MATRIX SURGICAL PSM 7X10CM (Tissue) ×1 IMPLANT
NDL HYPO 25X1 1.5 SAFETY (NEEDLE) ×2 IMPLANT
NDL PRECISIONGLIDE 27X1.5 (NEEDLE) IMPLANT
NEEDLE HYPO 25X1 1.5 SAFETY (NEEDLE) ×3 IMPLANT
NEEDLE PRECISIONGLIDE 27X1.5 (NEEDLE) IMPLANT
NS IRRIG 1000ML POUR BTL (IV SOLUTION) ×3 IMPLANT
PACK BASIN DAY SURGERY FS (CUSTOM PROCEDURE TRAY) ×3 IMPLANT
PADDING CAST ABS 3INX4YD NS (CAST SUPPLIES)
PADDING CAST ABS 4INX4YD NS (CAST SUPPLIES)
PADDING CAST ABS COTTON 3X4 (CAST SUPPLIES) IMPLANT
PADDING CAST ABS COTTON 4X4 ST (CAST SUPPLIES) IMPLANT
PENCIL BUTTON HOLSTER BLD 10FT (ELECTRODE) ×3 IMPLANT
SHEET MEDIUM DRAPE 40X70 STRL (DRAPES) ×3 IMPLANT
SLEEVE SCD COMPRESS KNEE MED (MISCELLANEOUS) ×1 IMPLANT
SPLINT FIBERGLASS 3X35 (CAST SUPPLIES) IMPLANT
SPLINT FIBERGLASS 4X30 (CAST SUPPLIES) IMPLANT
SPLINT PLASTER CAST XFAST 3X15 (CAST SUPPLIES) IMPLANT
SPLINT PLASTER XTRA FASTSET 3X (CAST SUPPLIES)
SPONGE GAUZE 4X4 12PLY STER LF (GAUZE/BANDAGES/DRESSINGS) IMPLANT
SPONGE LAP 18X18 X RAY DECT (DISPOSABLE) ×4 IMPLANT
STAPLER VISISTAT 35W (STAPLE) IMPLANT
STOCKINETTE 4X48 STRL (DRAPES) IMPLANT
STOCKINETTE 6  STRL (DRAPES)
STOCKINETTE 6 STRL (DRAPES) ×2 IMPLANT
STOCKINETTE IMPERVIOUS LG (DRAPES) IMPLANT
SUCTION FRAZIER HANDLE 10FR (MISCELLANEOUS)
SUCTION TUBE FRAZIER 10FR DISP (MISCELLANEOUS) IMPLANT
SURGILUBE 2OZ TUBE FLIPTOP (MISCELLANEOUS) ×1 IMPLANT
SUT MNCRL AB 4-0 PS2 18 (SUTURE) ×2 IMPLANT
SUT MON AB 5-0 PS2 18 (SUTURE) IMPLANT
SUT SILK 3 0 PS 1 (SUTURE) IMPLANT
SUT SILK 3 0 SH CR/8 (SUTURE) IMPLANT
SUT SILK 4 0 PS 2 (SUTURE) ×1 IMPLANT
SUT SILK 4 0 SH CR/8 (SUTURE) IMPLANT
SUT VIC AB 3-0 FS2 27 (SUTURE) IMPLANT
SUT VIC AB 5-0 P-3 18X BRD (SUTURE) IMPLANT
SUT VIC AB 5-0 P3 18 (SUTURE)
SUT VIC AB 5-0 PS2 18 (SUTURE) ×2 IMPLANT
SUT VICRYL 4-0 PS2 18IN ABS (SUTURE) IMPLANT
SYR BULB IRRIGATION 50ML (SYRINGE) ×3 IMPLANT
SYR CONTROL 10ML LL (SYRINGE) ×3 IMPLANT
TOWEL OR 17X24 6PK STRL BLUE (TOWEL DISPOSABLE) ×4 IMPLANT
TRAY DSU PREP LF (CUSTOM PROCEDURE TRAY) ×3 IMPLANT
TUBE CONNECTING 20X1/4 (TUBING) ×1 IMPLANT
UNDERPAD 30X30 (UNDERPADS AND DIAPERS) ×3 IMPLANT
YANKAUER SUCT BULB TIP NO VENT (SUCTIONS) ×1 IMPLANT

## 2016-01-24 NOTE — Interval H&P Note (Signed)
History and Physical Interval Note:  01/24/2016 1:30 PM  Peter Morgan  has presented today for surgery, with the diagnosis of BASAL CELL CARCINOMA SCALP  The various methods of treatment have been discussed with the patient and family. After consideration of risks, benefits and other options for treatment, the patient has consented to  Procedure(s): SPLIT THICKNESS SKIN GRAFT TO SCALP WITH  (N/A) APPLICATION OF WOUND VAC (N/A) APPLICATION OF A-CELL (N/A) as a surgical intervention .  The patient's history has been reviewed, patient examined, no change in status, stable for surgery.  I have reviewed the patient's chart and labs.  Questions were answered to the patient's satisfaction.     Wallace Going

## 2016-01-24 NOTE — Brief Op Note (Signed)
01/24/2016  1:42 PM  PATIENT:  Peter Morgan  70 y.o. male  PRE-OPERATIVE DIAGNOSIS:  BASAL CELL CARCINOMA SCALP  POST-OPERATIVE DIAGNOSIS:  Scalp wound  PROCEDURE:  Procedure(s): SPLIT THICKNESS SKIN GRAFT TO SCALP WITH  (N/A) APPLICATION OF WOUND VAC (N/A) APPLICATION OF A-CELL (N/A)  SURGEON:  Surgeon(s) and Role:    * Claire S Dillingham, DO - Primary  ASSISTANTS: none   ANESTHESIA:   general  EBL:     BLOOD ADMINISTERED:none  DRAINS: none   LOCAL MEDICATIONS USED:  MARCAINE     SPECIMEN:  No Specimen  DISPOSITION OF SPECIMEN:  N/A  COUNTS:  YES  TOURNIQUET:  * No tourniquets in log *  DICTATION: .Dragon Dictation  PLAN OF CARE: Discharge to home after PACU  PATIENT DISPOSITION:  PACU - hemodynamically stable.   Delay start of Pharmacological VTE agent (>24hrs) due to surgical blood loss or risk of bleeding: no

## 2016-01-24 NOTE — Discharge Instructions (Signed)
Do not remove VAC from the scalp Continue KY gel to the left thigh daily    Post Anesthesia Home Care Instructions  Activity: Get plenty of rest for the remainder of the day. A responsible adult should stay with you for 24 hours following the procedure.  For the next 24 hours, DO NOT: -Drive a car -Paediatric nurse -Drink alcoholic beverages -Take any medication unless instructed by your physician -Make any legal decisions or sign important papers.  Meals: Start with liquid foods such as gelatin or soup. Progress to regular foods as tolerated. Avoid greasy, spicy, heavy foods. If nausea and/or vomiting occur, drink only clear liquids until the nausea and/or vomiting subsides. Call your physician if vomiting continues.  Special Instructions/Symptoms: Your throat may feel dry or sore from the anesthesia or the breathing tube placed in your throat during surgery. If this causes discomfort, gargle with warm salt water. The discomfort should disappear within 24 hours.  If you had a scopolamine patch placed behind your ear for the management of post- operative nausea and/or vomiting:  1. The medication in the patch is effective for 72 hours, after which it should be removed.  Wrap patch in a tissue and discard in the trash. Wash hands thoroughly with soap and water. 2. You may remove the patch earlier than 72 hours if you experience unpleasant side effects which may include dry mouth, dizziness or visual disturbances. 3. Avoid touching the patch. Wash your hands with soap and water after contact with the patch.

## 2016-01-24 NOTE — Transfer of Care (Signed)
Immediate Anesthesia Transfer of Care Note  Patient: Peter Morgan  Procedure(s) Performed: Procedure(s): SPLIT THICKNESS SKIN GRAFT TO SCALP FROM THIGH (N/A) APPLICATION OF WOUND VAC (N/A) APPLICATION OF A-CELL (N/A)  Patient Location: PACU  Anesthesia Type:General  Level of Consciousness: awake, oriented and patient cooperative  Airway & Oxygen Therapy: Patient Spontanous Breathing and Patient connected to face mask oxygen  Post-op Assessment: Report given to RN and Post -op Vital signs reviewed and stable  Post vital signs: Reviewed and stable  Last Vitals:  Filed Vitals:   01/24/16 1208  BP: 123/71  Pulse: 77  Temp: 36.7 C  Resp: 20    Complications: No apparent anesthesia complications

## 2016-01-24 NOTE — Anesthesia Preprocedure Evaluation (Signed)
Anesthesia Evaluation  Patient identified by MRN, date of birth, ID band Patient awake    Reviewed: Allergy & Precautions, NPO status , Patient's Chart, lab work & pertinent test results  Airway Mallampati: II  TM Distance: >3 FB Neck ROM: Full    Dental no notable dental hx.    Pulmonary neg pulmonary ROS,    Pulmonary exam normal breath sounds clear to auscultation       Cardiovascular hypertension, + CAD and + Cardiac Stents  Normal cardiovascular exam Rhythm:Regular Rate:Normal     Neuro/Psych Anxiety negative neurological ROS     GI/Hepatic Neg liver ROS, GERD  Medicated,  Endo/Other  negative endocrine ROS  Renal/GU negative Renal ROS  negative genitourinary   Musculoskeletal negative musculoskeletal ROS (+)   Abdominal   Peds negative pediatric ROS (+)  Hematology negative hematology ROS (+)   Anesthesia Other Findings   Reproductive/Obstetrics negative OB ROS                             Anesthesia Physical Anesthesia Plan  ASA: III  Anesthesia Plan: General   Post-op Pain Management:    Induction: Intravenous  Airway Management Planned: LMA  Additional Equipment:   Intra-op Plan:   Post-operative Plan: Extubation in OR  Informed Consent: I have reviewed the patients History and Physical, chart, labs and discussed the procedure including the risks, benefits and alternatives for the proposed anesthesia with the patient or authorized representative who has indicated his/her understanding and acceptance.   Dental advisory given  Plan Discussed with: CRNA and Surgeon  Anesthesia Plan Comments:         Anesthesia Quick Evaluation

## 2016-01-24 NOTE — Anesthesia Procedure Notes (Signed)
Procedure Name: LMA Insertion Date/Time: 01/24/2016 1:39 PM Performed by: Melynda Ripple D Pre-anesthesia Checklist: Patient identified, Emergency Drugs available, Suction available and Patient being monitored Patient Re-evaluated:Patient Re-evaluated prior to inductionOxygen Delivery Method: Circle System Utilized Preoxygenation: Pre-oxygenation with 100% oxygen Intubation Type: IV induction Ventilation: Mask ventilation without difficulty LMA: LMA inserted LMA Size: 5.0 Number of attempts: 1 Airway Equipment and Method: Bite block Placement Confirmation: positive ETCO2 Tube secured with: Tape Dental Injury: Teeth and Oropharynx as per pre-operative assessment

## 2016-01-24 NOTE — Op Note (Signed)
Operative Note   DATE OF OPERATION: 01/24/2016  LOCATION: [place]  SURGICAL DIVISION: Plastic Surgery  PREOPERATIVE DIAGNOSES:  Scalp wound after mohs resection of basal cell carcinoma  POSTOPERATIVE DIAGNOSES:  same  PROCEDURE:  Split thickness skin graft to scalp 7 x 6 cm with VAC placement to scalp.  Acell (7 x 10 cm sheet) placement to donor site  SURGEON: Theodoro Kos, DO  ANESTHESIA:  General.   COMPLICATIONS: None.   INDICATIONS FOR PROCEDURE:  The patient, Peter Morgan is a 70 y.o. male born on Jun 10, 1946, is here for treatment of a scalp defect after excision of a basal cell carcinoma removed via mohs technique. MRN: [id number]  CONSENT:  Informed consent was obtained directly from the patient. Risks, benefits and alternatives were fully discussed. Specific risks including but not limited to bleeding, infection, hematoma, seroma, scarring, pain, infection, contracture, asymmetry, wound healing problems, and need for further surgery were all discussed. The patient did have an ample opportunity to have questions answered to satisfaction.   DESCRIPTION OF PROCEDURE:  The patient was taken to the operating room. SCDs were placed and IV antibiotics were given. The patient's operative site was prepped and draped in a sterile fashion. A time out was performed and all information was confirmed to be correct.  General anesthesia was administered.  The area was irrigated with saline and antibiotic solution. The #10 blade was used to debride the hypergranulation tissue.  The local was injected into the left thigh for interoperative hemostasis and postoperative pain management.  The Acell 7 x 10 cm sheet was placed on the donor site and secured with 5-0 Vicryl.  An adaptic was placed over the area and secured with 4-0 Silk. The graft was applied to the scalp and secured with 5-0 Vicryl.  The adaptic was sutured on with 4-0 Silk.  The VAC was applied and there was  an excellent seal.  The patient tolerated the procedure well.  There were no complications. The patient was allowed to wake from anesthesia, extubated and taken to the recovery room in satisfactory condition.

## 2016-01-24 NOTE — Anesthesia Postprocedure Evaluation (Signed)
Anesthesia Post Note  Patient: LIEUTENANT PALERMO  Procedure(s) Performed: Procedure(s) (LRB): SPLIT THICKNESS SKIN GRAFT TO SCALP FROM THIGH (N/A) APPLICATION OF WOUND VAC (N/A) APPLICATION OF A-CELL (Left)  Patient location during evaluation: PACU Anesthesia Type: General Level of consciousness: awake and alert Pain management: pain level controlled Vital Signs Assessment: post-procedure vital signs reviewed and stable Respiratory status: spontaneous breathing, nonlabored ventilation, respiratory function stable and patient connected to nasal cannula oxygen Cardiovascular status: blood pressure returned to baseline and stable Postop Assessment: no signs of nausea or vomiting Anesthetic complications: no    Last Vitals:  Filed Vitals:   01/24/16 1444 01/24/16 1445  BP:  125/79  Pulse: 90 89  Temp:    Resp: 12 13    Last Pain: There were no vitals filed for this visit.               Gretna Bergin S

## 2016-01-25 ENCOUNTER — Ambulatory Visit: Payer: Self-pay | Admitting: Cardiology

## 2016-01-25 ENCOUNTER — Encounter (HOSPITAL_BASED_OUTPATIENT_CLINIC_OR_DEPARTMENT_OTHER): Payer: Self-pay | Admitting: Plastic Surgery

## 2016-01-26 DIAGNOSIS — T8189XA Other complications of procedures, not elsewhere classified, initial encounter: Secondary | ICD-10-CM | POA: Diagnosis not present

## 2016-02-01 ENCOUNTER — Encounter: Payer: Self-pay | Admitting: Cardiology

## 2016-02-01 ENCOUNTER — Ambulatory Visit (INDEPENDENT_AMBULATORY_CARE_PROVIDER_SITE_OTHER): Payer: Medicare Other | Admitting: Cardiology

## 2016-02-01 VITALS — BP 114/80 | HR 80 | Ht 69.5 in | Wt 173.2 lb

## 2016-02-01 DIAGNOSIS — I2510 Atherosclerotic heart disease of native coronary artery without angina pectoris: Secondary | ICD-10-CM | POA: Diagnosis not present

## 2016-02-01 NOTE — Progress Notes (Signed)
HPI The patient presents for follow up after a DES to his OM. He had nonobstructive plaque elsewhere.  Since I last saw him he has done well.  He recently had scalp surgery and is off of Plavix and ASA.  From a cardiac standpoint he is doing well.  The patient denies any new symptoms such as chest discomfort, neck or arm discomfort. There has been no new shortness of breath, PND or orthopnea. There have been no reported palpitations, presyncope or syncope.   Allergies  Allergen Reactions  . Codeine Nausea And Vomiting  . Oxycodone Nausea Only  . Tramadol Nausea And Vomiting    Current Outpatient Prescriptions  Medication Sig Dispense Refill  . acetaminophen (TYLENOL) 500 MG tablet Take 1,000 mg by mouth every 4 (four) hours as needed for pain. Pain    . ALPRAZolam (XANAX) 0.5 MG tablet Take 0.5 mg by mouth at bedtime.     Marland Kitchen aspirin 81 MG tablet Take 81 mg by mouth daily.    Marland Kitchen atorvastatin (LIPITOR) 20 MG tablet Take 20 mg by mouth every morning.    . clopidogrel (PLAVIX) 75 MG tablet Take 75 mg by mouth daily.    . fluticasone (FLONASE) 50 MCG/ACT nasal spray Place 1 spray into both nostrils daily.    . metoprolol succinate (TOPROL-XL) 50 MG 24 hr tablet Take 25 mg by mouth every morning. Take with or immediately following a meal.    . nitroGLYCERIN (NITROSTAT) 0.4 MG SL tablet Place 0.4 mg under the tongue every 5 (five) minutes as needed for chest pain.    . pantoprazole (PROTONIX) 40 MG tablet Take 40 mg by mouth daily.    . tamsulosin (FLOMAX) 0.4 MG CAPS Take 0.4 mg by mouth 2 (two) times daily.      No current facility-administered medications for this visit.    Past Medical History  Diagnosis Date  . Hyperlipidemia   . BPH (benign prostatic hyperplasia)   . Arthritis   . Hypertension   . GERD (gastroesophageal reflux disease)   . Anxiety   . Panic attack   . PONV (postoperative nausea and vomiting)   . CAD (coronary artery disease)     Diagnosed 12/2011 following  abnormal treadmill test - high grade OM stenosis s/p DES 01/05/12, mild residual non-obstructive LAD, Lcx, RCA disease.  Normal LV function with EF 55-65% by cath 01/05/12    Past Surgical History  Procedure Laterality Date  . Knee arthroscopy      bilateral  . Appendectomy    . Tonsillectomy and adenoidectomy    . Nasal septum surgery    . Femur surgery    . Coronary stents     . Right middle finger amputation     . Total knee arthroplasty Left 07/11/2013    Procedure: LEFT TOTAL KNEE ARTHROPLASTY ;  Surgeon: Gearlean Alf, MD;  Location: [facility];  Service: Orthopedics;  Laterality: Left;  . Total knee arthroplasty Right 07/10/2014    Procedure: RIGHT TOTAL KNEE ARTHROPLASTY;  Surgeon: Gearlean Alf, MD;  Location: [facility];  Service: Orthopedics;  Laterality: Right;  . Left heart catheterization with coronary angiogram N/A 01/05/2012    Procedure: LEFT HEART CATHETERIZATION WITH CORONARY ANGIOGRAM;  Surgeon: Minus Breeding, MD;  Location: [facility];  Service: Cardiovascular;  Laterality: N/A;  . Joint replacement    . Mohs surgery      basal cell on scalp  . Application of a-cell of head/neck N/A 12/27/2015  Procedure: A-CELL AND VAC PLACEMENT TO SCALP WOUND;  Surgeon: Wallace Going, DO;  Location: [place];  Service: Plastics;  Laterality: N/A;  . Skin split graft N/A 01/24/2016    Procedure: SPLIT THICKNESS SKIN GRAFT TO SCALP FROM THIGH;  Surgeon: Wallace Going, DO;  Location: [place];  Service: Plastics;  Laterality: N/A;  . Application of wound vac N/A 01/24/2016    Procedure: APPLICATION OF WOUND VAC;  Surgeon: Wallace Going, DO;  Location: [place];  Service: Plastics;  Laterality: N/A;  . Application of a-cell of head/neck Left 01/24/2016    Procedure: APPLICATION OF A-CELL;  Surgeon: Wallace Going, DO;  Location: [place];  Service: Plastics;  Laterality: Left;    ROS:  As stated in the  HPI and negative for all other systems.  PHYSICAL EXAM BP 114/80 mmHg  Pulse 80  Ht 5' 9.5" (1.765 m)  Wt 173 lb 4 oz (78.586 kg)  BMI 25.23 kg/m2 GENERAL:  Well appearing NECK:  No jugular venous distention, waveform within normal limits, carotid upstroke brisk and symmetric, no bruits, no thyromegaly LUNGS:  Clear to auscultation bilaterally BACK:  No CVA tenderness CHEST:  Unremarkable HEART:  PMI not displaced or sustained,S1 and S2 within normal limits, no S3, no S4, no clicks, no rubs, no murmurs ABD:  Flat, positive bowel sounds normal in frequency in pitch, no bruits, no rebound, no guarding, no midline pulsatile mass, no hepatomegaly, no splenomegaly EXT:  2 plus pulses throughout, no edema, no cyanosis no clubbing    ASSESSMENT AND PLAN  CAD:  The patient has no new sypmtoms.  No further cardiovascular testing is indicated.  He will restart his ASA today.  He does not need to restart the Plavix.     HYPERLIPIDEMIA:  Follow up with GREEN, Keenan Bachelor, MD.

## 2016-02-01 NOTE — Patient Instructions (Signed)
Your physician wants you to follow-up in: ONE YEAR WITH DR HOCHREIN You will receive a reminder letter in the mail two months in advance. If you don't receive a letter, please call our office to schedule the follow-up appointment.   If you need a refill on your cardiac medications before your next appointment, please call your pharmacy.  

## 2016-02-27 DIAGNOSIS — I2510 Atherosclerotic heart disease of native coronary artery without angina pectoris: Secondary | ICD-10-CM | POA: Diagnosis not present

## 2016-02-27 DIAGNOSIS — Z1329 Encounter for screening for other suspected endocrine disorder: Secondary | ICD-10-CM | POA: Diagnosis not present

## 2016-02-27 DIAGNOSIS — E039 Hypothyroidism, unspecified: Secondary | ICD-10-CM | POA: Diagnosis not present

## 2016-02-27 DIAGNOSIS — C44212 Basal cell carcinoma of skin of right ear and external auricular canal: Secondary | ICD-10-CM | POA: Diagnosis not present

## 2016-02-27 DIAGNOSIS — D559 Anemia due to enzyme disorder, unspecified: Secondary | ICD-10-CM | POA: Diagnosis not present

## 2016-02-27 DIAGNOSIS — Z125 Encounter for screening for malignant neoplasm of prostate: Secondary | ICD-10-CM | POA: Diagnosis not present

## 2016-02-27 DIAGNOSIS — Z0000 Encounter for general adult medical examination without abnormal findings: Secondary | ICD-10-CM | POA: Diagnosis not present

## 2016-02-27 DIAGNOSIS — N400 Enlarged prostate without lower urinary tract symptoms: Secondary | ICD-10-CM | POA: Diagnosis not present

## 2016-02-27 DIAGNOSIS — L570 Actinic keratosis: Secondary | ICD-10-CM | POA: Diagnosis not present

## 2016-02-27 DIAGNOSIS — D485 Neoplasm of uncertain behavior of skin: Secondary | ICD-10-CM | POA: Diagnosis not present

## 2016-03-21 DIAGNOSIS — M5136 Other intervertebral disc degeneration, lumbar region: Secondary | ICD-10-CM | POA: Diagnosis not present

## 2016-03-27 DIAGNOSIS — C44212 Basal cell carcinoma of skin of right ear and external auricular canal: Secondary | ICD-10-CM | POA: Diagnosis not present

## 2016-03-27 DIAGNOSIS — D0439 Carcinoma in situ of skin of other parts of face: Secondary | ICD-10-CM | POA: Diagnosis not present

## 2016-04-07 DIAGNOSIS — M5136 Other intervertebral disc degeneration, lumbar region: Secondary | ICD-10-CM | POA: Diagnosis not present

## 2016-04-28 DIAGNOSIS — D045 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: Secondary | ICD-10-CM | POA: Diagnosis not present

## 2016-06-19 DIAGNOSIS — D045 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: Secondary | ICD-10-CM | POA: Diagnosis not present

## 2016-06-19 DIAGNOSIS — L570 Actinic keratosis: Secondary | ICD-10-CM | POA: Diagnosis not present

## 2016-08-29 DIAGNOSIS — I2510 Atherosclerotic heart disease of native coronary artery without angina pectoris: Secondary | ICD-10-CM | POA: Diagnosis not present

## 2016-08-29 DIAGNOSIS — Z23 Encounter for immunization: Secondary | ICD-10-CM | POA: Diagnosis not present

## 2016-08-29 DIAGNOSIS — R52 Pain, unspecified: Secondary | ICD-10-CM | POA: Diagnosis not present

## 2016-09-04 DIAGNOSIS — H04123 Dry eye syndrome of bilateral lacrimal glands: Secondary | ICD-10-CM | POA: Diagnosis not present

## 2016-09-04 DIAGNOSIS — H02831 Dermatochalasis of right upper eyelid: Secondary | ICD-10-CM | POA: Diagnosis not present

## 2016-09-04 DIAGNOSIS — H2513 Age-related nuclear cataract, bilateral: Secondary | ICD-10-CM | POA: Diagnosis not present

## 2016-09-04 DIAGNOSIS — H02834 Dermatochalasis of left upper eyelid: Secondary | ICD-10-CM | POA: Diagnosis not present

## 2016-09-05 DIAGNOSIS — Z96651 Presence of right artificial knee joint: Secondary | ICD-10-CM | POA: Diagnosis not present

## 2016-09-05 DIAGNOSIS — Z96653 Presence of artificial knee joint, bilateral: Secondary | ICD-10-CM | POA: Diagnosis not present

## 2016-09-05 DIAGNOSIS — Z471 Aftercare following joint replacement surgery: Secondary | ICD-10-CM | POA: Diagnosis not present

## 2016-09-05 DIAGNOSIS — Z96652 Presence of left artificial knee joint: Secondary | ICD-10-CM | POA: Diagnosis not present

## 2016-09-22 DIAGNOSIS — L570 Actinic keratosis: Secondary | ICD-10-CM | POA: Diagnosis not present

## 2016-09-22 DIAGNOSIS — D239 Other benign neoplasm of skin, unspecified: Secondary | ICD-10-CM | POA: Diagnosis not present

## 2016-10-13 DIAGNOSIS — M545 Low back pain: Secondary | ICD-10-CM | POA: Diagnosis not present

## 2016-10-15 DIAGNOSIS — M545 Low back pain: Secondary | ICD-10-CM | POA: Diagnosis not present

## 2016-10-21 DIAGNOSIS — M545 Low back pain: Secondary | ICD-10-CM | POA: Diagnosis not present

## 2016-10-23 DIAGNOSIS — M545 Low back pain: Secondary | ICD-10-CM | POA: Diagnosis not present

## 2016-10-28 DIAGNOSIS — M545 Low back pain: Secondary | ICD-10-CM | POA: Diagnosis not present

## 2016-11-04 DIAGNOSIS — M545 Low back pain: Secondary | ICD-10-CM | POA: Diagnosis not present

## 2016-11-17 DIAGNOSIS — L570 Actinic keratosis: Secondary | ICD-10-CM | POA: Diagnosis not present

## 2017-03-12 ENCOUNTER — Other Ambulatory Visit: Payer: Self-pay | Admitting: Internal Medicine

## 2017-03-12 DIAGNOSIS — B999 Unspecified infectious disease: Secondary | ICD-10-CM

## 2017-03-16 ENCOUNTER — Ambulatory Visit
Admission: RE | Admit: 2017-03-16 | Discharge: 2017-03-16 | Disposition: A | Discharge status: Home / Self Care | Payer: Medicare Other | Source: Ambulatory Visit | Attending: Internal Medicine | Admitting: Internal Medicine

## 2017-03-16 DIAGNOSIS — B999 Unspecified infectious disease: Secondary | ICD-10-CM

## 2017-04-15 ENCOUNTER — Ambulatory Visit (INDEPENDENT_AMBULATORY_CARE_PROVIDER_SITE_OTHER): Payer: Medicare Other | Admitting: Internal Medicine

## 2017-04-15 ENCOUNTER — Encounter: Payer: Self-pay | Admitting: Internal Medicine

## 2017-04-15 DIAGNOSIS — B182 Chronic viral hepatitis C: Secondary | ICD-10-CM | POA: Diagnosis not present

## 2017-04-15 LAB — COMPLETE METABOLIC PANEL WITH GFR
ALK PHOS: 65 U/L (ref 40–115)
ALT: 20 U/L (ref 9–46)
AST: 20 U/L (ref 10–35)
Albumin: 4.3 g/dL (ref 3.6–5.1)
BILIRUBIN TOTAL: 0.6 mg/dL (ref 0.2–1.2)
BUN: 18 mg/dL (ref 7–25)
CALCIUM: 9.7 mg/dL (ref 8.6–10.3)
CO2: 27 mmol/L (ref 20–31)
CREATININE: 0.94 mg/dL (ref 0.70–1.18)
Chloride: 100 mmol/L (ref 98–110)
GFR, Est Non African American: 81 mL/min (ref >=60)
Glucose, Bld: 87 mg/dL (ref 65–99)
Potassium: 4.8 mmol/L (ref 3.5–5.3)
Sodium: 136 mmol/L (ref 135–146)
TOTAL PROTEIN: 6.9 g/dL (ref 6.1–8.1)

## 2017-04-15 LAB — CBC WITH DIFFERENTIAL/PLATELET
BASOS PCT: 1 %
Basophils Absolute: 57 cells/uL (ref 0–200)
Eosinophils Absolute: 285 cells/uL (ref 15–500)
Eosinophils Relative: 5 %
HCT: 40.1 % (ref 38.5–50.0)
Hemoglobin: 13.2 g/dL (ref 13.2–17.1)
LYMPHS PCT: 41 %
Lymphs Abs: 2337 cells/uL (ref 850–3900)
MCH: 32.8 pg (ref 27.0–33.0)
MCHC: 32.9 g/dL (ref 32.0–36.0)
MCV: 99.8 fL (ref 80.0–100.0)
MONOS PCT: 8 %
MPV: 10.4 fL (ref 7.5–12.5)
Monocytes Absolute: 456 cells/uL (ref 200–950)
Neutro Abs: 2565 cells/uL (ref 1500–7800)
Neutrophils Relative %: 45 %
PLATELETS: 94 10*3/uL — AB (ref 140–400)
RBC: 4.02 MIL/uL — AB (ref 4.20–5.80)
RDW: 13.6 % (ref 11.0–15.0)
WBC: 5.7 10*3/uL (ref 3.8–10.8)

## 2017-04-15 NOTE — Patient Instructions (Signed)
Date 04/15/17  Dear Mr. Cruise, As discussed in the [facility], your hepatitis C therapy will include highly effective medication(s) for treatment and will vary based on the type of hepatitis C and insurance approval.  Potential medications include:          Harvoni (sofosbuvir 90mg /ledipasvir 400mg ) tablet oral daily          OR     Epclusa (sofosbuvir 400mg /velpatasvir 100mg ) tablet oral daily          OR      Mavyret (glecaprevir 100 mg/pibrentasvir 40 mg): Take 3 tablets oral daily          OR     Zepatier (elbasvir 50 mg/grazoprevir 100 mg) oral daily, +/- ribavirin              Medications are typically for 8 or 12 weeks total ---------------------------------------------------------------- Your HCV Treatment Start Date: You will be notified by our office once the medication is approved and where you can pick it up (or if mailed)   ---------------------------------------------------------------- [place]:   [facility] [place], [place] [address] Phone: [phone] Hours: Monday to Friday 7:30 am to 6:00 pm   Please always contact your pharmacy at least 3-4 business days before you run out of medications to ensure your next month's medication is ready or 1 week prior to running out if you receive it by mail.  Remember, each prescription is for 28 days. ---------------------------------------------------------------- GENERAL NOTES REGARDING YOUR HEPATITIS C MEDICATION:  Some medications have the following interactions:  - Acid reducing agents such as H2 blockers (ie. Pepcid (famotidine), Zantac (ranitidine), Tagamet (cimetidine), Axid (nizatidine) and proton pump inhibitors (ie. Prilosec (omeprazole), Protonix (pantoprazole), Nexium (esomeprazole), or Aciphex (rabeprazole)). Do not take until you have discussed with a health care provider.    -Antacids that contain magnesium and/or aluminum hydroxide (ie. Milk of Magensia, Rolaids,  Gaviscon, Maalox, Mylanta, an dArthritis Pain Formula).  -Calcium carbonate (calcium supplements or antacids such as Tums, Caltrate, Os-Cal).  -St. John's wort or any products that contain St. John's wort like some herbal supplements  Please inform the office prior to starting any of these medications.  - The common side effects associated with Harvoni include:      1. Fatigue      2. Headache      3. Nausea      4. Diarrhea      5. Insomnia  Please note that this only lists the most common side effects and is NOT a comprehensive list of the potential side effects of these medications. For more information, please review the drug information sheets that come with your medication package from the pharmacy.  ---------------------------------------------------------------- GENERAL HELPFUL HINTS ON HCV THERAPY: 1. Stay well-hydrated. 2. Notify the [facility] of any changes in your other over-the-counter/herbal or prescription medications. 3. If you miss a dose of your medication, take the missed dose as soon as you remember. Return to your regular time/dose schedule the next day.  4.  Do not stop taking your medications without first talking with your healthcare provider. 5.  You may take Tylenol (acetaminophen), as long as the dose is less than 2000 mg (OR no more than 4 tablets of the Tylenol Extra Strengths 500mg  tablet) in 24 hours. 6.  You will see our pharmacist-specialist within the first 2 weeks of starting your medication to monitor for any possible side effects. 7.  You will have labs once during treatment, soon  after treatment completion and one final lab 6 months after treatment completion to verify the virus is out of your system.  Scharlene Gloss, [place] for [place], [place]  [id number] [phone]
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
## 2017-04-16 LAB — HEPATITIS A ANTIBODY, TOTAL: Hep A Total Ab: NONREACTIVE

## 2017-04-16 LAB — HIV ANTIBODY (ROUTINE TESTING W REFLEX): HIV 1&2 Ab, 4th Generation: NONREACTIVE

## 2017-04-16 LAB — PROTIME-INR
INR: 1
Prothrombin Time: 10.4 s (ref 9.0–11.5)

## 2017-04-16 LAB — HEPATITIS B SURFACE ANTIGEN: Hepatitis B Surface Ag: NEGATIVE

## 2017-04-16 LAB — HEPATITIS B CORE ANTIBODY, TOTAL: Hep B Core Total Ab: NONREACTIVE

## 2017-04-16 LAB — HEPATITIS B SURFACE ANTIBODY,QUALITATIVE: Hep B S Ab: NEGATIVE

## 2017-04-17 LAB — HCV RNA, QN PCR RFLX GENO, LIPA

## 2017-04-18 LAB — LIVER FIBROSIS, FIBROTEST-ACTITEST
ALPHA-2-MACROGLOBULIN: 149 mg/dL (ref 106–279)
ALT: 19 U/L (ref 9–46)
APOLIPOPROTEIN A1: 153 mg/dL (ref 94–176)
BILIRUBIN: 0.5 mg/dL (ref 0.2–1.2)
FIBROSIS SCORE: 0.16
GGT: 12 U/L (ref 3–70)
Haptoglobin: 116 mg/dL (ref 43–212)
Necroinflammat ACT Score: 0.06
REFERENCE ID: 1933173

## 2017-04-18 LAB — HIV-1 RNA,QN PCR W/REFLEX GENOTYPE: HIV-1 RNA, QN PCR: <1.3 Log cps/mL

## 2017-04-21 ENCOUNTER — Telehealth: Payer: Self-pay | Admitting: *Deleted

## 2017-04-21 NOTE — Telephone Encounter (Signed)
-----   Message from Thayer Headings, MD sent at 04/21/2017 10:41 AM EDT ----- Could you let him know that his HCV test I did here is actually negative for the virus so it looks like it cleared spontaneously. He was exposed in the past but it has cleared. thanks

## 2017-04-21 NOTE — Telephone Encounter (Signed)
Patient notified and he was very happy to hear this.

## 2017-04-28 ENCOUNTER — Ambulatory Visit (HOSPITAL_COMMUNITY): Payer: Medicare Other

## 2017-06-03 ENCOUNTER — Encounter: Payer: Medicare Other | Admitting: Internal Medicine

## 2017-07-29 ENCOUNTER — Ambulatory Visit
Admission: RE | Admit: 2017-07-29 | Discharge: 2017-07-29 | Disposition: A | Discharge status: Home / Self Care | Payer: Medicare Other | Source: Ambulatory Visit | Attending: Internal Medicine | Admitting: Internal Medicine

## 2017-07-29 ENCOUNTER — Other Ambulatory Visit: Payer: Self-pay | Admitting: Internal Medicine

## 2017-07-29 DIAGNOSIS — J329 Chronic sinusitis, unspecified: Secondary | ICD-10-CM

## 2017-08-03 ENCOUNTER — Other Ambulatory Visit: Payer: Medicare Other

## 2017-09-25 IMAGING — CT CT MAXILLOFACIAL W/O CM
2 of 3 series · 15 of 37 positions shown, 18 images · non-contrast
Comparison: None.

CLINICAL DATA: 70-year-old male with nasal and facial congestion
since October 2016. Finished antibiotics November 2016. No prior
surgery. Initial encounter.

EXAM:
CT MAXILLOFACIAL WITHOUT CONTRAST
TECHNIQUE: Multidetector CT imaging of the maxillofacial structures was
performed. Multiplanar CT image reconstructions were also generated.
A small metallic BB was placed on the right temple in order to
reliably differentiate right from left.

[Series 601: coronal facial · coronal · 0.33mm/px · 3 of 83 slices shown]
[im 28/83  bone]
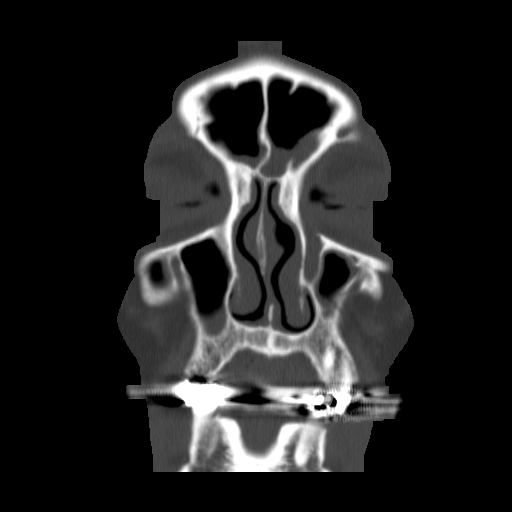
[im 42/83  bone]
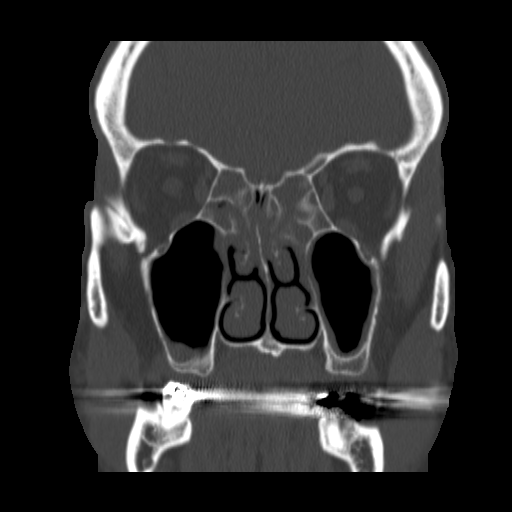
[im 55/83  bone]
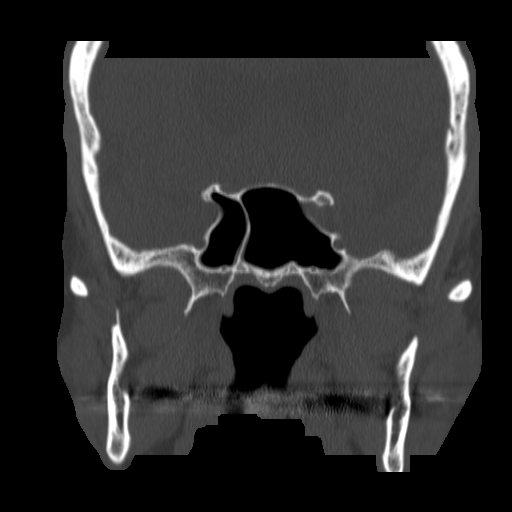

[Series 602: sagittal facial · sagittal · 0.33mm/px · 12 of 83 slices shown, 15 images]
[im 5/83  brain]
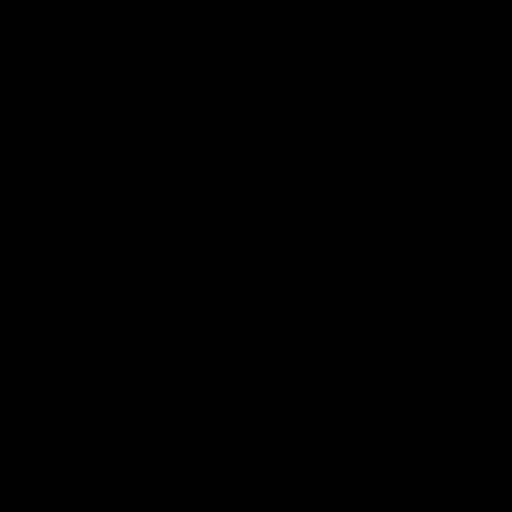
[im 5/83  bone]
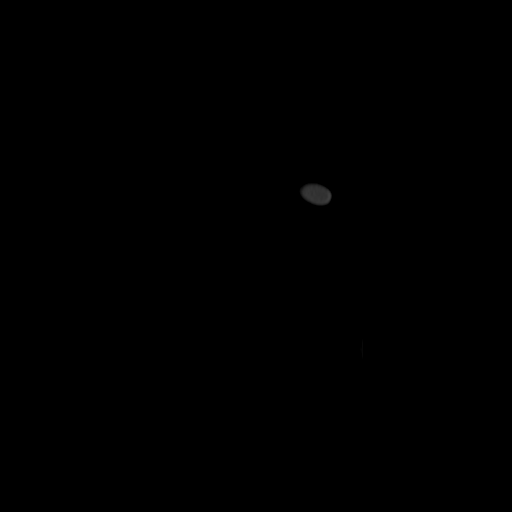
[im 15/83  bone]
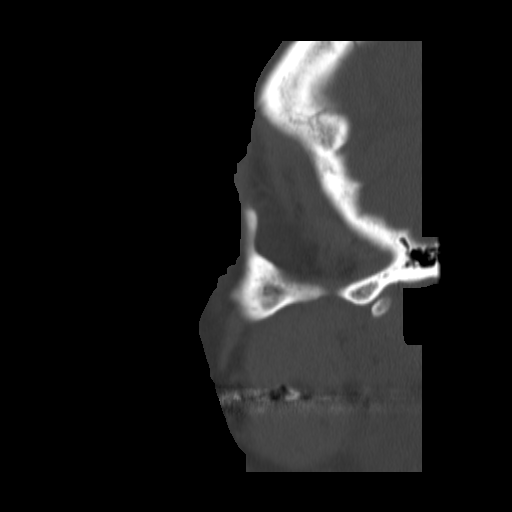
[im 20/83  bone]
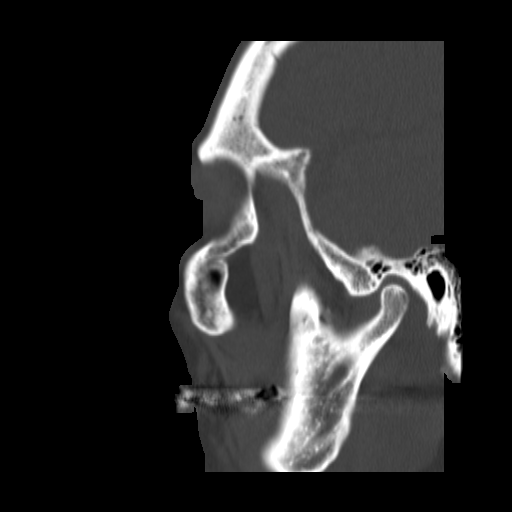
[im 25/83  bone]
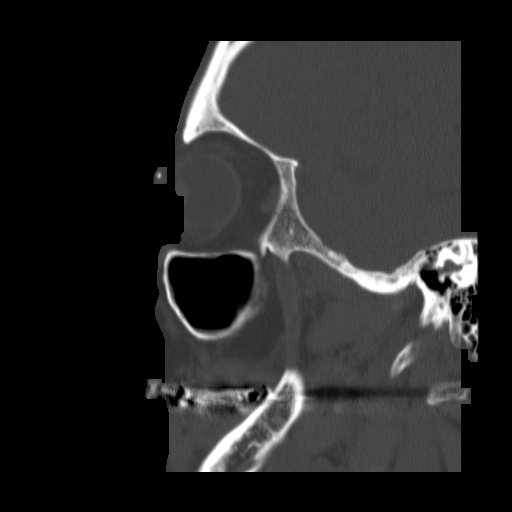
[im 34/83  brain]
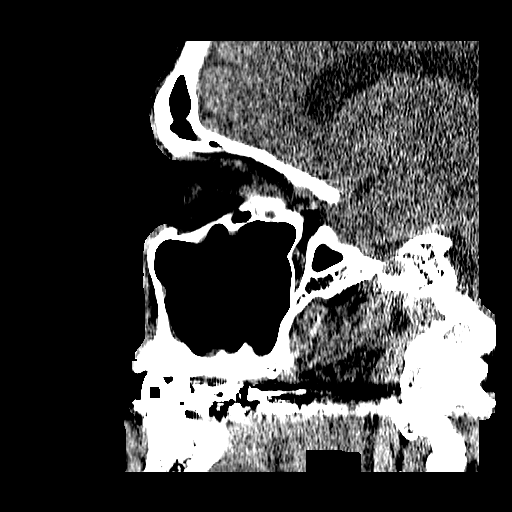
[im 34/83  bone]
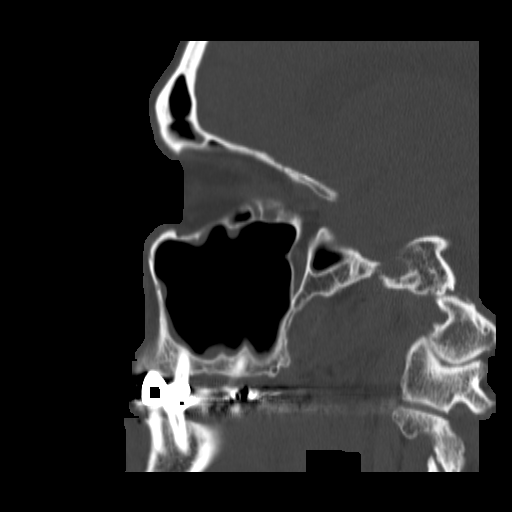
[im 39/83  bone]
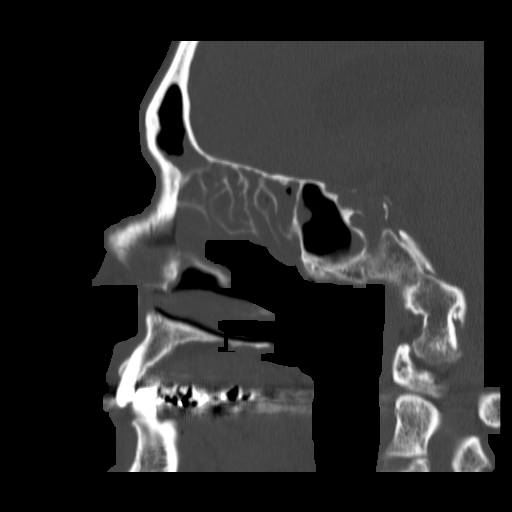
[im 44/83  bone]
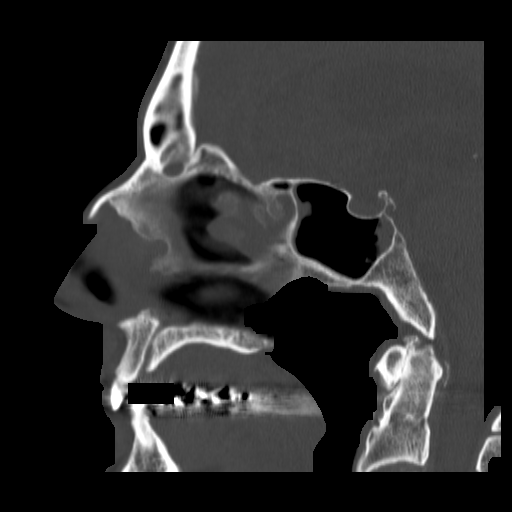
[im 54/83  bone]
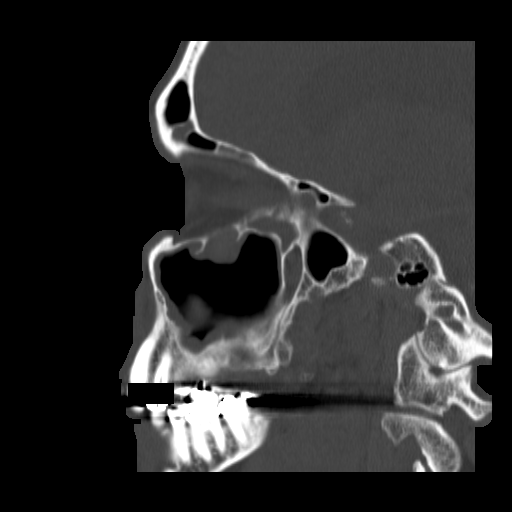
[im 58/83  brain]
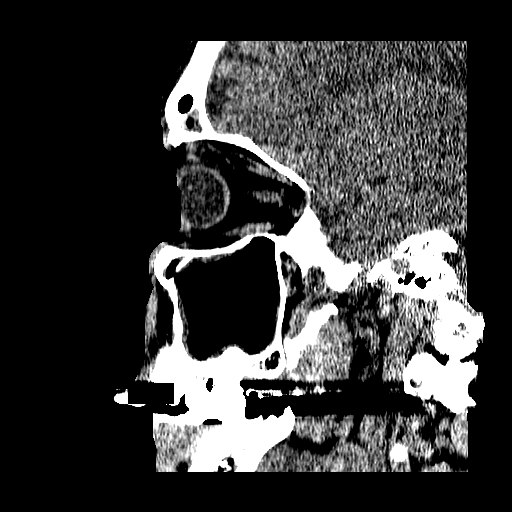
[im 58/83  bone]
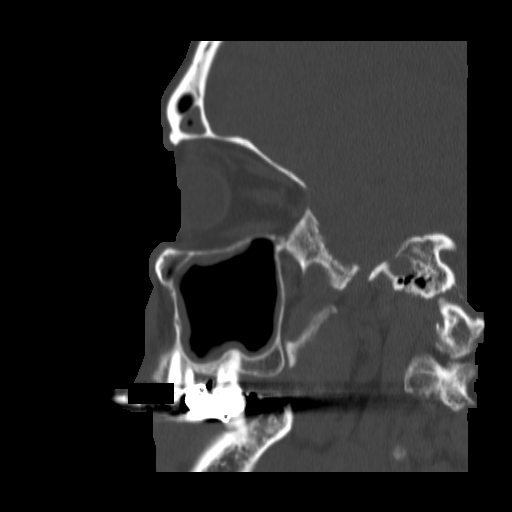
[im 63/83  bone]
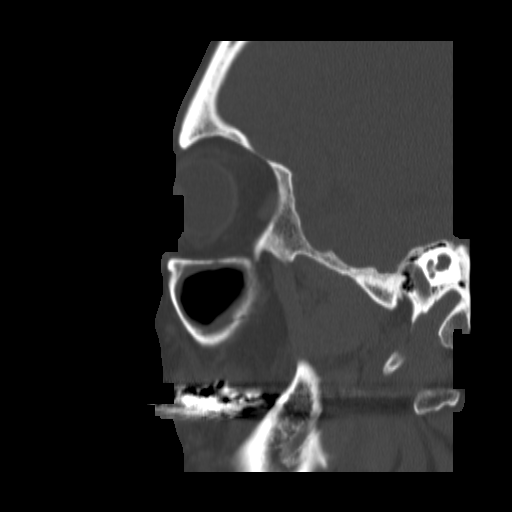
[im 73/83  bone]
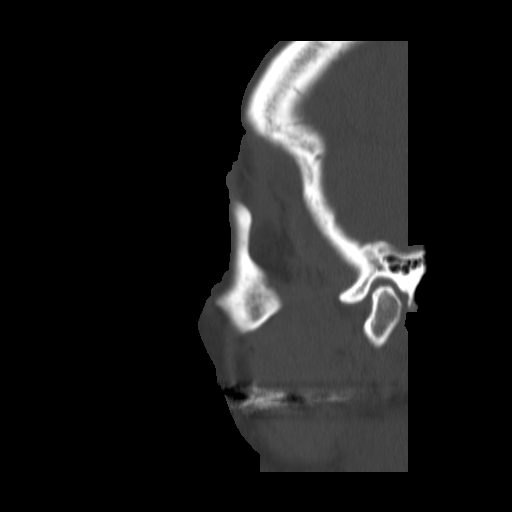
[im 78/83  bone]
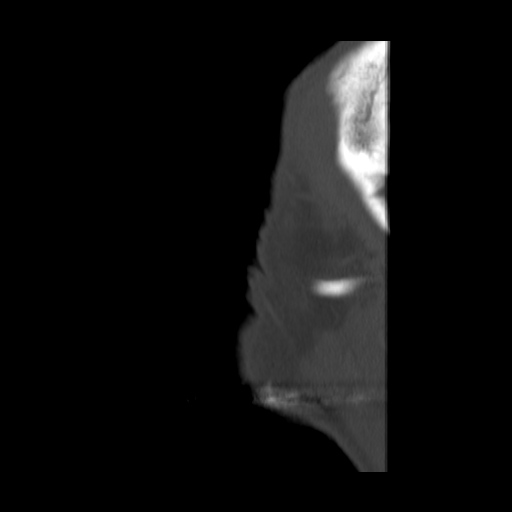

[15 of 37 positions shown; findings below may reference images not displayed]

FINDINGS: Frontal sinuses: Mucosal thickening/ partial opacification
bilaterally with opacification frontoethmoidal recess bilaterally.

Ethmoid sinuses: Almost complete opacification ethmoid sinus air
cells bilaterally. Keros 2 configuration bilaterally. No
intraorbital extension of inflammatory process. Air cells on both
sides of the anterior ethmoidal artery recess bilaterally.

Sphenoid sinuses: Dominant left sphenoid sinus. Mucosal thickening
sphenoid sinuses bilaterally including anterior aspect of the
sphenoid sinus polypoid mucosal thickening measuring up to 6 mm
bilaterally. Bubbly opacification posterior left sphenoid sinus.
Septum directed towards the right carotid artery. Carotid artery
canal partially forms the posterior and lateral aspect of the
sphenoid sinus bilaterally. The optic nerve courses along the
superolateral aspect of the sphenoid sinus bilaterally. The bony
cover of the carotid canal and optic nerve canal is thin or
potentially minimally dehiscent bilaterally. Aeration of the
sphenoid sinuses extends into the clinoid bilaterally. Sphenoid
sinus aeration extends to the posterior aspect of the clivus.

Maxillary sinuses: Mucosal thickening bilaterally measuring up to
6.7 mm on the right (inferiorly) and 11 mm anteromedial aspect left
maxillary sinus. Opacification of the infundibulum bilaterally. The
roots of the upper teeth project partially into the floor of the
maxillary sinus bilaterally.

Nasal vault: Curvature of the nasal septum with small spur. Right
uncinate process vertically positioned. Narrowed left infundibulum.

Other:

Radiopaque structures anterior to the right globe may be
calcifications however if the patient were to have MR scanning,
plain film exam to exclude the less likely consideration of tiny
metallic foreign bodies recommended.

Intracranial  atrophy.

Mild degenerative changes C1-2 articulation.

Incidentally noted are tiny calcifications upper right palatine
tonsil consistent prior inflammation.
IMPRESSION: Pan sinus opacification/mucosal thickening as detailed above.
Important bony landmarks as noted.

Radiopaque structures anterior to the right globe may be
calcifications however if the patient were to have MR scanning,
plain film exam to exclude the less likely consideration of tiny
metallic foreign bodies recommended.

Intracranial atrophy.

## 2018-08-30 NOTE — Progress Notes (Signed)
HPI The patient presents for follow up after a DES to his OM. He had nonobstructive plaque elsewhere.  Since I last saw him he is had no cardiovascular complaints.  He walks about 45 minutes a day though is limited by back pain.  He is mostly been bothered by anxiety.  This has been particularly problematic and is having to take Xanax particularly with a social situation.  The patient denies any new symptoms such as chest discomfort, neck or arm discomfort. There has been no new shortness of breath, PND or orthopnea. There have been no reported palpitations, presyncope or syncope.  Allergies  Allergen Reactions  . Codeine Nausea And Vomiting  . Oxycodone Nausea Only  . Tramadol Nausea And Vomiting    Current Outpatient Medications  Medication Sig Dispense Refill  . acetaminophen (TYLENOL) 500 MG tablet Take 1,000 mg by mouth every 4 (four) hours as needed for pain. Pain    . ALPRAZolam (XANAX) 0.5 MG tablet Take 0.5 mg by mouth at bedtime.     Marland Kitchen aspirin 81 MG tablet Take 81 mg by mouth daily.    Marland Kitchen atorvastatin (LIPITOR) 20 MG tablet Take 20 mg by mouth every morning.    . metoprolol succinate (TOPROL-XL) 50 MG 24 hr tablet Take 25 mg by mouth every morning. Take with or immediately following a meal.    . nitroGLYCERIN (NITROSTAT) 0.4 MG SL tablet Place 0.4 mg under the tongue every 5 (five) minutes as needed for chest pain.    . pantoprazole (PROTONIX) 40 MG tablet Take 40 mg by mouth daily.    . tamsulosin (FLOMAX) 0.4 MG CAPS Take 0.4 mg by mouth 2 (two) times daily.      No current facility-administered medications for this visit.     Past Medical History:  Diagnosis Date  . Anxiety   . Arthritis   . BPH (benign prostatic hyperplasia)   . CAD (coronary artery disease)    Diagnosed 12/2011 following abnormal treadmill test - high grade OM stenosis s/p DES 01/05/12, mild residual non-obstructive LAD, Lcx, RCA disease.  Normal LV function with EF 55-65% by cath 01/05/12  . GERD  (gastroesophageal reflux disease)   . Hyperlipidemia   . Hypertension   . Panic attack   . PONV (postoperative nausea and vomiting)     Past Surgical History:  Procedure Laterality Date  . APPENDECTOMY    . APPLICATION OF A-CELL OF HEAD/NECK N/A 12/27/2015   Procedure: A-CELL AND VAC PLACEMENT TO SCALP WOUND;  Surgeon: Wallace Going, DO;  Location: [place];  Service: Plastics;  Laterality: N/A;  . APPLICATION OF A-CELL OF HEAD/NECK Left 01/24/2016   Procedure: APPLICATION OF A-CELL;  Surgeon: Wallace Going, DO;  Location: [place];  Service: Plastics;  Laterality: Left;  . APPLICATION OF WOUND VAC N/A 01/24/2016   Procedure: APPLICATION OF WOUND VAC;  Surgeon: Wallace Going, DO;  Location: [place];  Service: Plastics;  Laterality: N/A;  . coronary stents     . FEMUR SURGERY    . JOINT REPLACEMENT    . KNEE ARTHROSCOPY     bilateral  . LEFT HEART CATHETERIZATION WITH CORONARY ANGIOGRAM N/A 01/05/2012   Procedure: LEFT HEART CATHETERIZATION WITH CORONARY ANGIOGRAM;  Surgeon: Minus Breeding, MD;  [facility] C[facility] LAB;  Service: Cardiovascular;  Laterality: N/A;  . MOHS SURGERY     basal cell on scalp  . NASAL SEPTUM SURGERY    .  right middle finger amputation     . SKIN SPLIT GRAFT N/A 01/24/2016   Procedure: SPLIT THICKNESS SKIN GRAFT TO SCALP FROM THIGH;  Surgeon: Wallace Going, DO;  Location: [place];  Service: Plastics;  Laterality: N/A;  . TONSILLECTOMY AND ADENOIDECTOMY    . TOTAL KNEE ARTHROPLASTY Left 07/11/2013   Procedure: LEFT TOTAL KNEE ARTHROPLASTY ;  Surgeon: Gearlean Alf, MD;  Location: [facility];  Service: Orthopedics;  Laterality: Left;  . TOTAL KNEE ARTHROPLASTY Right 07/10/2014   Procedure: RIGHT TOTAL KNEE ARTHROPLASTY;  Surgeon: Gearlean Alf, MD;  Location: [facility];  Service: Orthopedics;  Laterality: Right;    ROS:  As stated in the HPI and negative for all other  systems.  PHYSICAL EXAM BP 109/67   Pulse 70   Ht 5' 9.5" (1.765 m)   Wt 166 lb 9.6 oz (75.6 kg)   BMI 24.25 kg/m  GENERAL:  Well appearing NECK:  No jugular venous distention, waveform within normal limits, carotid upstroke brisk and symmetric, no bruits, no thyromegaly LUNGS:  Clear to auscultation bilaterally CHEST:  Unremarkable HEART:  PMI not displaced or sustained,S1 and S2 within normal limits, no S3, no S4, no clicks, no rubs, no murmurs ABD:  Flat, positive bowel sounds normal in frequency in pitch, no bruits, no rebound, no guarding, no midline pulsatile mass, no hepatomegaly, no splenomegaly EXT:  2 plus pulses throughout, no edema, no cyanosis no clubbing  EKG:  Sinus rhythm, rate 70, axis within normal limits, intervals within normal limits, no acute ST-T wave changes.    ASSESSMENT AND PLAN  CAD: The patient has no new sypmtoms.  No further cardiovascular testing is indicated.  We will continue with aggressive risk reduction and meds as listed.  HYPERLIPIDEMIA:  This is followed by Dr. Nyoka Cowden.  No change in therapy.

## 2018-08-31 ENCOUNTER — Encounter: Payer: Self-pay | Admitting: Cardiology

## 2018-08-31 ENCOUNTER — Ambulatory Visit: Payer: Medicare Other | Admitting: Cardiology

## 2018-08-31 VITALS — BP 109/67 | HR 70 | Ht 69.5 in | Wt 166.6 lb

## 2018-08-31 DIAGNOSIS — I2510 Atherosclerotic heart disease of native coronary artery without angina pectoris: Secondary | ICD-10-CM | POA: Diagnosis not present

## 2018-08-31 NOTE — Patient Instructions (Signed)
Medication Instructions:  Continue current medications  If you need a refill on your cardiac medications before your next appointment, please call your pharmacy.  Labwork: None Ordered   Testing/Procedures: None Ordered   Follow-Up: Your physician wants you to follow-up in: 18 Months. You should receive a reminder letter in the mail two months in advance. If you do not receive a letter, please call our office in [phone] to schedule your follow-up appointment.      Thank you for choosing [facility] at [facility]!!

## 2018-09-17 NOTE — Addendum Note (Signed)
Addended by: Leland Johns A on: 09/17/2018 04:32 PM   Modules accepted: Orders

## 2019-01-13 ENCOUNTER — Encounter (HOSPITAL_BASED_OUTPATIENT_CLINIC_OR_DEPARTMENT_OTHER): Payer: Medicare Other | Attending: Internal Medicine

## 2019-01-13 DIAGNOSIS — T23251A Burn of second degree of right palm, initial encounter: Secondary | ICD-10-CM | POA: Diagnosis not present

## 2019-01-13 DIAGNOSIS — Z96643 Presence of artificial hip joint, bilateral: Secondary | ICD-10-CM | POA: Diagnosis not present

## 2019-01-13 DIAGNOSIS — Z85828 Personal history of other malignant neoplasm of skin: Secondary | ICD-10-CM | POA: Diagnosis not present

## 2019-01-13 DIAGNOSIS — I10 Essential (primary) hypertension: Secondary | ICD-10-CM | POA: Diagnosis not present

## 2019-01-13 DIAGNOSIS — X150XXA Contact with hot stove (kitchen), initial encounter: Secondary | ICD-10-CM | POA: Insufficient documentation

## 2019-01-13 DIAGNOSIS — W19XXXA Unspecified fall, initial encounter: Secondary | ICD-10-CM | POA: Diagnosis not present

## 2019-01-13 DIAGNOSIS — I2510 Atherosclerotic heart disease of native coronary artery without angina pectoris: Secondary | ICD-10-CM | POA: Diagnosis not present

## 2019-01-27 ENCOUNTER — Encounter (HOSPITAL_BASED_OUTPATIENT_CLINIC_OR_DEPARTMENT_OTHER): Payer: Medicare Other | Attending: Internal Medicine

## 2019-09-01 ENCOUNTER — Telehealth: Payer: Self-pay | Admitting: Cardiology

## 2019-09-01 NOTE — Telephone Encounter (Signed)
Called patient, he states that for the past week he has noticed it a little more increased SOB with some dizziness. Patient states he had skin cancer surgery a week ago on the top of his head-  Patient denies chest pain, or swelling in hands feet.  Patient denies checking the blood pressure at home, states that when they checked it before the surgery it was higher than normal for him, but he has no recent blood pressure.  Denies SOB at rest, only when doing activities.  Denies changes in diet/ or medications.  Advised patient to consider taking it easy for a few days to see if the SOB improves as he was going on hikes around his home and that's when he noticed the issues.  Advised patient I would route to MD to make aware and give any recommendations. Patient cell number to call back for recommendations: [phone]

## 2019-09-01 NOTE — Telephone Encounter (Signed)
New Message    Pt c/o Shortness Of Breath: STAT if SOB developed within the last 24 hours or pt is noticeably SOB on the phone  1. Are you currently SOB (can you hear that pt is SOB on the phone)? No  2. How long have you been experiencing SOB? About 3 or 4 days  3. Are you SOB when sitting or when up moving around? Just when patient is hiking or exercising   4. Are you currently experiencing any other symptoms? Dizziness

## 2019-09-01 NOTE — Telephone Encounter (Signed)
Agree with plan for appt. 

## 2019-09-01 NOTE — Telephone Encounter (Addendum)
Scheduled appointment for 09/07/19 with Arnold Long DNP Patient aware of date, time, and location  Advised would call back if any further recommendations from Dr Percival Spanish

## 2019-09-06 NOTE — Progress Notes (Signed)
Cardiology Office Note   Date:  09/07/2019   ID:  Peter Morgan, DOB 1946/11/30, MRN [id number]  PCP:  Levin Erp, MD  Cardiologist:  Dr.Hochrein  No chief complaint on file.    History of Present Illness: Peter Morgan is a 73 y.o. male who presents for ongoing assessment and management of coronary artery disease, history of drug-eluting stent to his OM with nonobstructive plaque elsewhere on 21/21/ 2013.  Other history includes hypertension, hyperlipidemia, panic attacks.,  Anxiety and arthritis.  The patient called our office on 09/01/2019 with complaints of shortness of breath and dizziness.  He had recently had skin cancer removed from the top of his head and since that time has had these symptoms.  He denied any chest pain or shortness of breath at rest only when doing activities.  He also noted that his blood pressure was higher than it normally has been running.  He was advised to be seen today for follow-up.  He presents to the clinic today and states he had a cancerous lesion removed from the frontal aspect of his head 1-1/2 weeks ago.  He has a cabin in [place] that he has been maintaining and was using his wheelbarrow to move some heavier objects when he became dizzy and had to lower the wheelbarrow to the ground.  He states that he also has noticed some dizziness while he has in his garden and is bent over during the activity.  He states that yesterday he washed a large van and a large SUV and did not notice any dizziness or shortness of breath.  He went on to say that he has continued his daily walks, meditation, and weightlifting activities.  When asked about his daily hydration he stated he does not do a very good job drinking.  He denies chest pain, shortness of breath, lower extremity edema, fatigue, palpitations, melena, hematuria, hemoptysis, diaphoresis, weakness, presyncope, syncope, orthopnea, and PND.    Past Medical History:  Diagnosis Date  . Anxiety   .  Arthritis   . BPH (benign prostatic hyperplasia)   . CAD (coronary artery disease)    Diagnosed 12/2011 following abnormal treadmill test - high grade OM stenosis s/p DES 01/05/12, mild residual non-obstructive LAD, Lcx, RCA disease.  Normal LV function with EF 55-65% by cath 01/05/12  . GERD (gastroesophageal reflux disease)   . Hyperlipidemia   . Hypertension   . Panic attack   . PONV (postoperative nausea and vomiting)     Past Surgical History:  Procedure Laterality Date  . APPENDECTOMY    . APPLICATION OF A-CELL OF HEAD/NECK N/A 12/27/2015   Procedure: A-CELL AND VAC PLACEMENT TO SCALP WOUND;  Surgeon: Wallace Going, DO;  Location: [place];  Service: Plastics;  Laterality: N/A;  . APPLICATION OF A-CELL OF HEAD/NECK Left 01/24/2016   Procedure: APPLICATION OF A-CELL;  Surgeon: Wallace Going, DO;  Location: [place];  Service: Plastics;  Laterality: Left;  . APPLICATION OF WOUND VAC N/A 01/24/2016   Procedure: APPLICATION OF WOUND VAC;  Surgeon: Wallace Going, DO;  Location: [place];  Service: Plastics;  Laterality: N/A;  . coronary stents     . FEMUR SURGERY    . JOINT REPLACEMENT    . KNEE ARTHROSCOPY     bilateral  . LEFT HEART CATHETERIZATION WITH CORONARY ANGIOGRAM N/A 01/05/2012   Procedure: LEFT HEART CATHETERIZATION WITH CORONARY ANGIOGRAM;  Surgeon: Minus Breeding, MD;  Location: [facility] C[facility] LAB;  Service: Cardiovascular;  Laterality: N/A;  . MOHS SURGERY     basal cell on scalp  . NASAL SEPTUM SURGERY    . right middle finger amputation     . SKIN SPLIT GRAFT N/A 01/24/2016   Procedure: SPLIT THICKNESS SKIN GRAFT TO SCALP FROM THIGH;  Surgeon: Wallace Going, DO;  Location: [place];  Service: Plastics;  Laterality: N/A;  . TONSILLECTOMY AND ADENOIDECTOMY    . TOTAL KNEE ARTHROPLASTY Left 07/11/2013   Procedure: LEFT TOTAL KNEE ARTHROPLASTY ;  Surgeon: Gearlean Alf, MD;  Location[facility];   Service: Orthopedics;  Laterality: Left;  . TOTAL KNEE ARTHROPLASTY Right 07/10/2014   Procedure: RIGHT TOTAL KNEE ARTHROPLASTY;  Surgeon: Gearlean Alf, MD;  Location: [facility];  Service: Orthopedics;  Laterality: Right;     Current Outpatient Medications  Medication Sig Dispense Refill  . acetaminophen (TYLENOL) 500 MG tablet Take 1,000 mg by mouth every 4 (four) hours as needed for pain. Pain    . aspirin 81 MG tablet Take 81 mg by mouth daily.    Marland Kitchen atorvastatin (LIPITOR) 20 MG tablet Take 20 mg by mouth every morning.    . clonazePAM (KLONOPIN) 0.5 MG tablet TK 1 T PO BID    . metoprolol succinate (TOPROL-XL) 50 MG 24 hr tablet Take 25 mg by mouth every morning. Take with or immediately following a meal.    . nitroGLYCERIN (NITROSTAT) 0.4 MG SL tablet Place 0.4 mg under the tongue every 5 (five) minutes as needed for chest pain.    . pantoprazole (PROTONIX) 40 MG tablet Take 40 mg by mouth daily.    . tamsulosin (FLOMAX) 0.4 MG CAPS Take 0.4 mg by mouth 2 (two) times daily.      No current facility-administered medications for this visit.     Allergies:   Codeine, Oxycodone, and Tramadol    Social History:  The patient  reports that he has never smoked. He has never used smokeless tobacco. He reports current alcohol use of about 14.0 standard drinks of alcohol per week. He reports that he does not use drugs.   Family History:  The patient's family history includes Coronary artery disease in his father.    ROS: All other systems are reviewed and negative. Unless otherwise mentioned in H&P    PHYSICAL EXAM: VS:  BP 118/78   Pulse 71   Temp 97.9 F (36.6 C)   Ht 5' 9.5" (1.765 m)   Wt 177 lb (80.3 kg)   SpO2 99%   BMI 25.76 kg/m  , BMI Body mass index is 25.76 kg/m. GEN: Well nourished, well developed, in no acute distress HEENT: normal Neck: no JVD, carotid bruits, or masses Cardiac: RRR; no murmurs, rubs, or gallops,no edema  Respiratory:  Clear to auscultation  bilaterally, normal work of breathing GI: soft, nontender, nondistended, + BS MS: no deformity or atrophy Skin: warm and dry, no rash Neuro:  Strength and sensation are intact Psych: euthymic mood, full affect    EKG:  EKG is ordered today. The ekg ordered today demonstrates normal sinus rhythm 69 bpm  EKG 08/31/2018 Normal sinus rhythm 70 bpm Today yesterday when he is felt cold all the time that was on part 35 Recent Labs: No results found for requested labs within last 8760 hours.    Lipid Panel    Component Value Date/Time   CHOL 157 02/24/2013 0804   TRIG 90.0 02/24/2013 0804   HDL 59.80 02/24/2013 0804  CHOLHDL 3 02/24/2013 0804   VLDL 18.0 02/24/2013 0804   LDLCALC 79 02/24/2013 0804      Wt Readings from Last 3 Encounters:  09/07/19 177 lb (80.3 kg)  08/31/18 166 lb 9.6 oz (75.6 kg)  04/15/17 173 lb (78.5 kg)      Other studies Reviewed: Echocardiogram 01-11-12 Left ventricle: The cavity size was normal. Wall thickness  was normal. The estimated ejection fraction was 60%. Wall  motion was normal; there were no regional wall motion  abnormalities.  - Mitral valve: No regurgitation.  - Right ventricle: The cavity size was mildly dilated.  - Right atrium: The atrium was mildly dilated.  - Pulmonary arteries: PA peak pressure: 29mm Hg (S).    ASSESSMENT AND PLAN:  1.  Orthostatic hypotension- blood pressure laying 130/76, sitting 122/84, standing 108/74 and at 2-3 minutes 120/72.  He noticed slight dizziness moving from laying to sitting and from sitting to standing. Ordered BMP today Increase p.o. fluids Move slowly from sitting to standing and maintain physical activity.  Avoid vigorous physical activity while head incisions are continuing to heal.  2.  Coronary artery disease-no chest pain today.  Patient continues to be very physically active and has no exertional pain, shortness of breath, diaphoresis or palpitations. Continue aspirin 81 mg  tablet daily Continue atorvastatin 20 mg tablet daily Continue metoprolol succinate 25 mg daily Continue nitroglycerin 0.4 mg sublingual as needed Maintain physical activity Heart healthy low-sodium diet  3.  Hyperlipidemia-LDL 79 02/24/2013 Continue atorvastatin 20 mg tablet daily Monitored by PCP  Current medicines are reviewed at length with the patient today.    Labs/ tests ordered today include: BMP  Disposition: Follow-up with APP in 4 weeks.   Coletta Memos NP-C    09/07/2019 9:59 AM    [facility] [place] [address] Office [phone] Fax [phone]

## 2019-09-07 ENCOUNTER — Other Ambulatory Visit: Payer: Self-pay

## 2019-09-07 ENCOUNTER — Ambulatory Visit (INDEPENDENT_AMBULATORY_CARE_PROVIDER_SITE_OTHER): Payer: Medicare Other | Admitting: General Practice

## 2019-09-07 ENCOUNTER — Encounter: Payer: Self-pay | Admitting: Adult Health

## 2019-09-07 VITALS — BP 118/78 | HR 71 | Temp 97.9°F | Ht 69.5 in | Wt 177.0 lb

## 2019-09-07 DIAGNOSIS — I2510 Atherosclerotic heart disease of native coronary artery without angina pectoris: Secondary | ICD-10-CM | POA: Diagnosis not present

## 2019-09-07 DIAGNOSIS — Z79899 Other long term (current) drug therapy: Secondary | ICD-10-CM

## 2019-09-07 DIAGNOSIS — E7800 Pure hypercholesterolemia, unspecified: Secondary | ICD-10-CM | POA: Diagnosis not present

## 2019-09-07 DIAGNOSIS — I951 Orthostatic hypotension: Secondary | ICD-10-CM

## 2019-09-07 LAB — BASIC METABOLIC PANEL
BUN/Creatinine Ratio: 17 (ref 10–24)
BUN: 15 mg/dL (ref 8–27)
CO2: 25 mmol/L (ref 20–29)
Calcium: 10.2 mg/dL (ref 8.6–10.2)
Chloride: 99 mmol/L (ref 96–106)
Creatinine, Ser: 0.89 mg/dL (ref 0.76–1.27)
GFR calc Af Amer: 98 mL/min/{1.73_m2} (ref >59)
GFR calc non Af Amer: 85 mL/min/{1.73_m2} (ref >59)
Glucose: 92 mg/dL (ref 65–99)
Potassium: 5.1 mmol/L (ref 3.5–5.2)
Sodium: 138 mmol/L (ref 134–144)

## 2019-09-07 NOTE — Patient Instructions (Signed)
Medication Instructions:  Continue current medications  If you need a refill on your cardiac medications before your next appointment, please call your pharmacy.  Labwork: BMP Today HERE IN OUR OFFICE AT [facility]   You will NOT need to fast   Take the provided lab slips with you to the lab for your blood draw.   When you have your labs (blood work) drawn today and your tests are completely normal, you will receive your results only by MyChart Message (if you have MyChart) -OR-  A paper copy in the mail.  If you have any lab test that is abnormal or we need to change your treatment, we will call you to review these results.  Testing/Procedures: None Ordered  Special Instructions: Increase fluid(water)  Follow-Up: . Your physician recommends that you schedule a follow-up appointment in: 1 Month   At [facility], you and your health needs are our priority.  As part of our continuing mission to provide you with exceptional heart care, we have created designated Provider Care Teams.  These Care Teams include your primary Cardiologist (physician) and Advanced Practice Providers (APPs -  Physician Assistants and Nurse Practitioners) who all work together to provide you with the care you need, when you need it.  Thank you for choosing [facility] at [facility]!!

## 2019-10-09 NOTE — Progress Notes (Signed)
Cardiology Office Note   Date:  10/10/2019   ID:  Peter Morgan, DOB 1946/12/05, MRN [id number]  PCP:  Levin Erp, MD  Cardiologist: Dr. Percival Spanish CC: Follow Up   History of Present Illness: Peter Morgan is a 73 y.o. male who presents for ongoing assessment and management of CAD, DES to his OM with nonobstructive disease elsewhere per cardiac catheterization on 12/04/2012.  Other history includes hypertension, hyperlipidemia, panic attacks, and arthritis.  He was last seen in the office on 09/07/2019 by Coletta Memos, NP at which time he had recently had a cancerous lesion removed from his forehead.  He was physically active working at his cabin in [place], doing yard work and maintenance in his house and on the property. He has some complaints of minor dizziness.   On that visit, the patient was found to be orthostatic, and was symptomatic with slight dizziness moving from laying to sitting.  He was advised to increase his p.o. fluids, BMET was ordered.  He was continued on aspirin, atorvastatin metoprolol and nitroglycerin as needed.  He is here for follow-up to evaluate his blood pressure and symptoms.  Peter Morgan comes today without any new complaints.  He has not had any recurrent dizziness or near syncope.  On review of his chart and in discussion with him today I believe this is more vagal.  He continues to exercise every day walking approximately an hour each time.  He remains active in his yard and is trying to stay hydrated.  Past Medical History:  Diagnosis Date  . Anxiety   . Arthritis   . BPH (benign prostatic hyperplasia)   . CAD (coronary artery disease)    Diagnosed 12/2011 following abnormal treadmill test - high grade OM stenosis s/p DES 01/05/12, mild residual non-obstructive LAD, Lcx, RCA disease.  Normal LV function with EF 55-65% by cath 01/05/12  . GERD (gastroesophageal reflux disease)   . Hyperlipidemia   . Hypertension   . Panic attack   . PONV  (postoperative nausea and vomiting)     Past Surgical History:  Procedure Laterality Date  . APPENDECTOMY    . APPLICATION OF A-CELL OF HEAD/NECK N/A 12/27/2015   Procedure: A-CELL AND VAC PLACEMENT TO SCALP WOUND;  Surgeon: Wallace Going, DO;  Location: [place];  Service: Plastics;  Laterality: N/A;  . APPLICATION OF A-CELL OF HEAD/NECK Left 01/24/2016   Procedure: APPLICATION OF A-CELL;  Surgeon: Wallace Going, DO;  Location: [place];  Service: Plastics;  Laterality: Left;  . APPLICATION OF WOUND VAC N/A 01/24/2016   Procedure: APPLICATION OF WOUND VAC;  Surgeon: Wallace Going, DO;  Location: [place];  Service: Plastics;  Laterality: N/A;  . coronary stents     . FEMUR SURGERY    . JOINT REPLACEMENT    . KNEE ARTHROSCOPY     bilateral  . LEFT HEART CATHETERIZATION WITH CORONARY ANGIOGRAM N/A 01/05/2012   Procedure: LEFT HEART CATHETERIZATION WITH CORONARY ANGIOGRAM;  Surgeon: Minus Breeding, MD;  Location: [facility] C[facility] LAB;  Service: Cardiovascular;  Laterality: N/A;  . MOHS SURGERY     basal cell on scalp  . NASAL SEPTUM SURGERY    . right middle finger amputation     . SKIN SPLIT GRAFT N/A 01/24/2016   Procedure: SPLIT THICKNESS SKIN GRAFT TO SCALP FROM THIGH;  Surgeon: Wallace Going, DO;  Location: [place];  Service: Plastics;  Laterality: N/A;  . TONSILLECTOMY AND  ADENOIDECTOMY    . TOTAL KNEE ARTHROPLASTY Left 07/11/2013   Procedure: LEFT TOTAL KNEE ARTHROPLASTY ;  Surgeon: Gearlean Alf, MD;  Location: [facility];  Service: Orthopedics;  Laterality: Left;  . TOTAL KNEE ARTHROPLASTY Right 07/10/2014   Procedure: RIGHT TOTAL KNEE ARTHROPLASTY;  Surgeon: Gearlean Alf, MD;  Location: [facility];  Service: Orthopedics;  Laterality: Right;     Current Outpatient Medications  Medication Sig Dispense Refill  . acetaminophen (TYLENOL) 500 MG tablet Take 1,000 mg by mouth every 4 (four) hours as needed for  pain. Pain    . aspirin 81 MG tablet Take 81 mg by mouth daily.    Marland Kitchen atorvastatin (LIPITOR) 20 MG tablet Take 20 mg by mouth every morning.    . clonazePAM (KLONOPIN) 0.5 MG tablet TK 1 T PO BID    . metoprolol succinate (TOPROL-XL) 50 MG 24 hr tablet Take 25 mg by mouth every morning. Take with or immediately following a meal.    . nitroGLYCERIN (NITROSTAT) 0.4 MG SL tablet Place 0.4 mg under the tongue every 5 (five) minutes as needed for chest pain.    . pantoprazole (PROTONIX) 40 MG tablet Take 40 mg by mouth daily.    . tamsulosin (FLOMAX) 0.4 MG CAPS Take 0.4 mg by mouth 2 (two) times daily.      No current facility-administered medications for this visit.     Allergies:   Codeine, Oxycodone, and Tramadol    Social History:  The patient  reports that he has never smoked. He has never used smokeless tobacco. He reports current alcohol use of about 14.0 standard drinks of alcohol per week. He reports that he does not use drugs.   Family History:  The patient's family history includes Coronary artery disease in his father.    ROS: All other systems are reviewed and negative. Unless otherwise mentioned in H&P    PHYSICAL EXAM: VS:  BP 126/71   Pulse 69   Ht 5' 9.5" (1.765 m)   Wt 181 lb (82.1 kg)   SpO2 99%   BMI 26.35 kg/m  , BMI Body mass index is 26.35 kg/m. GEN: Well nourished, well developed, in no acute distress HEENT: normal Neck: no JVD, carotid bruits, or masses Cardiac: RRR; no murmurs, rubs, or gallops,no edema  Respiratory:  Clear to auscultation bilaterally, normal work of breathing GI: soft, nontender, nondistended, + BS MS: no deformity or atrophy Skin: warm and dry, no rash Neuro:  Strength and sensation are intact Psych: euthymic mood, full affect   EKG: Not completed this office visit.  Recent Labs: 09/07/2019: BUN 15; Creatinine, Ser 0.89; Potassium 5.1; Sodium 138    Lipid Panel    Component Value Date/Time   CHOL 157 02/24/2013 0804   TRIG  90.0 02/24/2013 0804   HDL 59.80 02/24/2013 0804   CHOLHDL 3 02/24/2013 0804   VLDL 18.0 02/24/2013 0804   LDLCALC 79 02/24/2013 0804      Wt Readings from Last 3 Encounters:  10/10/19 181 lb (82.1 kg)  09/07/19 177 lb (80.3 kg)  08/31/18 166 lb 9.6 oz (75.6 kg)      Other studies Reviewed: Echocardiogram 19-Jan-2012 Left ventricle: The cavity size was normal. Wall thickness  was normal. The estimated ejection fraction was 60%. Wall  motion was normal; there were no regional wall motion  abnormalities.  - Mitral valve: No regurgitation.  - Right ventricle: The cavity size was mildly dilated.  - Right atrium: The atrium was mildly  dilated.    ASSESSMENT AND PLAN:  1.  Syncope: He denies any further complaints at this time.  He remains active, exercises every day, has no complaints of chest pain or dizziness with exertion.  This appears to be more vagal in etiology when reviewing his records and talking with the patient more about the episode.  He was working outside, it was hot, he was lifting heavy objects to include a wheelbarrow  and using a hoe while working in his yard. Marland Kitchen   He is staying more hydrated now and has no further complaints.  We will continue watchful waiting.  I have explained to him if he begins to have these episodes again with minimal exertion, or while at rest, he will need to let us know.  May need to have a cardiac monitor placed to evaluate for cardiac arrhythmias, or bradycardia causing his symptoms.  He is currently not on any rate reducing medications.  2.  Coronary artery disease: History of drug-eluting stent to his OM in 2013.  Continue secondary prevention.  3.  Hypertension: Blood pressure is well controlled today.  Hydration has been helpful to him.  May need to address alpha-blocker, tamsulosin dosing if he has recurrent hypotension.   Current medicines are reviewed at length with the patient today.    Labs/ tests ordered today include: None  Phill Myron. West Pugh, ANP, AACC   10/10/2019 10:53 AM    [facility] [place] [address] Office [phone] Fax [phone]  Notice: This dictation was prepared with Dragon dictation along with smaller phrase technology. Any transcriptional errors that result from this process are unintentional and may not be corrected upon review.

## 2019-10-10 ENCOUNTER — Ambulatory Visit: Payer: Medicare Other | Admitting: Adult Health

## 2019-10-10 ENCOUNTER — Encounter: Payer: Self-pay | Admitting: Adult Health

## 2019-10-10 ENCOUNTER — Other Ambulatory Visit: Payer: Self-pay

## 2019-10-10 VITALS — BP 126/71 | HR 69 | Ht 69.5 in | Wt 181.0 lb

## 2019-10-10 DIAGNOSIS — I9589 Other hypotension: Secondary | ICD-10-CM | POA: Diagnosis not present

## 2019-10-10 DIAGNOSIS — R55 Syncope and collapse: Secondary | ICD-10-CM | POA: Diagnosis not present

## 2019-10-10 DIAGNOSIS — I10 Essential (primary) hypertension: Secondary | ICD-10-CM

## 2019-10-10 DIAGNOSIS — I2510 Atherosclerotic heart disease of native coronary artery without angina pectoris: Secondary | ICD-10-CM | POA: Diagnosis not present

## 2019-10-10 NOTE — Patient Instructions (Signed)
Medication Instructions:  Continue current medications  *If you need a refill on your cardiac medications before your next appointment, please call your pharmacy*  Lab Work: None Ordered  Testing/Procedures: None Ordered  Follow-Up: At [facility], you and your health needs are our priority.  As part of our continuing mission to provide you with exceptional heart care, we have created designated Provider Care Teams.  These Care Teams include your primary Cardiologist (physician) and Advanced Practice Providers (APPs -  Physician Assistants and Nurse Practitioners) who all work together to provide you with the care you need, when you need it.  Your next appointment:   6 month(s)  The format for your next appointment:   In Person  Provider:   James Hochrein, MD    

## 2020-02-07 ENCOUNTER — Ambulatory Visit: Payer: Medicare Other

## 2020-02-22 ENCOUNTER — Telehealth: Payer: Self-pay | Admitting: Cardiology

## 2020-02-22 NOTE — Telephone Encounter (Signed)
Spoke with pt's wife and just wanted clarification on upcoming  appt and to see if pt had an earlier appt which he did not Pt thought he had strange phone call earlier not sure /cy

## 2020-02-22 NOTE — Telephone Encounter (Signed)
Patient states that he got a call from our office but the name of the patient and the doctor was mispronounced and he's not sure if someone called him by accident. He states the message was for a Peter Morgan from Dr. Georgiann Cocker office. I advised that we do not have a Dr. Georgiann Cocker in the office. Not sure if it was a mispronunciation but I went ahead and scheduled him with Dr. Percival Spanish for his f/u appt on 04/16/20 beings that I didn't see any notes about anyone calling him. He would like a call back with some clarification on the phone call because he states this is very suspicious.

## 2020-03-26 ENCOUNTER — Encounter: Payer: Self-pay | Admitting: Dermatology

## 2020-03-26 ENCOUNTER — Other Ambulatory Visit: Payer: Self-pay

## 2020-03-26 ENCOUNTER — Ambulatory Visit: Payer: Medicare Other | Admitting: Dermatology

## 2020-03-26 DIAGNOSIS — Z85828 Personal history of other malignant neoplasm of skin: Secondary | ICD-10-CM | POA: Diagnosis not present

## 2020-03-26 DIAGNOSIS — L570 Actinic keratosis: Secondary | ICD-10-CM

## 2020-03-26 DIAGNOSIS — D485 Neoplasm of uncertain behavior of skin: Secondary | ICD-10-CM

## 2020-03-26 DIAGNOSIS — D492 Neoplasm of unspecified behavior of bone, soft tissue, and skin: Secondary | ICD-10-CM

## 2020-03-26 NOTE — Patient Instructions (Signed)

## 2020-03-26 NOTE — Progress Notes (Addendum)
   Follow-Up Visit   Subjective  Peter Morgan is a 74 y.o. male who presents for the following: Skin Problem (left cheek for 106months -spot a little tender).  New growths Location: Face Duration: Several months Quality: Larger Associated Signs/Symptoms: Sore Modifying Factors:  Severity:  Timing: Context: History of multiple skin cancers  The following portions of the chart were reviewed this encounter and updated as appropriate: Tobacco  Allergies  Meds  Problems  Med Hx  Surg Hx  Fam Hx      Objective  Well appearing patient in no apparent distress; mood and affect are within normal limits.  A focused examination was performed including scalp, face, neck, arms.. Relevant physical exam findings are noted in the Assessment and Plan.   Assessment & Plan  AK (actinic keratosis) (3) Mid Occipital Scalp; Left Temple; Right Forehead  Intervention treatment  Neoplasm of skin (3) Right mid forehead  Skin / nail biopsy Type of biopsy: tangential   Informed consent: discussed and consent obtained   Anesthesia: the lesion was anesthetized in a standard fashion   Anesthetic:  1% lidocaine w/ epinephrine 1-100,000 local infiltration Instrument used: flexible razor blade   Hemostasis achieved with: ferric subsulfate   Outcome: patient tolerated procedure well   Post-procedure details: sterile dressing applied and wound care instructions given   Dressing type: petrolatum   Additional details:  Patient identified lesion of concern.  Lesion identified by physician.  Specimen 1 - Surgical pathology Differential Diagnosis: BCC vs SCC Check Margins: No  Left Malar Cheek  Skin / nail biopsy Type of biopsy: tangential   Informed consent: discussed and consent obtained   Anesthesia: the lesion was anesthetized in a standard fashion   Anesthetic:  1% lidocaine w/ epinephrine 1-100,000 local infiltration Instrument used: flexible razor blade   Hemostasis achieved with:  ferric subsulfate   Outcome: patient tolerated procedure well   Post-procedure details: sterile dressing applied and wound care instructions given   Dressing type: petrolatum   Additional details:  Patient identified lesion of concern.  Lesion identified by physician.  Specimen 2 - Surgical pathology Differential Diagnosis: BCC vs SCC Check Margins: No  Right mid front scalp  Skin / nail biopsy Type of biopsy: tangential   Informed consent: discussed and consent obtained   Anesthesia: the lesion was anesthetized in a standard fashion   Anesthetic:  1% lidocaine w/ epinephrine 1-100,000 local infiltration Instrument used: flexible razor blade   Hemostasis achieved with: ferric subsulfate   Outcome: patient tolerated procedure well   Post-procedure details: sterile dressing applied and wound care instructions given   Dressing type: petrolatum   Additional details:  Patient identified lesion of concern.  Lesion identified by physician.  Specimen 3 - Surgical pathology Differential Diagnosis: BCC vs SCC Check Margins: No

## 2020-03-28 ENCOUNTER — Telehealth: Payer: Self-pay | Admitting: *Deleted

## 2020-03-28 NOTE — Telephone Encounter (Signed)
PATHOLOGY RESULTS TO PATIENT. INFORMED  THAT RIGHT MID FOREHEAD WAS A SQUAMOUS CELL CARCINOMA IN SITU. APPOINTMENT SCHEDULED 30 MINUTE APPOINTMENT WITH DR. Denna Haggard

## 2020-04-11 ENCOUNTER — Encounter: Payer: Self-pay | Admitting: *Deleted

## 2020-04-12 ENCOUNTER — Encounter: Payer: Self-pay | Admitting: Dermatology

## 2020-04-12 ENCOUNTER — Ambulatory Visit (INDEPENDENT_AMBULATORY_CARE_PROVIDER_SITE_OTHER): Payer: Medicare Other | Admitting: Dermatology

## 2020-04-12 ENCOUNTER — Other Ambulatory Visit: Payer: Self-pay

## 2020-04-12 DIAGNOSIS — L570 Actinic keratosis: Secondary | ICD-10-CM | POA: Diagnosis not present

## 2020-04-12 DIAGNOSIS — D0439 Carcinoma in situ of skin of other parts of face: Secondary | ICD-10-CM

## 2020-04-12 DIAGNOSIS — D099 Carcinoma in situ, unspecified: Secondary | ICD-10-CM

## 2020-04-12 NOTE — Patient Instructions (Signed)

## 2020-04-15 DIAGNOSIS — Z7189 Other specified counseling: Secondary | ICD-10-CM | POA: Insufficient documentation

## 2020-04-15 DIAGNOSIS — I10 Essential (primary) hypertension: Secondary | ICD-10-CM | POA: Insufficient documentation

## 2020-04-15 DIAGNOSIS — R55 Syncope and collapse: Secondary | ICD-10-CM | POA: Insufficient documentation

## 2020-04-15 NOTE — Progress Notes (Signed)
Cardiology Office Note   Date:  04/16/2020   ID:  Peter BICKSLER, DOB August 01, 1946, MRN [id number]  PCP:  Peter Hose, NP  Cardiologist:   Peter Breeding, MD   Chief Complaint  Patient presents with  . Headache      History of Present Illness: Peter Morgan is a 74 y.o. male who presents  of CAD.  He had DES to his OM with nonobstructive disease elsewhere per cardiac catheterization on 12/04/2012.  He was seen last year and he had dizziness and orthostasis.    He did have a little dizziness yesterday but he is not having frank syncope.  He does not hydrate.  He works at his cabin in [place] and when he is bending over and standing up he might get lightheaded and dizzy.  He is not feeling palpitations, presyncope or syncope.  He is not having any chest pressure, neck or arm discomfort.  He has had no weight gain or edema. Past Medical History:  Diagnosis Date  . Anxiety   . Arthritis   . Basal cell carcinoma 12/18/1998   right forehead - CX3 + excision  . Basal cell carcinoma 04/04/1993   post left shoulder - tx p bx  . Basal cell carcinoma 04/04/1993   front, right base of neck - tx p bx  . Basal cell carcinoma 04/20/2006   left side of nose - MOHs  . Basal cell carcinoma 11/23/2013   top of crown - CX3 + 5FU + excision  . Basal cell carcinoma 10/17/2014   left forearm - CX3 + 5FU  . Basal cell carcinoma 05/21/2015   right shoulder - CX3 + 5FU  . Basal cell carcinoma 09/04/2015   left mid line scalp [facility])  . Basal cell carcinoma 09/04/2015   right mid line scalp [facility])  . Basal cell carcinoma 09/04/2015   mid line inf. scalp [facility])  . Basal cell carcinoma 09/04/2015   left upper arm [facility])  . Basal cell carcinoma 11/26/2015   right shoulder - tx p bx  . Basal cell carcinoma 02/27/2016   right ear rim, sup - CX3 + 5FU  . Basal cell carcinoma 04/27/2019   mid forehead, central [facility])  . Basal cell carcinoma 04/27/2019   left medial scalp [facility])  .  Basal cell carcinoma 10/03/2019   right scalp, post [facility])  . Basal cell carcinoma 11/16/2019   right post scalp [facility])  . BPH (benign prostatic hyperplasia)   . CAD (coronary artery disease)    Diagnosed 12/2011 following abnormal treadmill test - high grade OM stenosis s/p DES 01/05/12, mild residual non-obstructive LAD, Lcx, RCA disease.  Normal LV function with EF 55-65% by cath 01/05/12  . GERD (gastroesophageal reflux disease)   . Hyperlipidemia   . Hypertension   . Panic attack   . PONV (postoperative nausea and vomiting)   . Squamous cell carcinoma of skin 10/17/2014   bridge of nose - CX3 + 5FU  . Squamous cell carcinoma of skin 05/21/2015   left sideburn - CX3 + 5FU  . Squamous cell carcinoma of skin 05/21/2015   right temple - CX3 + 5FU  . Squamous cell carcinoma of skin 10/25/2015   right eye underneath - tx p bx  . Squamous cell carcinoma of skin 03/27/2016   right side forehead - tx p bx  . Squamous cell carcinoma of skin 04/28/2016   left chest - CX3 + 5FU  . Squamous cell carcinoma of  skin 04/28/2016   sternum - CX3 + 5FU  . Squamous cell carcinoma of skin 04/28/2016   right inferior clavicle - CX3 + 5FU  . Squamous cell carcinoma of skin 12/17/2016   right anterior forehead - CX3 + 5FU  . Squamous cell carcinoma of skin 07/13/2018   right upper forehead, inf - CX3 + 5FU  . Squamous cell carcinoma of skin 04/27/2019   right mid forehead - CX3 + 5FU  . Squamous cell carcinoma of skin 03/26/2020   situ- right mid forehead CX3+5FU    Past Surgical History:  Procedure Laterality Date  . APPENDECTOMY    . APPLICATION OF A-CELL OF HEAD/NECK N/A 12/27/2015   Procedure: A-CELL AND VAC PLACEMENT TO SCALP WOUND;  Surgeon: Wallace Going, DO;  Location: [place];  Service: Plastics;  Laterality: N/A;  . APPLICATION OF A-CELL OF HEAD/NECK Left 01/24/2016   Procedure: APPLICATION OF A-CELL;  Surgeon: Wallace Going, DO;  Location: [place];  Service: Plastics;  Laterality: Left;  . APPLICATION OF WOUND VAC N/A 01/24/2016   Procedure: APPLICATION OF WOUND VAC;  Surgeon: Wallace Going, DO;  Location: [place];  Service: Plastics;  Laterality: N/A;  . coronary stents     . FEMUR SURGERY    . JOINT REPLACEMENT    . KNEE ARTHROSCOPY     bilateral  . LEFT HEART CATHETERIZATION WITH CORONARY ANGIOGRAM N/A 01/05/2012   Procedure: LEFT HEART CATHETERIZATION WITH CORONARY ANGIOGRAM;  Surgeon: Peter Breeding, MD;  Location: [facility] CATH LAB;  Service: Cardiovascular;  Laterality: N/A;  . MOHS SURGERY     basal cell on scalp  . NASAL SEPTUM SURGERY    . right middle finger amputation     . SKIN SPLIT GRAFT N/A 01/24/2016   Procedure: SPLIT THICKNESS SKIN GRAFT TO SCALP FROM THIGH;  Surgeon: Wallace Going, DO;  Location: [place];  Service: Plastics;  Laterality: N/A;  . TONSILLECTOMY AND ADENOIDECTOMY    . TOTAL KNEE ARTHROPLASTY Left 07/11/2013   Procedure: LEFT TOTAL KNEE ARTHROPLASTY ;  Surgeon: Gearlean Alf, MD;  Location[facility];  Service: Orthopedics;  Laterality: Left;  . TOTAL KNEE ARTHROPLASTY Right 07/10/2014   Procedure: RIGHT TOTAL KNEE ARTHROPLASTY;  Surgeon: Gearlean Alf, MD;  Location[facility];  Service: Orthopedics;  Laterality: Right;     Current Outpatient Medications  Medication Sig Dispense Refill  . acetaminophen (TYLENOL) 500 MG tablet Take 1,000 mg by mouth every 4 (four) hours as needed for pain. Pain    . aspirin 81 MG tablet Take 81 mg by mouth daily.    Marland Kitchen atorvastatin (LIPITOR) 20 MG tablet Take 20 mg by mouth every morning.    . clonazePAM (KLONOPIN) 0.5 MG tablet TK 1 T PO BID    . metoprolol succinate (TOPROL-XL) 50 MG 24 hr tablet Take 25 mg by mouth every morning. Take with or immediately following a meal.    . nitroGLYCERIN (NITROSTAT) 0.4 MG SL tablet Place 0.4 mg under the tongue every 5 (five) minutes as needed for chest pain.    . pantoprazole  (PROTONIX) 40 MG tablet Take 40 mg by mouth daily.    . tamsulosin (FLOMAX) 0.4 MG CAPS Take 0.4 mg by mouth 2 (two) times daily.      No current facility-administered medications for this visit.    Allergies:   Codeine, Oxycodone, and Tramadol    ROS:  Please see the history  of present illness.   Otherwise, review of systems are positive for none.   All other systems are reviewed and negative.    PHYSICAL EXAM: VS:  BP 124/72 (BP Location: Left Arm, Patient Position: Sitting, Cuff Size: Normal)   Pulse 70   Temp 97.9 F (36.6 C)   Ht 5' 9.5" (1.765 m)   Wt 182 lb (82.6 kg)   BMI 26.49 kg/m  , BMI Body mass index is 26.49 kg/m. GENERAL:  Well appearing NECK:  No jugular venous distention, waveform within normal limits, carotid upstroke brisk and symmetric, no bruits, no thyromegaly LUNGS:  Clear to auscultation bilaterally CHEST:  Unremarkable HEART:  PMI not displaced or sustained,S1 and S2 within normal limits, no S3, no S4, no clicks, no rubs, no murmurs ABD:  Flat, positive bowel sounds normal in frequency in pitch, no bruits, no rebound, no guarding, no midline pulsatile mass, no hepatomegaly, no splenomegaly EXT:  2 plus pulses throughout, no edema, no cyanosis no clubbing     EKG:  EKG is ordered today. The ekg ordered today demonstrates sinus rhythm, rate 70, axis within normal limits, intervals within normal limits, no acute ST-T wave changes.   Recent Labs: 09/07/2019: BUN 15; Creatinine, Ser 0.89; Potassium 5.1; Sodium 138    Lipid Panel    Component Value Date/Time   CHOL 157 02/24/2013 0804   TRIG 90.0 02/24/2013 0804   HDL 59.80 02/24/2013 0804   CHOLHDL 3 02/24/2013 0804   VLDL 18.0 02/24/2013 0804   LDLCALC 79 02/24/2013 0804      Wt Readings from Last 3 Encounters:  04/16/20 182 lb (82.6 kg)  10/10/19 181 lb (82.1 kg)  09/07/19 177 lb (80.3 kg)      Other studies Reviewed: Additional studies/ records that were reviewed today include:  Labs. Review of the above records demonstrates:  Please see elsewhere in the note.     ASSESSMENT AND PLAN:  SYNCOPE: He has had no further episodes.  I think this is orthostasis related to probable dehydration and he says he does not drink water at all.  We talked about this and he needs to stay hydrated.  CAD:  The patient has no new sypmtoms.  No further cardiovascular testing is indicated.  We will continue with aggressive risk reduction and meds as listed.  HTN: Blood pressures well controlled.  No change in therapy.  COVID EDUCATION:  He has had his vaccinations    Current medicines are reviewed at length with the patient today.  The patient does not have concerns regarding medicines.  The following changes have been made:  no change  Labs/ tests ordered today include: none  Orders Placed This Encounter  Procedures  . EKG 12-Lead     Disposition:   FU with me in one year.     Signed, Peter Breeding, MD  04/16/2020 11:46 AM    [facility]

## 2020-04-16 ENCOUNTER — Encounter: Payer: Self-pay | Admitting: Cardiology

## 2020-04-16 ENCOUNTER — Ambulatory Visit: Payer: Medicare Other | Admitting: Cardiology

## 2020-04-16 ENCOUNTER — Other Ambulatory Visit: Payer: Self-pay

## 2020-04-16 VITALS — BP 124/72 | HR 70 | Temp 97.9°F | Ht 69.5 in | Wt 182.0 lb

## 2020-04-16 DIAGNOSIS — I2510 Atherosclerotic heart disease of native coronary artery without angina pectoris: Secondary | ICD-10-CM | POA: Diagnosis not present

## 2020-04-16 DIAGNOSIS — Z7189 Other specified counseling: Secondary | ICD-10-CM | POA: Diagnosis not present

## 2020-04-16 DIAGNOSIS — I10 Essential (primary) hypertension: Secondary | ICD-10-CM

## 2020-04-16 DIAGNOSIS — R55 Syncope and collapse: Secondary | ICD-10-CM | POA: Diagnosis not present

## 2020-04-16 NOTE — Patient Instructions (Signed)

## 2020-04-30 NOTE — Progress Notes (Signed)
   Follow-Up Visit   Subjective  Peter Morgan is a 74 y.o. male who presents for the following: Procedure (Here for tx of CISx1 on right mid forehead.).  CIS Location: Right forehead Duration:  Quality:  Associated Signs/Symptoms: Modifying Factors:  Severity:  Timing: Context: For treatment  The following portions of the chart were reviewed this encounter and updated as appropriate:     Objective  Well appearing patient in no apparent distress; mood and affect are within normal limits.  Focused examination head and neck.   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  Destruction of lesion Complexity: simple   Destruction method: electrodesiccation and curettage   Informed consent: discussed and consent obtained   Timeout:  patient name, date of birth, surgical site, and procedure verified Anesthesia: the lesion was anesthetized in a standard fashion   Anesthetic:  1% lidocaine w/ epinephrine 1-100,000 local infiltration Curettage performed in three different directions: Yes   Curettage cycles:  3 Lesion length (cm):  1.5 Margin per side (cm):  0 Hemostasis achieved with:  ferric subsulfate Outcome: patient tolerated procedure well with no complications   Post-procedure details: wound care instructions given   Additional details:  Inoculated with parenteral 5% fluorouracil  Treated with Cx3+5FU 1.5cm

## 2020-09-03 ENCOUNTER — Ambulatory Visit: Payer: Medicare Other | Admitting: Dermatology

## 2020-09-03 ENCOUNTER — Encounter: Payer: Self-pay | Admitting: Dermatology

## 2020-09-03 ENCOUNTER — Other Ambulatory Visit: Payer: Self-pay

## 2020-09-03 DIAGNOSIS — Z1283 Encounter for screening for malignant neoplasm of skin: Secondary | ICD-10-CM | POA: Diagnosis not present

## 2020-09-03 DIAGNOSIS — L570 Actinic keratosis: Secondary | ICD-10-CM | POA: Diagnosis not present

## 2020-09-03 DIAGNOSIS — Z85828 Personal history of other malignant neoplasm of skin: Secondary | ICD-10-CM

## 2020-09-03 DIAGNOSIS — L821 Other seborrheic keratosis: Secondary | ICD-10-CM

## 2020-09-03 NOTE — Patient Instructions (Addendum)
Routine follow-up for Peter Morgan date of birth [birth year].  He has had more than his share of difficulty with previous nonmelanoma skin cancers and was concerned about spots on the left side, left side of the nose, left calf.  The lesions on the calf and the left side are clinically benign keratoses.  There is some residual after treating left side of his nose which is thin pink scale which will probably respond either to freezing or cream, but he is leaving for a family gathering in [place] in a few days and we agreed that these could this can be done at a follow-up visit in late October.  Should he have any acute needs before that time I am happy to see him.

## 2020-10-04 NOTE — Progress Notes (Signed)
   Follow-Up Visit   Subjective  Peter Morgan is a 74 y.o. male who presents for the following: Skin Problem (spot on near left shoulder blade--spot on left leg--spot on left side nostril--wanted to make sure these spot not concern--noticed these for years).  Annual skin exam Location:  Duration:  Quality:  Associated Signs/Symptoms: Modifying Factors:  Severity:  Timing: Context:   Objective  Well appearing patient in no apparent distress; mood and affect are within normal limits.  A full examination was performed including scalp, head, eyes, ears, nose, lips, neck, chest, axillae, abdomen, back, buttocks, bilateral upper extremities, bilateral lower extremities, hands, feet, fingers, toes, fingernails, and toenails. All findings within normal limits unless otherwise noted below.   Assessment & Plan    Routine follow-up for Peter Morgan date of birth 1946/05/18.  He has had more than her share difficulty with previous nonmelanoma skin cancers and was concerned about spots on the left side, left side of the nose, left calf.  The lesions on the calf and the left side are clinically benign keratoses.  There is some residual after treating left side of his nose which is thin pink scale which will probably respond either to freezing or cream, but he is leaving for a family gathering in [place] in a few days and we agreed that these could this can be done at a follow-up visit in late October.  Should he have any acute needs before that time I am happy to see him.   Skin cancer screening performed today.    Seborrheic keratosis (3) Left Lower Leg - Posterior; Left Flank; Left Nasal Sidewall  No treatment needed.   AK (actinic keratosis) Left Nasal Sidewall  Patient deferred this treatment until he got back from out of town. He can make a visit to have lesion treated.       I, Lavonna Monarch, MD, have reviewed all documentation for this visit.  The documentation on  10/04/20 for the exam, diagnosis, procedures, and orders are all accurate and complete.

## 2020-10-06 ENCOUNTER — Encounter: Payer: Self-pay | Admitting: Dermatology

## 2020-10-10 ENCOUNTER — Ambulatory Visit: Payer: Medicare Other | Admitting: Dermatology

## 2020-10-10 ENCOUNTER — Encounter: Payer: Self-pay | Admitting: Dermatology

## 2020-10-10 ENCOUNTER — Other Ambulatory Visit: Payer: Self-pay

## 2020-10-10 DIAGNOSIS — L570 Actinic keratosis: Secondary | ICD-10-CM

## 2020-10-10 DIAGNOSIS — Z1283 Encounter for screening for malignant neoplasm of skin: Secondary | ICD-10-CM

## 2020-10-10 DIAGNOSIS — Z85828 Personal history of other malignant neoplasm of skin: Secondary | ICD-10-CM | POA: Diagnosis not present

## 2020-10-18 ENCOUNTER — Encounter: Payer: Self-pay | Admitting: Podiatry

## 2020-10-18 ENCOUNTER — Other Ambulatory Visit: Payer: Self-pay

## 2020-10-18 ENCOUNTER — Ambulatory Visit: Payer: Medicare Other | Admitting: Podiatry

## 2020-10-18 DIAGNOSIS — M79674 Pain in right toe(s): Secondary | ICD-10-CM | POA: Diagnosis not present

## 2020-10-18 DIAGNOSIS — M2041 Other hammer toe(s) (acquired), right foot: Secondary | ICD-10-CM | POA: Diagnosis not present

## 2020-10-18 DIAGNOSIS — L84 Corns and callosities: Secondary | ICD-10-CM

## 2020-10-18 NOTE — Progress Notes (Signed)
  Subjective:  Patient ID: Peter Morgan, male    DOB: 1946/04/18,  MRN: [id number]  Chief Complaint  Patient presents with  . Callouses    right foot between 3rd and 4th toe. PT stated that it comes and goes but never completely goes away     74 y.o. male presents with the above complaint. History confirmed with patient.  Most painful on the medial fourth toe.  He also has a painful callus on the medial side of the hallux and instep of his foot  Objective:  Physical Exam: warm, good capillary refill, no trophic changes or ulcerative lesions, normal DP and PT pulses and normal sensory exam.  Heloma molle on the medial side of the distal portion of the fourth digit just proximal to the nail.  Painful to palpation.  He has semireducible hammertoe contracture with adductovarus rotation of the fourth and fifth toes.  There is a pinch callus on the medial hallux as well as the medial first MTPJ Assessment:   1. Hammertoe of right foot   2. Callus of foot   3. Heloma molle   4. Pain in toe of right foot      Plan:  Patient was evaluated and treated and all questions answered.  All symptomatic hyperkeratoses were safely debrided with a sterile #15 blade to patient's level of comfort without incident. We discussed preventative and palliative care of these lesions including supportive and accommodative shoegear, padding, prefabricated and custom molded accommodative orthoses, use of a pumice stone and lotions/creams daily.   I discussed with him how his hammertoes contribute to the formation of these calluses.  I recommend we offload these with a silicone pad and these were dispensed today.  Information was given on where to get more these.  He will also consider using urea cream.  Also discussed if these are unsuccessful we could consider surgical correction of these but he would prefer to avoid this and I think this is reasonable.  Return if symptoms worsen or fail to improve.

## 2020-10-18 NOTE — Patient Instructions (Addendum)
Look for urea 40% cream or ointment and apply to the thickened dry skin / calluses. This can be bought over the counter, at a pharmacy or online such as [employer].   More silicone pads can be purchased from:  [email or web address].[email or web address]       Corns and Calluses Corns are small areas of thickened skin that occur on the top, sides, or tip of a toe. They contain a cone-shaped core with a point that can press on a nerve below. This causes pain.  Calluses are areas of thickened skin that can occur anywhere on the body, including the hands, fingers, palms, soles of the feet, and heels. Calluses are usually larger than corns. What are the causes? Corns and calluses are caused by rubbing (friction) or pressure, such as from shoes that are too tight or do not fit properly. What increases the risk? Corns are more likely to develop in people who have misshapen toes (toe deformities), such as hammer toes. Calluses can occur with friction to any area of the skin. They are more likely to develop in people who:  Work with their hands.  Wear shoes that fit poorly, are too tight, or are high-heeled.  Have toe deformities. What are the signs or symptoms? Symptoms of a corn or callus include:  A hard growth on the skin.  Pain or tenderness under the skin.  Redness and swelling.  Increased discomfort while wearing tight-fitting shoes, if your feet are affected. If a corn or callus becomes infected, symptoms may include:  Redness and swelling that gets worse.  Pain.  Fluid, blood, or pus draining from the corn or callus. How is this diagnosed? Corns and calluses may be diagnosed based on your symptoms, your medical history, and a physical exam. How is this treated? Treatment for corns and calluses may include:  Removing the cause of the friction or pressure. This may involve: ? Changing your shoes. ? Wearing shoe inserts (orthotics) or other protective layers in your shoes, such as  a corn pad. ? Wearing gloves.  Applying medicine to the skin (topical medicine) to help soften skin in the hardened, thickened areas.  Removing layers of dead skin with a file to reduce the size of the corn or callus.  Removing the corn or callus with a scalpel or laser.  Taking antibiotic medicines, if your corn or callus is infected.  Having surgery, if a toe deformity is the cause. Follow these instructions at home:   Take over-the-counter and prescription medicines only as told by your health care provider.  If you were prescribed an antibiotic, take it as told by your health care provider. Do not stop taking it even if your condition starts to improve.  Wear shoes that fit well. Avoid wearing high-heeled shoes and shoes that are too tight or too loose.  Wear any padding, protective layers, gloves, or orthotics as told by your health care provider.  Soak your hands or feet and then use a file or pumice stone to soften your corn or callus. Do this as told by your health care provider.  Check your corn or callus every day for symptoms of infection. Contact a health care provider if you:  Notice that your symptoms do not improve with treatment.  Have redness or swelling that gets worse.  Notice that your corn or callus becomes painful.  Have fluid, blood, or pus coming from your corn or callus.  Have new symptoms. Summary  Corns are small areas of thickened skin  that occur on the top, sides, or tip of a toe.  Calluses are areas of thickened skin that can occur anywhere on the body, including the hands, fingers, palms, and soles of the feet. Calluses are usually larger than corns.  Corns and calluses are caused by rubbing (friction) or pressure, such as from shoes that are too tight or do not fit properly.  Treatment may include wearing any padding, protective layers, gloves, or orthotics as told by your health care provider. This information is not intended to replace  advice given to you by your health care provider. Make sure you discuss any questions you have with your health care provider. Document Revised: 03/23/2019 Document Reviewed: 10/14/2017 Elsevier Patient Education  2020 [employer].

## 2020-12-10 ENCOUNTER — Encounter: Payer: Self-pay | Admitting: Dermatology

## 2020-12-10 ENCOUNTER — Ambulatory Visit: Payer: Medicare Other | Admitting: Dermatology

## 2020-12-10 ENCOUNTER — Other Ambulatory Visit: Payer: Self-pay

## 2020-12-10 DIAGNOSIS — D045 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: Secondary | ICD-10-CM | POA: Diagnosis not present

## 2020-12-10 DIAGNOSIS — D0462 Carcinoma in situ of skin of left upper limb, including shoulder: Secondary | ICD-10-CM | POA: Diagnosis not present

## 2020-12-10 DIAGNOSIS — D0439 Carcinoma in situ of skin of other parts of face: Secondary | ICD-10-CM | POA: Diagnosis not present

## 2020-12-10 DIAGNOSIS — D485 Neoplasm of uncertain behavior of skin: Secondary | ICD-10-CM

## 2020-12-10 DIAGNOSIS — L570 Actinic keratosis: Secondary | ICD-10-CM

## 2020-12-10 DIAGNOSIS — L820 Inflamed seborrheic keratosis: Secondary | ICD-10-CM

## 2020-12-10 DIAGNOSIS — D2239 Melanocytic nevi of other parts of face: Secondary | ICD-10-CM

## 2020-12-10 DIAGNOSIS — C4492 Squamous cell carcinoma of skin, unspecified: Secondary | ICD-10-CM

## 2020-12-10 HISTORY — DX: Squamous cell carcinoma of skin, unspecified: C44.92

## 2020-12-10 NOTE — Patient Instructions (Signed)

## 2020-12-14 ENCOUNTER — Encounter: Payer: Self-pay | Admitting: Dermatology

## 2020-12-14 NOTE — Progress Notes (Signed)
Follow-Up Visit   Subjective  Peter Morgan is a 74 y.o. male who presents for the following: Follow-up (Nose black spot, lower back, right arm, chest left side, right arm upper new crust).  Multiple new crusts Location: Face, torso, arm Duration:  Quality:  Associated Signs/Symptoms: Modifying Factors:  Severity:  Timing: Context: History of multiple skin cancers  Objective  Well appearing patient in no apparent distress; mood and affect are within normal limits. Objective  Right Chest: 40mm tan-pink slightly inflamed crust  Objective  Left Nasal Sidewall: 6 mm pink crust     Objective  Left Malar Cheek: 8 mm thick crust       Objective  Right Forearm - Posterior: 4 mm horny pink crust  Objective  Left Lower Back: Pink crust       Objective  Left Inner Shoulder: Pink crust         All skin waist up examined.   Assessment & Plan    Inflamed seborrheic keratosis Right Chest  We will leave if this is clinically stable  Neoplasm of uncertain behavior of skin (2) Left Nasal Sidewall  Skin / nail biopsy Type of biopsy: tangential   Informed consent: discussed and consent obtained   Timeout: patient name, date of birth, surgical site, and procedure verified   Procedure prep:  Patient was prepped and draped in usual sterile fashion (Non sterile) Prep type:  Chlorhexidine Anesthesia: the lesion was anesthetized in a standard fashion   Anesthetic:  1% lidocaine w/ epinephrine 1-100,000 local infiltration Instrument used: flexible razor blade   Hemostasis achieved with: ferric subsulfate   Outcome: patient tolerated procedure well   Post-procedure details: sterile dressing applied and wound care instructions given   Dressing type: bandage and petrolatum    Specimen 3 - Surgical pathology Differential Diagnosis: R/O BCC vs SCC  Check Margins: No  Left Malar Cheek  Skin / nail biopsy Type of biopsy: tangential   Informed  consent: discussed and consent obtained   Timeout: patient name, date of birth, surgical site, and procedure verified   Procedure prep:  Patient was prepped and draped in usual sterile fashion (Non sterile) Prep type:  Chlorhexidine Anesthesia: the lesion was anesthetized in a standard fashion   Anesthetic:  1% lidocaine w/ epinephrine 1-100,000 local infiltration Instrument used: flexible razor blade   Hemostasis achieved with: ferric subsulfate   Outcome: patient tolerated procedure well   Post-procedure details: sterile dressing applied and wound care instructions given   Dressing type: bandage and petrolatum    Specimen 4 - Surgical pathology Differential Diagnosis: R/O BCC vs SCC  Check Margins: No  AK (actinic keratosis) Right Forearm - Posterior  Destruction of lesion - Right Forearm - Posterior Complexity: simple   Destruction method: cryotherapy   Informed consent: discussed and consent obtained   Timeout:  patient name, date of birth, surgical site, and procedure verified Lesion destroyed using liquid nitrogen: Yes   Cryotherapy cycles:  5 Outcome: patient tolerated procedure well with no complications   Post-procedure details: wound care instructions given    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 (2) Left Lower Back  Destruction of lesion Complexity: simple   Destruction method: electrodesiccation and curettage   Informed consent: discussed and consent obtained   Timeout:  patient name, date of birth, surgical site, and procedure verified Anesthesia: the lesion was anesthetized in a standard fashion   Anesthetic:  1% lidocaine w/ epinephrine 1-100,000 local infiltration Curettage performed in three different  directions: Yes     Electrodesiccation performed over the curetted area: No   Curettage cycles:  1 Lesion length (cm):  1.2 Lesion width (cm):  1.2 Margin per side (cm):  0 Final wound size (cm):  1.2 Hemostasis achieved with:  ferric subsulfate Outcome:  patient tolerated procedure well with no complications   Post-procedure details: sterile dressing applied and wound care instructions given   Dressing type: bandage and petrolatum    Specimen 1 - Surgical pathology Differential Diagnosis: R/O BCC vs SCC  Check Margins: No  Treatment after biopsy  Left Inner Shoulder  Destruction of lesion Complexity: simple   Destruction method: electrodesiccation and curettage   Informed consent: discussed and consent obtained   Timeout:  patient name, date of birth, surgical site, and procedure verified Anesthesia: the lesion was anesthetized in a standard fashion   Anesthetic:  1% lidocaine w/ epinephrine 1-100,000 local infiltration Curettage performed in three different directions: Yes     Electrodesiccation performed over the curetted area: No   Curettage cycles:  1 Lesion length (cm):  1.6 Lesion width (cm):  1.6 Margin per side (cm):  0 Final wound size (cm):  1.6 Hemostasis achieved with:  ferric subsulfate Outcome: patient tolerated procedure well with no complications   Post-procedure details: sterile dressing applied and wound care instructions given   Dressing type: bandage and petrolatum    Specimen 2 - Surgical pathology Differential Diagnosis: R/O BCC vs SCC  Check Margins: No  Treatment after biopsy      I, Lavonna Monarch, MD, have reviewed all documentation for this visit.  The documentation on 12/14/20 for the exam, diagnosis, procedures, and orders are all accurate and complete.

## 2020-12-18 ENCOUNTER — Encounter: Payer: Self-pay | Admitting: Dermatology

## 2020-12-18 ENCOUNTER — Telehealth: Payer: Self-pay | Admitting: Dermatology

## 2020-12-18 NOTE — Telephone Encounter (Signed)
Phone call to patient with his pathology results. Patient aware.  

## 2020-12-18 NOTE — Telephone Encounter (Signed)
-----   Message from Janalyn Harder, MD sent at 12/14/2020  8:59 AM EST ----- #1  And #2 treated.  Schedule 30 minutes with Dr. Jorja Loa #4

## 2020-12-18 NOTE — Telephone Encounter (Signed)
Patient calling for results of BX. He would like a call back after lunch time today.

## 2021-02-14 ENCOUNTER — Encounter: Payer: Medicare Other | Admitting: Dermatology

## 2021-03-21 ENCOUNTER — Other Ambulatory Visit: Payer: Self-pay

## 2021-03-21 ENCOUNTER — Encounter: Payer: Self-pay | Admitting: Dermatology

## 2021-03-21 ENCOUNTER — Ambulatory Visit (INDEPENDENT_AMBULATORY_CARE_PROVIDER_SITE_OTHER): Payer: Medicare Other | Admitting: Dermatology

## 2021-03-21 DIAGNOSIS — C44329 Squamous cell carcinoma of skin of other parts of face: Secondary | ICD-10-CM

## 2021-03-21 DIAGNOSIS — C4492 Squamous cell carcinoma of skin, unspecified: Secondary | ICD-10-CM

## 2021-03-21 DIAGNOSIS — L570 Actinic keratosis: Secondary | ICD-10-CM

## 2021-03-21 NOTE — Patient Instructions (Signed)

## 2021-04-01 ENCOUNTER — Encounter: Payer: Self-pay | Admitting: Dermatology

## 2021-04-10 NOTE — Progress Notes (Signed)
Cardiology Office Note   Date:  04/11/2021   ID:  Peter Morgan, DOB [birth year], MRN [id number]  PCP:  Peter Hose, NP  Cardiologist:   Minus Breeding, MD   Chief Complaint  Patient presents with  . Coronary Artery Disease      History of Present Illness: Peter Morgan is a 75 y.o. male who presents  of CAD.  He had DES to his OM with nonobstructive disease elsewhere per cardiac catheterization on 12/04/2012.  He returns for one year follow up.  Since I last saw him he has done well.  [place] and he has a [place].  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  He exercises 30 minutes a day.  The patient denies any new symptoms such as chest discomfort, neck or arm discomfort. There has been no new shortness of breath, PND or orthopnea. There have been no reported palpitations, presyncope or syncope.  Unfortunately he just injured his shoulder and had to have an injection.   Past Medical History:  Diagnosis Date  . Anxiety   . Arthritis   . Basal cell carcinoma 12/18/1998   right forehead - CX3 + excision  . Basal cell carcinoma 04/04/1993   post left shoulder - tx p bx  . Basal cell carcinoma 04/04/1993   front, right base of neck - tx p bx  . Basal cell carcinoma 04/20/2006   left side of nose - MOHs  . Basal cell carcinoma 11/23/2013   top of crown - CX3 + 5FU + excision  . Basal cell carcinoma 10/17/2014   left forearm - CX3 + 5FU  . Basal cell carcinoma 05/21/2015   right shoulder - CX3 + 5FU  . Basal cell carcinoma 09/04/2015   left mid line scalp [facility])  . Basal cell carcinoma 09/04/2015   right mid line scalp [facility])  . Basal cell carcinoma 09/04/2015   mid line inf. scalp [facility]  . Basal cell carcinoma 09/04/2015   left upper arm [facility])  . Basal cell carcinoma 11/26/2015   right shoulder - tx p bx  . Basal cell carcinoma 02/27/2016   right ear rim, sup - CX3 + 5FU  . Basal cell carcinoma 04/27/2019   mid forehead, central [facility])  .  Basal cell carcinoma 04/27/2019   left medial scalp [facility])  . Basal cell carcinoma 10/03/2019   right scalp, post [facility])  . Basal cell carcinoma 11/16/2019   right post scalp [facility])  . BPH (benign prostatic hyperplasia)   . CAD (coronary artery disease)    Diagnosed 12/2011 following abnormal treadmill test - high grade OM stenosis s/p DES 01/05/12, mild residual non-obstructive LAD, Lcx, RCA disease.  Normal LV function with EF 55-65% by cath 01/05/12  . GERD (gastroesophageal reflux disease)   . Hyperlipidemia   . Hypertension   . Panic attack   . PONV (postoperative nausea and vomiting)   . SCCA (squamous cell carcinoma) of skin 12/10/2020   Left Lower Back (in situ)  . SCCA (squamous cell carcinoma) of skin 12/10/2020   Left Inner Shoulder (in situ)  . SCCA (squamous cell carcinoma) of skin 12/10/2020   Left Malar Cheek (in situ)  . Squamous cell carcinoma of skin 10/17/2014   bridge of nose - CX3 + 5FU  . Squamous cell carcinoma of skin 05/21/2015   left sideburn - CX3 + 5FU  . Squamous cell carcinoma of skin 05/21/2015   right temple - CX3 +  5FU  . Squamous cell carcinoma of skin 10/25/2015   right eye underneath - tx p bx  . Squamous cell carcinoma of skin 03/27/2016   right side forehead - tx p bx  . Squamous cell carcinoma of skin 04/28/2016   left chest - CX3 + 5FU  . Squamous cell carcinoma of skin 04/28/2016   sternum - CX3 + 5FU  . Squamous cell carcinoma of skin 04/28/2016   right inferior clavicle - CX3 + 5FU  . Squamous cell carcinoma of skin 12/17/2016   right anterior forehead - CX3 + 5FU  . Squamous cell carcinoma of skin 07/13/2018   right upper forehead, inf - CX3 + 5FU  . Squamous cell carcinoma of skin 04/27/2019   right mid forehead - CX3 + 5FU  . Squamous cell carcinoma of skin 03/26/2020   situ- right mid forehead CX3+5FU    Past Surgical History:  Procedure Laterality Date  . APPENDECTOMY    . APPLICATION OF A-CELL OF HEAD/NECK N/A 12/27/2015    Procedure: A-CELL AND VAC PLACEMENT TO SCALP WOUND;  Surgeon: Wallace Going, DO;  Location: [place];  Service: Plastics;  Laterality: N/A;  . APPLICATION OF A-CELL OF HEAD/NECK Left 01/24/2016   Procedure: APPLICATION OF A-CELL;  Surgeon: Wallace Going, DO;  Location: [place];  Service: Plastics;  Laterality: Left;  . APPLICATION OF WOUND VAC N/A 01/24/2016   Procedure: APPLICATION OF WOUND VAC;  Surgeon: Wallace Going, DO;  Location: [place];  Service: Plastics;  Laterality: N/A;  . coronary stents     . FEMUR SURGERY    . JOINT REPLACEMENT    . KNEE ARTHROSCOPY     bilateral  . LEFT HEART CATHETERIZATION WITH CORONARY ANGIOGRAM N/A 01/05/2012   Procedure: LEFT HEART CATHETERIZATION WITH CORONARY ANGIOGRAM;  Surgeon: Minus Breeding, MD;  Location: [facility] CATH LAB;  Service: Cardiovascular;  Laterality: N/A;  . MOHS SURGERY     basal cell on scalp  . NASAL SEPTUM SURGERY    . right middle finger amputation     . SKIN SPLIT GRAFT N/A 01/24/2016   Procedure: SPLIT THICKNESS SKIN GRAFT TO SCALP FROM THIGH;  Surgeon: Wallace Going, DO;  Location: [place];  Service: Plastics;  Laterality: N/A;  . TONSILLECTOMY AND ADENOIDECTOMY    . TOTAL KNEE ARTHROPLASTY Left 07/11/2013   Procedure: LEFT TOTAL KNEE ARTHROPLASTY ;  Surgeon: Gearlean Alf, MD;  Location: [facility];  Service: Orthopedics;  Laterality: Left;  . TOTAL KNEE ARTHROPLASTY Right 07/10/2014   Procedure: RIGHT TOTAL KNEE ARTHROPLASTY;  Surgeon: Gearlean Alf, MD;  Location: [facility];  Service: Orthopedics;  Laterality: Right;     Current Outpatient Medications  Medication Sig Dispense Refill  . acetaminophen (TYLENOL) 500 MG tablet Take 1,000 mg by mouth every 4 (four) hours as needed for pain. Pain    . aspirin 81 MG tablet Take 81 mg by mouth daily.    Marland Kitchen atorvastatin (LIPITOR) 40 MG tablet Take 1 tablet (40 mg total) by mouth daily. 90 tablet 3  .  clonazePAM (KLONOPIN) 0.5 MG tablet TK 1 T PO BID    . diclofenac (VOLTAREN) 75 MG EC tablet Take 75 mg by mouth 2 (two) times daily.    . fluticasone (FLONASE) 50 MCG/ACT nasal spray Place 2 sprays into both nostrils daily.    . metoprolol succinate (TOPROL-XL) 50 MG 24 hr tablet Take 25 mg by mouth every morning.  Take with or immediately following a meal.    . nitroGLYCERIN (NITROSTAT) 0.4 MG SL tablet Place 0.4 mg under the tongue every 5 (five) minutes as needed for chest pain.    . pantoprazole (PROTONIX) 40 MG tablet Take 40 mg by mouth daily.    . tamsulosin (FLOMAX) 0.4 MG CAPS Take 0.4 mg by mouth 2 (two) times daily.      No current facility-administered medications for this visit.    Allergies:   Codeine, Oxycodone, and Tramadol    ROS:  Please see the history of present illness.   Otherwise, review of systems are positive for right shoulder pain, GERD, PTSD, sinus problems.   All other systems are reviewed and negative.    PHYSICAL EXAM: VS:  BP 140/78   Pulse 79   Ht 5\' 10"  (1.778 m)   Wt 180 lb (81.6 kg)   SpO2 98%   BMI 25.83 kg/m  , BMI Body mass index is 25.83 kg/m. GENERAL:  Well appearing NECK:  No jugular venous distention, waveform within normal limits, carotid upstroke brisk and symmetric, no bruits, no thyromegaly LUNGS:  Clear to auscultation bilaterally CHEST:  Unremarkable HEART:  PMI not displaced or sustained,S1 and S2 within normal limits, no S3, no S4, no clicks, no rubs, no murmurs ABD:  Flat, positive bowel sounds normal in frequency in pitch, no bruits, no rebound, no guarding, no midline pulsatile mass, no hepatomegaly, no splenomegaly EXT:  2 plus pulses throughout, no edema, no cyanosis no clubbing   EKG:  EKG is  ordered today. The ekg ordered today demonstrates sinus rhythm, rate 79, axis within normal limits, intervals within normal limits, no acute ST-T wave changes.   Recent Labs: No results found for requested labs within last 8760  hours.    Lipid Panel    Component Value Date/Time   CHOL 157 02/24/2013 0804   TRIG 90.0 02/24/2013 0804   HDL 59.80 02/24/2013 0804   CHOLHDL 3 02/24/2013 0804   VLDL 18.0 02/24/2013 0804   LDLCALC 79 02/24/2013 0804      Wt Readings from Last 3 Encounters:  04/11/21 180 lb (81.6 kg)  04/16/20 182 lb (82.6 kg)  10/10/19 181 lb (82.1 kg)      Other studies Reviewed: Additional studies/ records that were reviewed today include: Labs from [place]. Review of the above records demonstrates:  Please see elsewhere in the note.     ASSESSMENT AND PLAN:  SYNCOPE:   He has had no further syncope.  No further work-up is needed.  This is probably dehydration.   CAD:   He has had no chest discomfort and he remains very active.  He has had none of the symptoms it was his previous angina.   HTN:  The blood pressure is mildly elevated but this is unusual for him and he does get it checked when he goes to the doctor and has been well controlled.  No change in therapy.   DYSLIPIDEMIA: His LDL earlier this year was 10.  This is not at target.  I will increase his Lipitor to 40 mg daily and check liver profile and lipids creatinine 10 weeks.  Current medicines are reviewed at length with the patient today.  The patient does not have concerns regarding medicines.  The following changes have been made:  As above  Labs/ tests ordered today include:     Orders Placed This Encounter  Procedures  . Lipid panel  . Hepatic function panel  .  EKG 12-Lead     Disposition:   FU with me in one year.   Signed, Minus Breeding, MD  04/11/2021 2:16 PM    [facility]

## 2021-04-11 ENCOUNTER — Telehealth: Payer: Self-pay | Admitting: Cardiology

## 2021-04-11 ENCOUNTER — Ambulatory Visit: Payer: Medicare Other | Admitting: Cardiology

## 2021-04-11 ENCOUNTER — Encounter: Payer: Self-pay | Admitting: Cardiology

## 2021-04-11 ENCOUNTER — Other Ambulatory Visit: Payer: Self-pay

## 2021-04-11 VITALS — BP 140/78 | HR 79 | Ht 70.0 in | Wt 180.0 lb

## 2021-04-11 DIAGNOSIS — R55 Syncope and collapse: Secondary | ICD-10-CM | POA: Diagnosis not present

## 2021-04-11 DIAGNOSIS — I2510 Atherosclerotic heart disease of native coronary artery without angina pectoris: Secondary | ICD-10-CM

## 2021-04-11 DIAGNOSIS — I10 Essential (primary) hypertension: Secondary | ICD-10-CM

## 2021-04-11 DIAGNOSIS — E785 Hyperlipidemia, unspecified: Secondary | ICD-10-CM | POA: Diagnosis not present

## 2021-04-11 MED ORDER — ATORVASTATIN CALCIUM 40 MG PO TABS: 40.0000 mg | ORAL_TABLET | Freq: Every day | ORAL | 3 refills | Status: DC

## 2021-04-11 NOTE — Telephone Encounter (Signed)
Pt c/o medication issue:  1. Name of Medication: atorvastatin (LIPITOR) 40 MG tablet  2. How are you currently taking this medication (dosage and times per day)? As written   3. Are you having a reaction (difficulty breathing--STAT)? No   4. What is your medication issue? Prescription needs to be sent to [place], [place], [address]

## 2021-04-11 NOTE — Telephone Encounter (Signed)
Patient needed Atorvastatin 40mg  prescription to be sent to [facility] instead of [facility]. Advised patient that this has been sent in and to call back with any issues, questions, or concerns.   Patient verbalized understanding.

## 2021-04-11 NOTE — Patient Instructions (Signed)
Medication Instructions:  INCREASE- Atorvastatin 40 mg by mouth daily  *If you need a refill on your cardiac medications before your next appointment, please call your pharmacy*   Lab Work: Fasting Lipid and Liver in 10 weeks  If you have labs (blood work) drawn today and your tests are completely normal, you will receive your results only by: Marland Kitchen MyChart Message (if you have MyChart) OR . A paper copy in the mail If you have any lab test that is abnormal or we need to change your treatment, we will call you to review the results.   Testing/Procedures: None Ordered   Follow-Up: At [facility], you and your health needs are our priority.  As part of our continuing mission to provide you with exceptional heart care, we have created designated Provider Care Teams.  These Care Teams include your primary Cardiologist (physician) and Advanced Practice Providers (APPs -  Physician Assistants and Nurse Practitioners) who all work together to provide you with the care you need, when you need it.  We recommend signing up for the patient portal called "MyChart".  Sign up information is provided on this After Visit Summary.  MyChart is used to connect with patients for Virtual Visits (Telemedicine).  Patients are able to view lab/test results, encounter notes, upcoming appointments, etc.  Non-urgent messages can be sent to your provider as well.   To learn more about what you can do with MyChart, go to [email or web address].    Your next appointment:   1 year(s)  The format for your next appointment:   In Person  Provider:   You may see Minus Breeding, MD or one of the following Advanced Practice Providers on your designated Care Team:    Rosaria Ferries, PA-C  Jory Sims, DNP, ANP

## 2021-04-14 NOTE — Progress Notes (Signed)
   Follow-Up Visit   Subjective  Peter Morgan is a 75 y.o. male who presents for the following: Procedure (Patient here today for treatment of CIS x 1 on left malar cheek.).  CIS left cheek Location:  Duration:  Quality:  Associated Signs/Symptoms: Modifying Factors:  Severity:  Timing: Context: For treatment  Objective  Well appearing patient in no apparent distress; mood and affect are within normal limits. Objective  Left Malar Cheek: Biopsy site identified by patient, nurse, and me.  Objective  Right Buccal Cheek (2): 8 mm gritty pink flat crust    A focused examination was performed including Neck.  Head.. Relevant physical exam findings are noted in the Assessment and Plan.   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  Destruction of lesion Complexity: simple   Destruction method: electrodesiccation and curettage   Informed consent: discussed and consent obtained   Timeout:  patient name, date of birth, surgical site, and procedure verified Anesthesia: the lesion was anesthetized in a standard fashion   Anesthetic:  1% lidocaine w/ epinephrine 1-100,000 local infiltration Curettage performed in three different directions: Yes   Curettage cycles:  3 Lesion length (cm):  1.4 Lesion width (cm):  1.4 Margin per side (cm):  0 Final wound size (cm):  1.4 Hemostasis achieved with:  ferric subsulfate Outcome: patient tolerated procedure well with no complications   Additional details:  Wound innoculated with 5 fluorouracil solution.  AK (actinic keratosis) (2) Right Buccal Cheek  Destruction of lesion - Right Buccal Cheek Complexity: simple   Destruction method: cryotherapy   Informed consent: discussed and consent obtained   Timeout:  patient name, date of birth, surgical site, and procedure verified Lesion destroyed using liquid nitrogen: Yes   Cryotherapy cycles:  3 Outcome: patient tolerated procedure well with no  complications        I, Lavonna Monarch, MD, have reviewed all documentation for this visit.  The documentation on 04/14/21 for the exam, diagnosis, procedures, and orders are all accurate and complete.

## 2021-06-04 LAB — HEPATIC FUNCTION PANEL
ALT: 17 IU/L (ref 0–44)
AST: 22 IU/L (ref 0–40)
Albumin: 4.5 g/dL (ref 3.7–4.7)
Alkaline Phosphatase: 56 IU/L (ref 44–121)
Bilirubin Total: 0.5 mg/dL (ref 0.0–1.2)
Bilirubin, Direct: 0.15 mg/dL (ref 0.00–0.40)
Total Protein: 6.7 g/dL (ref 6.0–8.5)

## 2021-06-04 LAB — LIPID PANEL
Chol/HDL Ratio: 3 ratio (ref 0.0–5.0)
Cholesterol, Total: 170 mg/dL (ref 100–199)
HDL: 56 mg/dL (ref >39)
LDL Chol Calc (NIH): 99 mg/dL (ref 0–99)
Triglycerides: 77 mg/dL (ref 0–149)
VLDL Cholesterol Cal: 15 mg/dL (ref 5–40)

## 2021-06-07 ENCOUNTER — Telehealth: Payer: Self-pay | Admitting: *Deleted

## 2021-06-07 DIAGNOSIS — E785 Hyperlipidemia, unspecified: Secondary | ICD-10-CM

## 2021-06-07 MED ORDER — ATORVASTATIN CALCIUM 80 MG PO TABS: 80.0000 mg | ORAL_TABLET | Freq: Every day | ORAL | 3 refills | Status: DC

## 2021-06-07 NOTE — Telephone Encounter (Signed)
pt aware of results  New script sent to the pharmacy  Lab orders mailed to the pt  

## 2021-07-15 ENCOUNTER — Encounter: Payer: Self-pay | Admitting: Dermatology

## 2021-07-15 NOTE — Progress Notes (Signed)
   Follow-Up Visit   Subjective  Peter Morgan is a 75 y.o. male who presents for the following: Follow-up (6 MO F/U FOREHEAD CRUST).  General skin check, multiple new small crusts on face and scalp Location:  Duration:  Quality:  Associated Signs/Symptoms: Modifying Factors:  Severity:  Timing: Context:   Objective  Well appearing patient in no apparent distress; mood and affect are within normal limits. Chest - Medial (Center) SEE HISTORY BCC SCC.  No sign of recurrence or new nonmelanoma skin cancer or atypical pigmented spots.     Mid Forehead (5), Mid Occipital Scalp (5) Dozen gritty plus hornlike 2 to 4 mm pink crusts    All skin waist up examined.   Assessment & Plan    Screening exam for skin cancer Chest - Medial [facility])  Annual skin examination, patient encouraged to self examine with spouse twice annually.  AK (actinic keratosis) (10) Mid Occipital Scalp (5); Mid Forehead (5)  Destruction of lesion - Mid Forehead, Mid Occipital Scalp Complexity: simple   Destruction method: cryotherapy   Informed consent: discussed and consent obtained   Timeout:  patient name, date of birth, surgical site, and procedure verified Lesion destroyed using liquid nitrogen: Yes   Cryotherapy cycles:  5 Outcome: patient tolerated procedure well with no complications   Post-procedure details: wound care instructions given        I, Lavonna Monarch, MD, have reviewed all documentation for this visit.  The documentation on 07/15/21 for the exam, diagnosis, procedures, and orders are all accurate and complete.

## 2021-08-11 LAB — COLOGUARD: COLOGUARD: POSITIVE — AB

## 2021-09-11 ENCOUNTER — Encounter: Payer: Self-pay | Admitting: *Deleted

## 2021-09-20 ENCOUNTER — Telehealth: Payer: Self-pay

## 2021-09-20 NOTE — Telephone Encounter (Signed)
   [facility] Pre-operative Risk Assessment    Patient Name: Peter Morgan  DOB: 1946/06/23 MRN: [id number]   Request for surgical clearance:  What type of surgery is being performed  Colonoscopy  When is this surgery scheduled 10/24/21  What type of clearance is required Pharmacy  Are there any medications that need to be held prior to surgery and how long  Aspirin   Practice name and name of physician performing surgery [facility]  Dr.Buccini  What is the office phone number        [phone]   7.   What is the office fax number        [phone]  8.   Anesthesia type Propofol   Thedore Mins Yailyn Strack 09/20/2021, 10:06 AM  _________________________________________________________________   (provider comments below)

## 2021-09-23 ENCOUNTER — Ambulatory Visit: Payer: Medicare Other | Admitting: Dermatology

## 2021-09-24 NOTE — Telephone Encounter (Signed)
Left a message for the patient to call back and speak to the on-call preop APP of the day 

## 2021-09-25 ENCOUNTER — Telehealth: Payer: Self-pay

## 2021-09-25 NOTE — Telephone Encounter (Signed)
   [facility] Pre-operative Risk Assessment    Patient Name: Peter Morgan  DOB: 02/23/1946 MRN: [id number]  Request for surgical clearance:  What type of surgery is being performed? COLONOSCOPY  When is this surgery scheduled? 10-24-2021  What type of clearance is required (medical clearance vs. Pharmacy clearance to hold med vs. Both)? PHARMACY  Are there any medications that need to be held prior to surgery and how long? PLAVIX 5 DAYS BEFORE  Practice name and name of physician performing surgery? [facility]   What is the office phone number? [phone]   7.   What is the office fax number? [phone]  8.   Anesthesia type (None, local, MAC, general) ? PROPOFOL   Waylan Rocher 09/25/2021, 12:25 PM  _________________________________________________________________   (provider comments below)

## 2021-09-25 NOTE — Telephone Encounter (Signed)
This is a duplicated cardiac clearance request for the same procedure.  Please see the other cardiac clearance

## 2021-09-25 NOTE — Telephone Encounter (Signed)
Left a message for the patient to call back and speak to the on-call preop APP of the day 

## 2021-09-27 NOTE — Telephone Encounter (Signed)
Peter Morgan. Bubolz 75 year old male is requesting preoperative cardiac evaluation for colonoscopy.  He was last seen in the clinic on 04/11/2021.  During that time he denied chest pain or shortness of breath.  He continued to be very active at his cabin in [place].  He reported occasional dizziness with bending over but not with increased physical activity.  Has PMH includes coronary artery disease status post PCI with DES to his OM in 2003.  His PMH also includes hypertension, hyperlipidemia, anxiety, and arthritis.  May his aspirin be held prior to his procedure?  Thank you for your help.  Please direct your response to [facility] preop pool.  Jossie Ng. Arn Mcomber NP-C    09/27/2021, 12:32 PM [place] [address] Office [phone] Fax [phone]

## 2021-10-02 NOTE — Telephone Encounter (Signed)
   Primary Cardiologist: Minus Breeding, MD  Chart reviewed as part of pre-operative protocol coverage. Given past medical history and time since last visit, based on ACC/AHA guidelines, DONZEL ROMACK would be at acceptable risk for the planned procedure without further cardiovascular testing.   Dr. Percival Spanish has reviewed the preoperative request and would like Mr. Balthaser to continue his aspirin throughout his colonoscopy.   I will route this recommendation to the requesting party via Epic fax function and remove from pre-op pool.  Please call with questions.  Jossie Ng. Briunna Leicht NP-C    10/02/2021, 7:41 AM [place] [address] Office [phone] Fax [phone]

## 2021-10-02 NOTE — Telephone Encounter (Signed)
Peter Morgan with Dr. Bucchini's office is following up. She states the medication they are inquiring about the patient holding is Plavix. Please resubmit clearance recommendation with advisement regarding holding Plavix.  Phone#: [phone] Fax#: [phone]

## 2021-10-02 NOTE — Telephone Encounter (Signed)
   Primary Cardiologist: Minus Breeding, MD  Chart reviewed as part of pre-operative protocol coverage. Given past medical history and time since last visit, based on ACC/AHA guidelines, Peter Morgan would be at acceptable risk for the planned procedure without further cardiovascular testing.   Patient's medication list has been reviewed.  He is not currently taking Plavix according to his medication list.  Dr. Percival Spanish would like him to continue his aspirin throughout his procedure.    I will route this recommendation to the requesting party via Epic fax function and remove from pre-op pool.  Please call with questions.  Jossie Ng. Zi Newbury NP-C    10/02/2021, 10:13 AM [place] [address] Office [phone] Fax [phone]

## 2021-12-03 ENCOUNTER — Other Ambulatory Visit: Payer: Self-pay

## 2021-12-03 ENCOUNTER — Encounter: Payer: Self-pay | Admitting: Dermatology

## 2021-12-03 ENCOUNTER — Ambulatory Visit: Payer: Medicare Other | Admitting: Dermatology

## 2021-12-03 DIAGNOSIS — D485 Neoplasm of uncertain behavior of skin: Secondary | ICD-10-CM

## 2021-12-03 DIAGNOSIS — D0439 Carcinoma in situ of skin of other parts of face: Secondary | ICD-10-CM | POA: Diagnosis not present

## 2021-12-03 DIAGNOSIS — Z1283 Encounter for screening for malignant neoplasm of skin: Secondary | ICD-10-CM

## 2021-12-03 DIAGNOSIS — L821 Other seborrheic keratosis: Secondary | ICD-10-CM

## 2021-12-03 DIAGNOSIS — L570 Actinic keratosis: Secondary | ICD-10-CM

## 2021-12-03 NOTE — Patient Instructions (Signed)
Patient will call in 1 wk for results

## 2021-12-10 ENCOUNTER — Telehealth: Payer: Self-pay

## 2021-12-10 NOTE — Telephone Encounter (Signed)
Patient already has a surgery visit 2/2 for 30 minutes

## 2021-12-10 NOTE — Telephone Encounter (Signed)
-----   Message from Lavonna Monarch, MD sent at 12/05/2021  5:42 PM EST ----- Schedule surgery with Dr. Darene Lamer

## 2021-12-29 ENCOUNTER — Encounter: Payer: Self-pay | Admitting: Dermatology

## 2021-12-29 NOTE — Progress Notes (Signed)
Follow-Up Visit   Subjective  Peter Morgan is a 76 y.o. male who presents for the following: Annual Exam (Scaly spots on forehead).  General skin check, multiple new crusts Location:  Duration:  Quality:  Associated Signs/Symptoms: Modifying Factors:  Severity:  Timing: Context:   Objective  Well appearing patient in no apparent distress; mood and affect are within normal limits. Upper Body Waist up skin examination: No atypical pigmented spots or recurrent nonmelanoma skin cancer.  At least for possible new superficial carcinoma will be biopsied.  Multiple 3 to 10 mm tan to brown textured flattopped papules with compatible dermoscopy   Left Arm (4), Right Arm (3) Half dozen hornlike crusts on arms   Left Ala Nasi Multiple new 6 to 12 mm thick crusts which are likely superficial carcinoma .  For larger spots will be biopsied.         Left Forehead     Right Forehead       Mid Forehead         All skin waist up examined.   Assessment & Plan    Screening exam for skin cancer Upper Body  Annual skin examination  Seborrheic keratosis  Brnign, ok to leave if stable  Actinic keratosis (7) Left Arm (4); Right Arm (3)  Destruction of lesion - Left Arm, Right Arm Complexity: simple   Destruction method: cryotherapy   Informed consent: discussed and consent obtained   Lesion destroyed using liquid nitrogen: Yes   Cryotherapy cycles:  3 Outcome: patient tolerated procedure well with no complications   Post-procedure details: wound care instructions given    Neoplasm of uncertain behavior of skin (4) Left Ala Nasi  Skin / nail biopsy Type of biopsy: tangential   Informed consent: discussed and consent obtained   Timeout: patient name, date of birth, surgical site, and procedure verified   Anesthesia: the lesion was anesthetized in a standard fashion   Anesthetic:  1% lidocaine w/ epinephrine 1-100,000 local  infiltration Instrument used: flexible razor blade   Hemostasis achieved with: ferric subsulfate   Outcome: patient tolerated procedure well   Post-procedure details: sterile dressing applied and wound care instructions given   Dressing type: bandage and petrolatum    Specimen 1 - Surgical pathology Differential Diagnosis: bcc vs scc  Check Margins: No  Left Forehead  Skin / nail biopsy Type of biopsy: tangential   Informed consent: discussed and consent obtained   Timeout: patient name, date of birth, surgical site, and procedure verified   Anesthesia: the lesion was anesthetized in a standard fashion   Anesthetic:  1% lidocaine w/ epinephrine 1-100,000 local infiltration Instrument used: flexible razor blade   Hemostasis achieved with: ferric subsulfate   Outcome: patient tolerated procedure well   Post-procedure details: sterile dressing applied and wound care instructions given   Dressing type: bandage and petrolatum    Specimen 2 - Surgical pathology Differential Diagnosis: bcc vs scc  Check Margins: No  Right Forehead  Skin / nail biopsy Type of biopsy: tangential   Informed consent: discussed and consent obtained   Timeout: patient name, date of birth, surgical site, and procedure verified   Anesthesia: the lesion was anesthetized in a standard fashion   Anesthetic:  1% lidocaine w/ epinephrine 1-100,000 local infiltration Instrument used: flexible razor blade   Hemostasis achieved with: ferric subsulfate   Outcome: patient tolerated procedure well   Post-procedure details: sterile dressing applied and wound care instructions given   Dressing type: bandage and  petrolatum    Specimen 3 - Surgical pathology Differential Diagnosis: bcc vs scc  Check Margins: No  Mid Forehead  Skin / nail biopsy Type of biopsy: tangential   Informed consent: discussed and consent obtained   Timeout: patient name, date of birth, surgical site, and procedure verified    Anesthesia: the lesion was anesthetized in a standard fashion   Anesthetic:  1% lidocaine w/ epinephrine 1-100,000 local infiltration Instrument used: flexible razor blade   Hemostasis achieved with: ferric subsulfate   Outcome: patient tolerated procedure well   Post-procedure details: sterile dressing applied and wound care instructions given   Dressing type: bandage and petrolatum    Specimen 4 - Surgical pathology Differential Diagnosis: bcc vs scc  Check Margins: No      I, Lavonna Monarch, MD, have reviewed all documentation for this visit.  The documentation on 12/29/21 for the exam, diagnosis, procedures, and orders are all accurate and complete.

## 2022-01-16 ENCOUNTER — Encounter: Payer: Self-pay | Admitting: Dermatology

## 2022-01-16 ENCOUNTER — Other Ambulatory Visit: Payer: Self-pay

## 2022-01-16 ENCOUNTER — Ambulatory Visit (INDEPENDENT_AMBULATORY_CARE_PROVIDER_SITE_OTHER): Payer: PPO | Admitting: Dermatology

## 2022-01-16 DIAGNOSIS — L570 Actinic keratosis: Secondary | ICD-10-CM

## 2022-01-16 DIAGNOSIS — D0439 Carcinoma in situ of skin of other parts of face: Secondary | ICD-10-CM | POA: Diagnosis not present

## 2022-01-16 DIAGNOSIS — D099 Carcinoma in situ, unspecified: Secondary | ICD-10-CM

## 2022-01-16 DIAGNOSIS — L989 Disorder of the skin and subcutaneous tissue, unspecified: Secondary | ICD-10-CM

## 2022-01-16 NOTE — Patient Instructions (Signed)

## 2022-02-08 ENCOUNTER — Encounter: Payer: Self-pay | Admitting: Dermatology

## 2022-02-08 NOTE — Progress Notes (Signed)
Follow-Up Visit   Subjective  Peter Morgan is a 76 y.o. male who presents for the following: Procedure (Patient here today for treatment of CIS x 4 left ala nasi, left forehead, right forehead, and mid forehead.).  Four biopsy-proven CIS plus check other crusts Location:  Duration:  Quality:  Associated Signs/Symptoms: Modifying Factors:  Severity:  Timing: Context:   Objective  Well appearing patient in no apparent distress; mood and affect are within normal limits. Left Ala Nasi Biopsy site identified by patient, nurse, and me  Mid Forehead Biopsy site identified by patient, nurse, and me  Right Forehead Biopsy site identified by patient, nurse, and me.  Left Forehead Biopsy site identified by patient, nurse, and me.  Left Postauricular Area Focally eroded 7 mm waxy crust on back of left ear     Right Mid Helix Hornlike 4 mm pink crust    A focused examination was performed including head and neck. Relevant physical exam findings are noted in the Assessment and Plan.   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  Destruction of lesion Complexity: simple   Destruction method: electrodesiccation and curettage   Informed consent: discussed and consent obtained   Timeout:  patient name, date of birth, surgical site, and procedure verified Anesthesia: the lesion was anesthetized in a standard fashion   Anesthetic:  1% lidocaine w/ epinephrine 1-100,000 local infiltration Curettage performed in three different directions: Yes   Curettage cycles:  3 Lesion length (cm):  1.5 Lesion width (cm):  1.5 Margin per side (cm):  0.1 Final wound size (cm):  1.7 Hemostasis achieved with:  ferric subsulfate Outcome: patient tolerated procedure well with no complications   Post-procedure details: sterile dressing applied and wound care instructions given   Dressing type: bandage and petrolatum   Additional details:  Wound  inoculated with 5% Fluorouracil.    Mid Forehead  Destruction of lesion Complexity: simple   Destruction method: electrodesiccation and curettage   Informed consent: discussed and consent obtained   Timeout:  patient name, date of birth, surgical site, and procedure verified Anesthesia: the lesion was anesthetized in a standard fashion   Anesthetic:  1% lidocaine w/ epinephrine 1-100,000 local infiltration Curettage performed in three different directions: Yes   Curettage cycles:  3 Lesion length (cm):  1.5 Lesion width (cm):  1.5 Margin per side (cm):  0 Final wound size (cm):  1.5 Hemostasis achieved with:  ferric subsulfate Outcome: patient tolerated procedure well with no complications   Post-procedure details: sterile dressing applied and wound care instructions given   Dressing type: bandage and petrolatum   Additional details:  Wound inoculated with 5% Fluorouracil.    Right Forehead  Destruction of lesion Complexity: simple   Destruction method: electrodesiccation and curettage   Informed consent: discussed and consent obtained   Timeout:  patient name, date of birth, surgical site, and procedure verified Anesthesia: the lesion was anesthetized in a standard fashion   Anesthetic:  1% lidocaine w/ epinephrine 1-100,000 local infiltration Curettage performed in three different directions: Yes   Curettage cycles:  3 Lesion length (cm):  2.5 Lesion width (cm):  2.5 Margin per side (cm):  0 Final wound size (cm):  2.5 Hemostasis achieved with:  ferric subsulfate Outcome: patient tolerated procedure well with no complications   Post-procedure details: sterile dressing applied and wound care instructions given   Dressing type: bandage and petrolatum   Additional details:  Wound inoculated  with 5% Fluorouracil.    Left Forehead  Destruction of lesion Complexity: simple   Destruction method: electrodesiccation and curettage   Informed consent: discussed and consent  obtained   Timeout:  patient name, date of birth, surgical site, and procedure verified Anesthesia: the lesion was anesthetized in a standard fashion   Anesthetic:  1% lidocaine w/ epinephrine 1-100,000 local infiltration Curettage performed in three different directions: Yes   Curettage cycles:  3 Lesion length (cm):  1.5 Lesion width (cm):  1.5 Margin per side (cm):  0 Final wound size (cm):  1.5 Hemostasis achieved with:  ferric subsulfate Outcome: patient tolerated procedure well with no complications   Post-procedure details: sterile dressing applied and wound care instructions given   Dressing type: bandage and petrolatum   Additional details:  Wound inoculated with 5% Fluorouracil.    Bumps on skin Left Postauricular Area  TO BE BX ON NEXT DOS  Actinic keratosis Right Mid Helix  Destruction of lesion - Right Mid Helix Complexity: simple   Destruction method: cryotherapy   Informed consent: discussed and consent obtained   Timeout:  patient name, date of birth, surgical site, and procedure verified Lesion destroyed using liquid nitrogen: Yes   Cryotherapy cycles:  3 Outcome: patient tolerated procedure well with no complications   Post-procedure details: wound care instructions given        I, Lavonna Monarch, MD, have reviewed all documentation for this visit.  The documentation on 02/08/22 for the exam, diagnosis, procedures, and orders are all accurate and complete.

## 2022-03-18 ENCOUNTER — Ambulatory Visit: Payer: PPO | Admitting: Dermatology

## 2022-03-18 DIAGNOSIS — L570 Actinic keratosis: Secondary | ICD-10-CM | POA: Diagnosis not present

## 2022-03-18 DIAGNOSIS — D045 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: Secondary | ICD-10-CM

## 2022-03-18 DIAGNOSIS — L821 Other seborrheic keratosis: Secondary | ICD-10-CM

## 2022-03-18 DIAGNOSIS — D485 Neoplasm of uncertain behavior of skin: Secondary | ICD-10-CM

## 2022-03-18 NOTE — Patient Instructions (Signed)

## 2022-03-20 ENCOUNTER — Ambulatory Visit: Payer: PPO | Admitting: Dermatology

## 2022-04-09 ENCOUNTER — Encounter: Payer: Self-pay | Admitting: Dermatology

## 2022-04-09 NOTE — Progress Notes (Signed)
? ?  Follow-Up Visit ?  ?Subjective  ?Peter Morgan is a 76 y.o. male who presents for the following: Follow-up (8 WEEK follow up. Possible biopsy on left post auricular. Patient also has a lesion on back and right chest. ). ? ?Growing spot on right chest, several other new crusts. ?Location:  ?Duration:  ?Quality:  ?Associated Signs/Symptoms: ?Modifying Factors:  ?Severity:  ?Timing: ?Context:  ? ?Objective  ?Well appearing patient in no apparent distress; mood and affect are within normal limits. ?Head - Anterior (Face) (4), Left Postauricular Area ?Flattopped brown 5 mm textured papules ? ?Head - Anterior (Face) (6), Left Buccal Cheek, Mid Forehead, Mid Supratip of Nose, Right Buccal Cheek, Right Mid Helix ?Dozen gritty and hornlike 3 to 4 mm pink crusts ? ?Right Breast ?8 mm waxy pink crust ? ? ? ? ? ? ? ? ?All skin waist up examined. ? ? ?Assessment & Plan  ? ? ?Seborrheic keratosis (5) ?Head - Anterior (Face) (4); Left Postauricular Area ? ?Leave if stable ? ?AK (actinic keratosis) (11) ?Head - Anterior (Face) (6); Right Mid Helix; Mid Forehead; Mid Supratip of Nose; Left Buccal Cheek; Right Buccal Cheek ? ?Destruction of lesion - Head - Anterior (Face), Left Buccal Cheek, Mid Forehead, Mid Supratip of Nose, Right Buccal Cheek, Right Mid Helix ?Complexity: simple   ?Destruction method: cryotherapy   ?Informed consent: discussed and consent obtained   ?Timeout:  patient name, date of birth, surgical site, and procedure verified ?Lesion destroyed using liquid nitrogen: Yes   ?Cryotherapy cycles:  3 ?Outcome: patient tolerated procedure well with no complications   ?Post-procedure details: wound care instructions given   ? 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 ?Skin / nail biopsy ?Type of biopsy: tangential   ?Informed consent: discussed and consent obtained   ?Timeout: patient name, date of birth, surgical site, and procedure verified   ?Anesthesia: the lesion was anesthetized in a standard  fashion   ?Anesthetic:  1% lidocaine w/ epinephrine 1-100,000 local infiltration ?Instrument used: flexible razor blade   ?Hemostasis achieved with: ferric subsulfate   ?Outcome: patient tolerated procedure well   ?Post-procedure details: wound care instructions given   ? ?Destruction of lesion ?Complexity: simple   ?Destruction method: electrodesiccation and curettage   ?Informed consent: discussed and consent obtained   ?Timeout:  patient name, date of birth, surgical site, and procedure verified ?Anesthesia: the lesion was anesthetized in a standard fashion   ?Anesthetic:  1% lidocaine w/ epinephrine 1-100,000 local infiltration ?Curettage performed in three different directions: Yes   ?Curettage cycles:  3 ?Lesion length (cm):  1 ?Lesion width (cm):  1 ?Margin per side (cm):  0 ?Final wound size (cm):  1 ?Hemostasis achieved with:  aluminum chloride ?Outcome: patient tolerated procedure well with no complications   ?Post-procedure details: wound care instructions given   ? ?Specimen 1 - Surgical pathology ?Differential Diagnosis: SCC vs BCC ?txpbx ?Check Margins: No ? ?After shave biopsy the base was treated with curettage plus cautery ? ? ? ? ? ?I, Lavonna Monarch, MD, have reviewed all documentation for this visit.  The documentation on 04/09/22 for the exam, diagnosis, procedures, and orders are all accurate and complete. ?

## 2022-05-28 ENCOUNTER — Ambulatory Visit: Payer: PPO | Admitting: Cardiology

## 2022-06-19 NOTE — Progress Notes (Signed)
Cardiology Office Note   Date:  06/20/2022   ID:  Peter Morgan, DOB 11-12-1946, MRN [id number]  PCP:  Tomasa Hose, NP  Cardiologist:   Minus Breeding, MD   Chief Complaint  Patient presents with   Coronary Artery Disease      History of Present Illness: Peter Morgan is a 76 y.o. male who presents  of CAD.  He had DES to his OM with nonobstructive disease elsewhere per cardiac catheterization on 12/04/2012.    He has done well since I saw him.The patient denies any new symptoms such as chest discomfort, neck or arm discomfort. There has been no new shortness of breath, PND or orthopnea. There have been no reported palpitations, presyncope or syncope.   Past Medical History:  Diagnosis Date   Anxiety    Arthritis    Basal cell carcinoma 12/18/1998   right forehead - CX3 + excision   Basal cell carcinoma 04/04/1993   post left shoulder - tx p bx   Basal cell carcinoma 04/04/1993   front, right base of neck - tx p bx   Basal cell carcinoma 04/20/2006   left side of nose - MOHs   Basal cell carcinoma 11/23/2013   top of crown - CX3 + 5FU + excision   Basal cell carcinoma 10/17/2014   left forearm - CX3 + 5FU   Basal cell carcinoma 05/21/2015   right shoulder - CX3 + 5FU   Basal cell carcinoma 09/04/2015   left mid line scalp [facility])   Basal cell carcinoma 09/04/2015   right mid line scalp [facility])   Basal cell carcinoma 09/04/2015   mid line inf. scalp (MOHs)   Basal cell carcinoma 09/04/2015   left upper arm (MOHs)   Basal cell carcinoma 11/26/2015   right shoulder - tx p bx   Basal cell carcinoma 02/27/2016   right ear rim, sup - CX3 + 5FU   Basal cell carcinoma 04/27/2019   mid forehead, central (MOHs)   Basal cell carcinoma 04/27/2019   left medial scalp (MOHs)   Basal cell carcinoma 10/03/2019   right scalp, post [facility])   Basal cell carcinoma 11/16/2019   right post scalp [facility])   BPH (benign prostatic hyperplasia)    CAD (coronary artery  disease)    Diagnosed 12/2011 following abnormal treadmill test - high grade OM stenosis s/p DES 01/05/12, mild residual non-obstructive LAD, Lcx, RCA disease.  Normal LV function with EF 55-65% by cath 01/05/12   GERD (gastroesophageal reflux disease)    Hyperlipidemia    Hypertension    Panic attack    PONV (postoperative nausea and vomiting)    SCCA (squamous cell carcinoma) of skin 12/10/2020   Left Lower Back (in situ)   SCCA (squamous cell carcinoma) of skin 12/10/2020   Left Inner Shoulder (in situ)   SCCA (squamous cell carcinoma) of skin 12/10/2020   Left Malar Cheek (in situ)   Squamous cell carcinoma of skin 10/17/2014   bridge of nose - CX3 + 5FU   Squamous cell carcinoma of skin 05/21/2015   left sideburn - CX3 + 5FU   Squamous cell carcinoma of skin 05/21/2015   right temple - CX3 + 5FU   Squamous cell carcinoma of skin 10/25/2015   right eye underneath - tx p bx   Squamous cell carcinoma of skin 03/27/2016   right side forehead - tx p bx   Squamous cell carcinoma of skin 04/28/2016   left chest -  CX3 + 5FU   Squamous cell carcinoma of skin 04/28/2016   sternum - CX3 + 5FU   Squamous cell carcinoma of skin 04/28/2016   right inferior clavicle - CX3 + 5FU   Squamous cell carcinoma of skin 12/17/2016   right anterior forehead - CX3 + 5FU   Squamous cell carcinoma of skin 07/13/2018   right upper forehead, inf - CX3 + 5FU   Squamous cell carcinoma of skin 04/27/2019   right mid forehead - CX3 + 5FU   Squamous cell carcinoma of skin 03/26/2020   situ- right mid forehead CX3+5FU    Past Surgical History:  Procedure Laterality Date   APPENDECTOMY     APPLICATION OF A-CELL OF HEAD/NECK N/A 12/27/2015   Procedure: A-CELL AND VAC PLACEMENT TO SCALP WOUND;  Surgeon: Wallace Going, DO;  Location: [place];  Service: Plastics;  Laterality: N/A;   APPLICATION OF A-CELL OF HEAD/NECK Left 01/24/2016   Procedure: APPLICATION OF A-CELL;  Surgeon: Wallace Going, DO;  Location: [place];  Service: Plastics;  Laterality: Left;   APPLICATION OF WOUND VAC N/A 01/24/2016   Procedure: APPLICATION OF WOUND VAC;  Surgeon: Wallace Going, DO;  Location: [place];  Service: Plastics;  Laterality: N/A;   coronary stents      FEMUR SURGERY     JOINT REPLACEMENT     KNEE ARTHROSCOPY     bilateral   LEFT HEART CATHETERIZATION WITH CORONARY ANGIOGRAM N/A 01/05/2012   Procedure: LEFT HEART CATHETERIZATION WITH CORONARY ANGIOGRAM;  Surgeon: Minus Breeding, MD;  Location: [facility] CATH LAB;  Service: Cardiovascular;  Laterality: N/A;   MOHS SURGERY     basal cell on scalp   NASAL SEPTUM SURGERY     right middle finger amputation      SKIN SPLIT GRAFT N/A 01/24/2016   Procedure: SPLIT THICKNESS SKIN GRAFT TO SCALP FROM THIGH;  Surgeon: Wallace Going, DO;  Location: [place];  Service: Plastics;  Laterality: N/A;   TONSILLECTOMY AND ADENOIDECTOMY     TOTAL KNEE ARTHROPLASTY Left 07/11/2013   Procedure: LEFT TOTAL KNEE ARTHROPLASTY ;  Surgeon: Gearlean Alf, MD;  Location: [facility];  Service: Orthopedics;  Laterality: Left;   TOTAL KNEE ARTHROPLASTY Right 07/10/2014   Procedure: RIGHT TOTAL KNEE ARTHROPLASTY;  Surgeon: Gearlean Alf, MD;  Location: [facility];  Service: Orthopedics;  Laterality: Right;     Current Outpatient Medications  Medication Sig Dispense Refill   acetaminophen (TYLENOL) 500 MG tablet Take 1,000 mg by mouth every 4 (four) hours as needed for pain. Pain     aspirin 81 MG tablet Take 81 mg by mouth daily.     atorvastatin (LIPITOR) 80 MG tablet Take 1 tablet (80 mg total) by mouth daily. 90 tablet 3   clonazePAM (KLONOPIN) 0.5 MG tablet TK 1 T PO BID     fluticasone (FLONASE) 50 MCG/ACT nasal spray Place 2 sprays into both nostrils daily.     metoprolol succinate (TOPROL-XL) 50 MG 24 hr tablet Take 25 mg by mouth every morning. Take with or immediately following a meal.      nitroGLYCERIN (NITROSTAT) 0.4 MG SL tablet Place 0.4 mg under the tongue every 5 (five) minutes as needed for chest pain.     pantoprazole (PROTONIX) 40 MG tablet Take 40 mg by mouth daily.     Prednisol Ace-Moxiflox-Bromfen 1-0.5-0.075 % SUSP Place 1 drop into the left eye 4 (four) times daily.  tamsulosin (FLOMAX) 0.4 MG CAPS Take 0.4 mg by mouth 2 (two) times daily.      No current facility-administered medications for this visit.    Allergies:   Codeine, Oxycodone, and Tramadol    ROS:  Please see the history of present illness.   Otherwise, review of systems are positive for none.   All other systems are reviewed and negative.    PHYSICAL EXAM: VS:  BP 118/68   Pulse (!) 58   Ht 5' 9.5" (1.765 m)   Wt 178 lb 9.6 oz (81 kg)   SpO2 99%   BMI 26.00 kg/m  , BMI Body mass index is 26 kg/m. GENERAL:  Well appearing NECK:  No jugular venous distention, waveform within normal limits, carotid upstroke brisk and symmetric, no bruits, no thyromegaly LUNGS:  Clear to auscultation bilaterally CHEST:  Unremarkable HEART:  PMI not displaced or sustained,S1 and S2 within normal limits, no S3, no S4, no clicks, no rubs, no murmurs ABD:  Flat, positive bowel sounds normal in frequency in pitch, no bruits, no rebound, no guarding, no midline pulsatile mass, no hepatomegaly, no splenomegaly EXT:  2 plus pulses throughout, no edema, no cyanosis no clubbing   EKG:  EKG is  ordered today. The ekg ordered today demonstrates sinus rhythm, rate 58, axis within normal limits, intervals within normal limits, no acute ST-T wave changes.   Recent Labs: No results found for requested labs within last 365 days.    Lipid Panel    Component Value Date/Time   CHOL 170 06/04/2021 0000   TRIG 77 06/04/2021 0000   HDL 56 06/04/2021 0000   CHOLHDL 3.0 06/04/2021 0000   CHOLHDL 3 02/24/2013 0804   VLDL 18.0 02/24/2013 0804   LDLCALC 99 06/04/2021 0000      Wt Readings from Last 3 Encounters:   06/20/22 178 lb 9.6 oz (81 kg)  04/11/21 180 lb (81.6 kg)  04/16/20 182 lb (82.6 kg)      Other studies Reviewed: Additional studies/ records that were reviewed today include: None Review of the above records demonstrates:  NA   ASSESSMENT AND PLAN:   CAD:   The patient has no new sypmtoms.  No further cardiovascular testing is indicated.  We will continue with aggressive risk reduction and meds as listed.  HTN:  The blood pressure is at target.  No change in therapy.  DYSLIPIDEMIA:   99 with an HDL 56 in June 2022.  I will check this again today.   Current medicines are reviewed at length with the patient today.  The patient does not have concerns regarding medicines.  The following changes have been made:   None  Labs/ tests ordered today include:      Orders Placed This Encounter  Procedures   Lipid panel   EKG 12-Lead     Disposition:   FU with me in one year.   Signed, Minus Breeding, MD  06/20/2022 10:15 AM    [place]

## 2022-06-20 ENCOUNTER — Encounter: Payer: Self-pay | Admitting: Cardiology

## 2022-06-20 ENCOUNTER — Ambulatory Visit: Payer: PPO | Admitting: Cardiology

## 2022-06-20 VITALS — BP 118/68 | HR 58 | Ht 69.5 in | Wt 178.6 lb

## 2022-06-20 DIAGNOSIS — I2510 Atherosclerotic heart disease of native coronary artery without angina pectoris: Secondary | ICD-10-CM | POA: Diagnosis not present

## 2022-06-20 DIAGNOSIS — R55 Syncope and collapse: Secondary | ICD-10-CM

## 2022-06-20 DIAGNOSIS — I10 Essential (primary) hypertension: Secondary | ICD-10-CM

## 2022-06-20 DIAGNOSIS — E785 Hyperlipidemia, unspecified: Secondary | ICD-10-CM | POA: Diagnosis not present

## 2022-06-20 LAB — LIPID PANEL
Chol/HDL Ratio: 2.8 ratio (ref 0.0–5.0)
Cholesterol, Total: 137 mg/dL (ref 100–199)
HDL: 49 mg/dL (ref >39)
LDL Chol Calc (NIH): 75 mg/dL (ref 0–99)
Triglycerides: 64 mg/dL (ref 0–149)
VLDL Cholesterol Cal: 13 mg/dL (ref 5–40)

## 2022-06-20 NOTE — Patient Instructions (Signed)

## 2022-06-26 ENCOUNTER — Encounter: Payer: Self-pay | Admitting: *Deleted

## 2022-07-31 ENCOUNTER — Other Ambulatory Visit: Payer: Self-pay | Admitting: Cardiology

## 2022-07-31 DIAGNOSIS — E785 Hyperlipidemia, unspecified: Secondary | ICD-10-CM

## 2022-09-09 ENCOUNTER — Telehealth: Payer: Self-pay | Admitting: Cardiology

## 2022-09-09 NOTE — Telephone Encounter (Signed)
Agree with plan for OV.    Loel Dubonnet, NP

## 2022-09-09 NOTE — Telephone Encounter (Signed)
LMTCB

## 2022-09-09 NOTE — Telephone Encounter (Signed)
Patient stated that yesterday, he became sob while using a chain saw, hauling logs, and not drinking enough water. When he cooled off and rested, he felt better. To relieve his anxiety, he took an extra 1/2 dose of klonopin. He is hydrating today. Urine light yellow. When he does cardio every day, he does not get sob.  He wanted to be seen in the clinic. Appointment made with Vella Raring for 9/28.

## 2022-09-09 NOTE — Telephone Encounter (Signed)
Pt c/o Shortness Of Breath: STAT if SOB developed within the last 24 hours or pt is noticeably SOB on the phone  1. Are you currently SOB (can you hear that pt is SOB on the phone)? No  2. How long have you been experiencing SOB?  Last 2 days  3. Are you SOB when sitting or when up moving around? Moving around, lifting or pulling something  4. Are you currently experiencing any other symptoms? Dizziness

## 2022-09-09 NOTE — Telephone Encounter (Signed)
Patient was returning call. Please advise ?

## 2022-09-10 NOTE — Progress Notes (Signed)
Office Visit    Patient Name: Peter Morgan Date of Encounter: 09/11/2022  PCP:  Tomasa Hose, NP   [place]  Cardiologist:  Minus Breeding, MD  Advanced Practice Provider:  No care team member to display Electrophysiologist:  None      Chief Complaint    Peter Morgan is a 76 y.o. male presents today for shortness of breath, dizziness  Past Medical History    Past Medical History:  Diagnosis Date   Anxiety    Arthritis    Basal cell carcinoma 12/18/1998   right forehead - CX3 + excision   Basal cell carcinoma 04/04/1993   post left shoulder - tx p bx   Basal cell carcinoma 04/04/1993   front, right base of neck - tx p bx   Basal cell carcinoma 04/20/2006   left side of nose - MOHs   Basal cell carcinoma 11/23/2013   top of crown - CX3 + 5FU + excision   Basal cell carcinoma 10/17/2014   left forearm - CX3 + 5FU   Basal cell carcinoma 05/21/2015   right shoulder - CX3 + 5FU   Basal cell carcinoma 09/04/2015   left mid line scalp [facility])   Basal cell carcinoma 09/04/2015   right mid line scalp [facility])   Basal cell carcinoma 09/04/2015   mid line inf. scalp (MOHs)   Basal cell carcinoma 09/04/2015   left upper arm (MOHs)   Basal cell carcinoma 11/26/2015   right shoulder - tx p bx   Basal cell carcinoma 02/27/2016   right ear rim, sup - CX3 + 5FU   Basal cell carcinoma 04/27/2019   mid forehead, central (MOHs)   Basal cell carcinoma 04/27/2019   left medial scalp (MOHs)   Basal cell carcinoma 10/03/2019   right scalp, post [facility])   Basal cell carcinoma 11/16/2019   right post scalp [facility])   BPH (benign prostatic hyperplasia)    CAD (coronary artery disease)    Diagnosed 12/2011 following abnormal treadmill test - high grade OM stenosis s/p DES 01/05/12, mild residual non-obstructive LAD, Lcx, RCA disease.  Normal LV function with EF 55-65% by cath 01/05/12   GERD (gastroesophageal reflux disease)    Hyperlipidemia     Hypertension    Panic attack    PONV (postoperative nausea and vomiting)    SCCA (squamous cell carcinoma) of skin 12/10/2020   Left Lower Back (in situ)   SCCA (squamous cell carcinoma) of skin 12/10/2020   Left Inner Shoulder (in situ)   SCCA (squamous cell carcinoma) of skin 12/10/2020   Left Malar Cheek (in situ)   Squamous cell carcinoma of skin 10/17/2014   bridge of nose - CX3 + 5FU   Squamous cell carcinoma of skin 05/21/2015   left sideburn - CX3 + 5FU   Squamous cell carcinoma of skin 05/21/2015   right temple - CX3 + 5FU   Squamous cell carcinoma of skin 10/25/2015   right eye underneath - tx p bx   Squamous cell carcinoma of skin 03/27/2016   right side forehead - tx p bx   Squamous cell carcinoma of skin 04/28/2016   left chest - CX3 + 5FU   Squamous cell carcinoma of skin 04/28/2016   sternum - CX3 + 5FU   Squamous cell carcinoma of skin 04/28/2016   right inferior clavicle - CX3 + 5FU   Squamous cell carcinoma of skin 12/17/2016   right anterior forehead - CX3 +  5FU   Squamous cell carcinoma of skin 07/13/2018   right upper forehead, inf - CX3 + 5FU   Squamous cell carcinoma of skin 04/27/2019   right mid forehead - CX3 + 5FU   Squamous cell carcinoma of skin 03/26/2020   situ- right mid forehead CX3+5FU   Past Surgical History:  Procedure Laterality Date   APPENDECTOMY     APPLICATION OF A-CELL OF HEAD/NECK N/A 12/27/2015   Procedure: A-CELL AND VAC PLACEMENT TO SCALP WOUND;  Surgeon: Wallace Going, DO;  Location: [place];  Service: Plastics;  Laterality: N/A;   APPLICATION OF A-CELL OF HEAD/NECK Left 01/24/2016   Procedure: APPLICATION OF A-CELL;  Surgeon: Wallace Going, DO;  Location: [place];  Service: Plastics;  Laterality: Left;   APPLICATION OF WOUND VAC N/A 01/24/2016   Procedure: APPLICATION OF WOUND VAC;  Surgeon: Wallace Going, DO;  Location: [place];  Service: Plastics;  Laterality:  N/A;   coronary stents      FEMUR SURGERY     JOINT REPLACEMENT     KNEE ARTHROSCOPY     bilateral   LEFT HEART CATHETERIZATION WITH CORONARY ANGIOGRAM N/A 01/05/2012   Procedure: LEFT HEART CATHETERIZATION WITH CORONARY ANGIOGRAM;  Surgeon: Minus Breeding, MD;  Location: [facility] CATH LAB;  Service: Cardiovascular;  Laterality: N/A;   MOHS SURGERY     basal cell on scalp   NASAL SEPTUM SURGERY     right middle finger amputation      SKIN SPLIT GRAFT N/A 01/24/2016   Procedure: SPLIT THICKNESS SKIN GRAFT TO SCALP FROM THIGH;  Surgeon: Wallace Going, DO;  Location: [place];  Service: Plastics;  Laterality: N/A;   TONSILLECTOMY AND ADENOIDECTOMY     TOTAL KNEE ARTHROPLASTY Left 07/11/2013   Procedure: LEFT TOTAL KNEE ARTHROPLASTY ;  Surgeon: Gearlean Alf, MD;  Location[facility];  Service: Orthopedics;  Laterality: Left;   TOTAL KNEE ARTHROPLASTY Right 07/10/2014   Procedure: RIGHT TOTAL KNEE ARTHROPLASTY;  Surgeon: Gearlean Alf, MD;  [facility];  Service: Orthopedics;  Laterality: Right;    Allergies  Allergies  Allergen Reactions   Codeine Nausea And Vomiting   Oxycodone Nausea Only   Tramadol Nausea And Vomiting    History of Present Illness    Peter Morgan is a 76 y.o. male with a hx of CAD (DES-OM 12/04/2012), hyperlipidemia, hypertension last seen 06/20/2022 by Dr. Percival Spanish.  Last seen 06/20/2022 by Dr. Percival Spanish doing well from a cardiac perspective.  No changes were made at that time and is recommended to follow-up in a year.  He contacted the office shortness of breath in the setting o fusing chain saw, hauling logs, not drinking enough water and felt better after rest and extra half tablet of klonopin.   He presents today for follow up. Tells me he has a tendency when he is pushing a load of wood to feel worn out, lightheaded, near syncopal.. 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 First episode most recently felt dizzy where he sat down,  drank some water, and it resolved quickly. Second episode was pushing load of wood up the hill which felt more significant. He sat in the air conditioning of his vehicle and felt better. No sensation of spinning. More near syncopal but no true syncope. He does not monitor heart rate or blood pressure at home.  No chest pain, edema, orthopnea, PND.   EKGs/Labs/Other Studies Reviewed:  The following studies were reviewed today:  EKG:  EKG is ordered today.  The ekg ordered today demonstrates SB 56 bpm with no acute ST/T wave changes.   Recent Labs: No results found for requested labs within last 365 days.  Recent Lipid Panel    Component Value Date/Time   CHOL 137 06/20/2022 1052   TRIG 64 06/20/2022 1052   HDL 49 06/20/2022 1052   CHOLHDL 2.8 06/20/2022 1052   CHOLHDL 3 02/24/2013 0804   VLDL 18.0 02/24/2013 0804   LDLCALC 75 06/20/2022 1052   Home Medications   Current Meds  Medication Sig   acetaminophen (TYLENOL) 500 MG tablet Take 1,000 mg by mouth every 4 (four) hours as needed for pain. Pain   aspirin 81 MG tablet Take 81 mg by mouth daily.   atorvastatin (LIPITOR) 80 MG tablet TAKE 1 TABLET(80 MG) BY MOUTH DAILY   clonazePAM (KLONOPIN) 0.5 MG tablet TK 1 T PO BID   fluticasone (FLONASE) 50 MCG/ACT nasal spray Place 2 sprays into both nostrils daily.   metoprolol succinate (TOPROL-XL) 50 MG 24 hr tablet Take 25 mg by mouth every morning. Take with or immediately following a meal.   nitroGLYCERIN (NITROSTAT) 0.4 MG SL tablet Place 0.4 mg under the tongue every 5 (five) minutes as needed for chest pain.   pantoprazole (PROTONIX) 40 MG tablet Take 40 mg by mouth daily.   Prednisol Ace-Moxiflox-Bromfen 1-0.5-0.075 % SUSP Place 1 drop into the left eye 4 (four) times daily.   tamsulosin (FLOMAX) 0.4 MG CAPS Take 0.4 mg by mouth 2 (two) times daily.      Review of Systems      All other systems reviewed and are otherwise negative except as noted above.  Physical Exam     VS:  BP 120/82   Pulse 68   Ht 5' 9.5" (1.765 m)   Wt 180 lb 12.8 oz (82 kg)   BMI 26.32 kg/m  , BMI Body mass index is 26.32 kg/m.  Wt Readings from Last 3 Encounters:  09/11/22 180 lb 12.8 oz (82 kg)  06/20/22 178 lb 9.6 oz (81 kg)  04/11/21 180 lb (81.6 kg)     GEN: Well nourished, well developed, in no acute distress. HEENT: normal. Neck: Supple, no JVD, carotid bruits, or masses. Cardiac: bradycardic, RRR, no murmurs, rubs, or gallops. No clubbing, cyanosis, edema.  Radials/PT 2+ and equal bilaterally.  Respiratory:  Respirations regular and unlabored, clear to auscultation bilaterally. GI: Soft, nontender, nondistended. MS: No deformity or atrophy. Skin: Warm and dry, no rash. Neuro:  Strength and sensation are intact. Psych: Normal affect.  Assessment & Plan    Lightheadedness -concern for orthostasis versus arrhythmia versus valvular dysfunction.  14-day ZIO monitor ordered-he will place at home after his upcoming fishing trip.  Update echocardiogram to rule out valvular abnormality. Education provided on orthostatic precautions: Stay well hydrated, eat regular meals, wear compression socks, with position changes slowly.  Versus deconditioning  CAD s/p DES-OM 12/04/2012 - GDMT aspirin, atorvastatin, metoprolol, PRN nitoglycerin. Stable with no anginal symptoms. No indication for ischemic evaluation.  Heart healthy diet and regular cardiovascular exercise encouraged.    Hyperlipidemia, LDL goal less than 70-06/20/2022 LDL 75. As lipids were reasonable, Dr. Percival Spanish recommended continue Atorvastatin '80mg'$  QD.   Hypertension- BP well controlled. Continue current antihypertensive regimen.          Disposition: Follow up in 3 month(s) with Minus Breeding, MD or APP.  Signed, Loel Dubonnet, NP 09/11/2022, 8:19  AM [place]

## 2022-09-11 ENCOUNTER — Encounter (HOSPITAL_BASED_OUTPATIENT_CLINIC_OR_DEPARTMENT_OTHER): Payer: Self-pay | Admitting: Family

## 2022-09-11 ENCOUNTER — Telehealth: Payer: Self-pay | Admitting: Family

## 2022-09-11 ENCOUNTER — Ambulatory Visit: Payer: PPO | Attending: Family

## 2022-09-11 ENCOUNTER — Ambulatory Visit (HOSPITAL_BASED_OUTPATIENT_CLINIC_OR_DEPARTMENT_OTHER): Payer: PPO | Admitting: Family

## 2022-09-11 VITALS — BP 120/82 | HR 68 | Ht 69.5 in | Wt 180.8 lb

## 2022-09-11 DIAGNOSIS — R42 Dizziness and giddiness: Secondary | ICD-10-CM

## 2022-09-11 DIAGNOSIS — I25118 Atherosclerotic heart disease of native coronary artery with other forms of angina pectoris: Secondary | ICD-10-CM

## 2022-09-11 DIAGNOSIS — R55 Syncope and collapse: Secondary | ICD-10-CM

## 2022-09-11 DIAGNOSIS — E785 Hyperlipidemia, unspecified: Secondary | ICD-10-CM | POA: Diagnosis not present

## 2022-09-11 MED ORDER — METOPROLOL SUCCINATE ER 25 MG PO TB24: 12.5000 mg | ORAL_TABLET | Freq: Every morning | ORAL | 3 refills | Status: DC

## 2022-09-11 NOTE — Patient Instructions (Addendum)
Medication Instructions:  Your physician has recommended you make the following change in your medication:    REDUCE Metoprolol to 12.'5mg'$  (half tablet) daily *this is to prevent your heart rate from getting too low which could cause your dizziness  *If you need a refill on your cardiac medications before your next appointment, please call your pharmacy*  Lab Work: None ordered today.   Testing/Procedures:  Your physician has requested that you have an echocardiogram. Echocardiography is a painless test that uses sound waves to create images of your heart. It provides your doctor with information about the size and shape of your heart and how well your heart's chambers and valves are working. This procedure takes approximately one hour. There are no restrictions for this procedure.  *this is to make sure your heart valves are working well and not causing dizziness _______________________________  Your physician has recommended that you wear a Zio monitor.  *this is to make sure there are no abnormal heart rhythms causing your dizziness  This monitor is a medical device that records the heart's electrical activity. Doctors most often use these monitors to diagnose arrhythmias. Arrhythmias are problems with the speed or rhythm of the heartbeat. The monitor is a small device applied to your chest. You can wear one while you do your normal daily activities. While wearing this monitor if you have any symptoms to push the button and record what you felt. Once you have worn this monitor for the period of time provider prescribed (14 days), you will return the monitor device in the postage paid box. Once it is returned they will download the data collected and provide [place] with a report which the provider will then review and we will call you with those results. Important tips:  Avoid showering during the first 24 hours of wearing the monitor. Avoid excessive sweating to help maximize wear time. Do not  submerge the device, no hot tubs, and no swimming pools. Keep any lotions or oils away from the patch. After 24 hours you may shower with the patch on. Take brief showers with your back facing the shower head.  Do not remove patch once it has been placed because that will interrupt data and decrease adhesive wear time. Push the button when you have any symptoms and write down what you were feeling. Once you have completed wearing your monitor, remove and place into box which has postage paid and place in your outgoing mailbox.  If for some reason you have misplaced your box then call our office and we can provide another box and/or mail it off for you.  Follow-Up: At [facility], you and your health needs are our priority.  As part of our continuing mission to provide you with exceptional heart care, we have created designated Provider Care Teams.  These Care Teams include your primary Cardiologist (physician) and Advanced Practice Providers (APPs -  Physician Assistants and Nurse Practitioners) who all work together to provide you with the care you need, when you need it.  We recommend signing up for the patient portal called "MyChart".  Sign up information is provided on this After Visit Summary.  MyChart is used to connect with patients for Virtual Visits (Telemedicine).  Patients are able to view lab/test results, encounter notes, upcoming appointments, etc.  Non-urgent messages can be sent to your provider as well.   To learn more about what you can do with MyChart, go to [email or web address].    Your next appointment:  3 month(s)  The format for your next appointment:   In Person  Provider:   Minus Breeding, MD  or Loel Dubonnet, NP    Other Instructions  Heart Healthy Diet Recommendations: A low-salt diet is recommended. Meats should be grilled, baked, or boiled. Avoid fried foods. Focus on lean protein sources like fish or chicken with vegetables and fruits. The  American Heart Association is a Microbiologist!  American Heart Association Diet and Lifeystyle Recommendations   Exercise recommendations: The American Heart Association recommends 150 minutes of moderate intensity exercise weekly. Try 30 minutes of moderate intensity exercise 4-5 times per week. This could include walking, jogging, or swimming.   Orthostatic Hypotension Blood pressure is a measurement of how strongly, or weakly, your circulating blood is pressing against the walls of your arteries. Orthostatic hypotension is a drop in blood pressure that can happen when you change positions, such as when you go from lying down to standing. Arteries are blood vessels that carry blood from your heart throughout your body. When blood pressure is too low, you may not get enough blood to your brain or to the rest of your organs. Orthostatic hypotension can cause light-headedness, sweating, rapid heartbeat, blurred vision, and fainting. These symptoms require further investigation into the cause. What are the causes? Orthostatic hypotension can be caused by many things, including: Sudden changes in posture, such as standing up quickly after you have been sitting or lying down. Loss of blood (anemia) or loss of body fluids (dehydration). Heart problems, neurologic problems, or hormone problems. Pregnancy. Aging. The risk for this condition increases as you get older. Severe infection (sepsis). Certain medicines, such as medicines for high blood pressure or medicines that make the body lose excess fluids (diuretics). What are the signs or symptoms? Symptoms of this condition may include: Weakness, light-headedness, or dizziness. Sweating. Blurred vision. Tiredness (fatigue). Rapid heartbeat. Fainting, in severe cases. How is this diagnosed? This condition is diagnosed based on: Your symptoms and medical history. Your blood pressure measurements. Your health care provider will check your blood  pressure when you are: Lying down. Sitting. Standing. A blood pressure reading is recorded as two numbers, such as "120 over 80" (or 120/80). The first ("top") number is called the systolic pressure. It is a measure of the pressure in your arteries as your heart beats. The second ("bottom") number is called the diastolic pressure. It is a measure of the pressure in your arteries when your heart relaxes between beats. Blood pressure is measured in a unit called mmHg. Healthy blood pressure for most adults is 120/80 mmHg. Orthostatic hypotension is defined as a 20 mmHg drop in systolic pressure or a 10 mmHg drop in diastolic pressure within 3 minutes of standing. Other information or tests that may be used to diagnose orthostatic hypotension include: Your other vital signs, such as your heart rate and temperature. Blood tests. An electrocardiogram (ECG) or echocardiogram. A Holter monitor. This is a device you wear that records your heart rhythm continuously, usually for 24-48 hours. Tilt table test. For this test, you will be safely secured to a table that moves you from a lying position to an upright position. Your heart rhythm and blood pressure will be monitored during the test. How is this treated? This condition may be treated by: Changing your diet. This may involve eating more salt (sodium) or drinking more water. Changing the dosage of certain medicines you are taking that might be lowering your blood pressure. Correcting the  underlying reason for the orthostatic hypotension. Wearing compression stockings. Taking medicines to raise your blood pressure. Avoiding actions that trigger symptoms. Follow these instructions at home: Medicines Take over-the-counter and prescription medicines only as told by your health care provider. Follow instructions from your health care provider about changing the dosage of your current medicines, if this applies. Do not stop or adjust any of your  medicines on your own. Eating and drinking  Drink enough fluid to keep your urine pale yellow. Eat extra salt only as directed. Do not add extra salt to your diet unless advised by your health care provider. Eat frequent, small meals. Avoid standing up suddenly after eating. General instructions  Get up slowly from lying down or sitting positions. This gives your blood pressure a chance to adjust. Avoid hot showers and excessive heat as directed by your health care provider. Engage in regular physical activity as directed by your health care provider. If you have compression stockings, wear them as told. Keep all follow-up visits. This is important. Contact a health care provider if: You have a fever for more than 2-3 days. You feel more thirsty than usual. You feel dizzy or weak. Get help right away if: You have chest pain. You have a fast or irregular heartbeat. You become sweaty or feel light-headed. You feel short of breath. You faint. You have any symptoms of a stroke. "BE FAST" is an easy way to remember the main warning signs of a stroke: B - Balance. Signs are dizziness, sudden trouble walking, or loss of balance. E - Eyes. Signs are trouble seeing or a sudden change in vision. F - Face. Signs are sudden weakness or numbness of the face, or the face or eyelid drooping on one side. A - Arms. Signs are weakness or numbness in an arm. This happens suddenly and usually on one side of the body. S - Speech. Signs are sudden trouble speaking, slurred speech, or trouble understanding what people say. T - Time. Time to call emergency services. Write down what time symptoms started. You have other signs of a stroke, such as: A sudden, severe headache with no known cause. Nausea or vomiting. Seizure. These symptoms may represent a serious problem that is an emergency. Do not wait to see if the symptoms will go away. Get medical help right away. Call your local emergency services (911  in the [place] Do not drive yourself to the hospital. Summary Orthostatic hypotension is a sudden drop in blood pressure. It can cause light-headedness, sweating, rapid heartbeat, blurred vision, and fainting. Orthostatic hypotension can be diagnosed by having your blood pressure taken while lying down, sitting, and then standing. Treatment may involve changing your diet, wearing compression stockings, sitting up slowly, adjusting your medicines, or correcting the underlying reason for the orthostatic hypotension. Get help right away if you have chest pain, a fast or irregular heartbeat, or symptoms of a stroke. This information is not intended to replace advice given to you by your health care provider. Make sure you discuss any questions you have with your health care provider. Document Revised: 02/14/2021 Document Reviewed: 02/14/2021 Elsevier Patient Education  [place].

## 2022-09-11 NOTE — Telephone Encounter (Signed)
Patient is requesting to speak with Laurann Montana or nurse. Please call back

## 2022-09-11 NOTE — Progress Notes (Unsigned)
Enrolled for [employer] to mail a ZIO XT long term holter monitor to the patients address on file.   Dr. Hochrein to read. 

## 2022-09-11 NOTE — Telephone Encounter (Signed)
Patient states he has been doing a half tablet of the '25mg'$ , advised patient to discontinue medication. Med list updated.    Per Laurann Montana, if already taking 12.'5mg'$  okay to d/c Metoprolol.

## 2022-09-11 NOTE — Telephone Encounter (Signed)
Patient called to schedule the Echocardiogram and 3 month follow up with Dr. Jefferson Fuel, NP---patient states he was told to start taking 12.5 of the Metoprolol Succinate--he states he is already taking that dosage and he is not sure what he should do.  He also has a question regarding the message "pick up these medications at the pharmacy"

## 2022-09-21 DIAGNOSIS — R42 Dizziness and giddiness: Secondary | ICD-10-CM

## 2022-09-21 DIAGNOSIS — I25118 Atherosclerotic heart disease of native coronary artery with other forms of angina pectoris: Secondary | ICD-10-CM

## 2022-09-21 DIAGNOSIS — E785 Hyperlipidemia, unspecified: Secondary | ICD-10-CM

## 2022-09-21 DIAGNOSIS — R55 Syncope and collapse: Secondary | ICD-10-CM | POA: Diagnosis not present

## 2022-09-25 ENCOUNTER — Other Ambulatory Visit (HOSPITAL_BASED_OUTPATIENT_CLINIC_OR_DEPARTMENT_OTHER): Payer: PPO

## 2022-10-10 ENCOUNTER — Ambulatory Visit (INDEPENDENT_AMBULATORY_CARE_PROVIDER_SITE_OTHER): Payer: PPO

## 2022-10-10 DIAGNOSIS — R55 Syncope and collapse: Secondary | ICD-10-CM | POA: Diagnosis not present

## 2022-10-10 DIAGNOSIS — R42 Dizziness and giddiness: Secondary | ICD-10-CM

## 2022-10-10 DIAGNOSIS — I25118 Atherosclerotic heart disease of native coronary artery with other forms of angina pectoris: Secondary | ICD-10-CM

## 2022-10-10 DIAGNOSIS — E785 Hyperlipidemia, unspecified: Secondary | ICD-10-CM

## 2022-10-10 LAB — ECHOCARDIOGRAM COMPLETE
AR max vel: 2.21 cm2
AV Area VTI: 2.35 cm2
AV Area mean vel: 2.27 cm2
AV Mean grad: 3 mmHg
AV Peak grad: 6.4 mmHg
Ao pk vel: 1.26 m/s
Area-P 1/2: 2.61 cm2
S' Lateral: 2.69 cm
Single Plane A4C EF: 60.8 %

## 2022-12-20 NOTE — Progress Notes (Unsigned)
Cardiology Office Note   Date:  12/20/2022   ID:  Peter Morgan, DOB October 13, 1946, MRN [id number]  PCP:  Tomasa Hose, NP  Cardiologist:   Minus Breeding, MD   No chief complaint on file.     History of Present Illness: Peter Morgan is a 77 y.o. male who presents  of CAD.  He had DES to his OM with nonobstructive disease elsewhere per cardiac catheterization on 12/04/2012.    He had some palpitations and in Sept was found to have SVT on a monitor.  ***   ***  He has done well since I saw him.The patient denies any new symptoms such as chest discomfort, neck or arm discomfort. There has been no new shortness of breath, PND or orthopnea. There have been no reported palpitations, presyncope or syncope.   Past Medical History:  Diagnosis Date   Anxiety    Arthritis    Basal cell carcinoma 12/18/1998   right forehead - CX3 + excision   Basal cell carcinoma 04/04/1993   post left shoulder - tx p bx   Basal cell carcinoma 04/04/1993   front, right base of neck - tx p bx   Basal cell carcinoma 04/20/2006   left side of nose - MOHs   Basal cell carcinoma 11/23/2013   top of crown - CX3 + 5FU + excision   Basal cell carcinoma 10/17/2014   left forearm - CX3 + 5FU   Basal cell carcinoma 05/21/2015   right shoulder - CX3 + 5FU   Basal cell carcinoma 09/04/2015   left mid line scalp [facility])   Basal cell carcinoma 09/04/2015   right mid line scalp [facility])   Basal cell carcinoma 09/04/2015   mid line inf. scalp (MOHs)   Basal cell carcinoma 09/04/2015   left upper arm (MOHs)   Basal cell carcinoma 11/26/2015   right shoulder - tx p bx   Basal cell carcinoma 02/27/2016   right ear rim, sup - CX3 + 5FU   Basal cell carcinoma 04/27/2019   mid forehead, central (MOHs)   Basal cell carcinoma 04/27/2019   left medial scalp (MOHs)   Basal cell carcinoma 10/03/2019   right scalp, post [facility])   Basal cell carcinoma 11/16/2019   right post scalp [facility])   BPH (benign  prostatic hyperplasia)    CAD (coronary artery disease)    Diagnosed 12/2011 following abnormal treadmill test - high grade OM stenosis s/p DES 01/05/12, mild residual non-obstructive LAD, Lcx, RCA disease.  Normal LV function with EF 55-65% by cath 01/05/12   GERD (gastroesophageal reflux disease)    Hyperlipidemia    Hypertension    Panic attack    PONV (postoperative nausea and vomiting)    SCCA (squamous cell carcinoma) of skin 12/10/2020   Left Lower Back (in situ)   SCCA (squamous cell carcinoma) of skin 12/10/2020   Left Inner Shoulder (in situ)   SCCA (squamous cell carcinoma) of skin 12/10/2020   Left [place] Cheek (in situ)   Squamous cell carcinoma of skin 10/17/2014   bridge of nose - CX3 + 5FU   Squamous cell carcinoma of skin 05/21/2015   left sideburn - CX3 + 5FU   Squamous cell carcinoma of skin 05/21/2015   right temple - CX3 + 5FU   Squamous cell carcinoma of skin 10/25/2015   right eye underneath - tx p bx   Squamous cell carcinoma of skin 03/27/2016   right side forehead - tx p  bx   Squamous cell carcinoma of skin 04/28/2016   left chest - CX3 + 5FU   Squamous cell carcinoma of skin 04/28/2016   sternum - CX3 + 5FU   Squamous cell carcinoma of skin 04/28/2016   right inferior clavicle - CX3 + 5FU   Squamous cell carcinoma of skin 12/17/2016   right anterior forehead - CX3 + 5FU   Squamous cell carcinoma of skin 07/13/2018   right upper forehead, inf - CX3 + 5FU   Squamous cell carcinoma of skin 04/27/2019   right mid forehead - CX3 + 5FU   Squamous cell carcinoma of skin 03/26/2020   situ- right mid forehead CX3+5FU    Past Surgical History:  Procedure Laterality Date   APPENDECTOMY     APPLICATION OF A-CELL OF HEAD/NECK N/A 12/27/2015   Procedure: A-CELL AND VAC PLACEMENT TO SCALP WOUND;  Surgeon: Wallace Going, DO;  Location: [place];  Service: Plastics;  Laterality: N/A;   APPLICATION OF A-CELL OF HEAD/NECK Left 01/24/2016    Procedure: APPLICATION OF A-CELL;  Surgeon: Wallace Going, DO;  Location: [place];  Service: Plastics;  Laterality: Left;   APPLICATION OF WOUND VAC N/A 01/24/2016   Procedure: APPLICATION OF WOUND VAC;  Surgeon: Wallace Going, DO;  Location: [place];  Service: Plastics;  Laterality: N/A;   coronary stents      FEMUR SURGERY     JOINT REPLACEMENT     KNEE ARTHROSCOPY     bilateral   LEFT HEART CATHETERIZATION WITH CORONARY ANGIOGRAM N/A 01/05/2012   Procedure: LEFT HEART CATHETERIZATION WITH CORONARY ANGIOGRAM;  Surgeon: Minus Breeding, MD;  Location: [facility];  Service: Cardiovascular;  Laterality: N/A;   MOHS SURGERY     basal cell on scalp   NASAL SEPTUM SURGERY     right middle finger amputation      SKIN SPLIT GRAFT N/A 01/24/2016   Procedure: SPLIT THICKNESS SKIN GRAFT TO SCALP FROM THIGH;  Surgeon: Wallace Going, DO;  Location: [place];  Service: Plastics;  Laterality: N/A;   TONSILLECTOMY AND ADENOIDECTOMY     TOTAL KNEE ARTHROPLASTY Left 07/11/2013   Procedure: LEFT TOTAL KNEE ARTHROPLASTY ;  Surgeon: Gearlean Alf, MD;  Location: [facility];  Service: Orthopedics;  Laterality: Left;   TOTAL KNEE ARTHROPLASTY Right 07/10/2014   Procedure: RIGHT TOTAL KNEE ARTHROPLASTY;  Surgeon: Gearlean Alf, MD;  Location: [facility];  Service: Orthopedics;  Laterality: Right;     Current Outpatient Medications  Medication Sig Dispense Refill   acetaminophen (TYLENOL) 500 MG tablet Take 1,000 mg by mouth every 4 (four) hours as needed for pain. Pain     aspirin 81 MG tablet Take 81 mg by mouth daily.     atorvastatin (LIPITOR) 80 MG tablet TAKE 1 TABLET(80 MG) BY MOUTH DAILY 90 tablet 3   clonazePAM (KLONOPIN) 0.5 MG tablet TK 1 T PO BID     fluticasone (FLONASE) 50 MCG/ACT nasal spray Place 2 sprays into both nostrils daily.     nitroGLYCERIN (NITROSTAT) 0.4 MG SL tablet Place 0.4 mg under the tongue every 5 (five) minutes as  needed for chest pain.     pantoprazole (PROTONIX) 40 MG tablet Take 40 mg by mouth daily.     Prednisol Ace-Moxiflox-Bromfen 1-0.5-0.075 % SUSP Place 1 drop into the left eye 4 (four) times daily.     tamsulosin (FLOMAX) 0.4 MG CAPS Take 0.4 mg by mouth  2 (two) times daily.      No current facility-administered medications for this visit.    Allergies:   Codeine, Oxycodone, and Tramadol    ROS:  Please see the history of present illness.   Otherwise, review of systems are positive for ***.   All other systems are reviewed and negative.    PHYSICAL EXAM: VS:  There were no vitals taken for this visit. , BMI There is no height or weight on file to calculate BMI. GENERAL:  Well appearing NECK:  No jugular venous distention, waveform within normal limits, carotid upstroke brisk and symmetric, no bruits, no thyromegaly LUNGS:  Clear to auscultation bilaterally CHEST:  Unremarkable HEART:  PMI not displaced or sustained,S1 and S2 within normal limits, no S3, no S4, no clicks, no rubs, *** murmurs ABD:  Flat, positive bowel sounds normal in frequency in pitch, no bruits, no rebound, no guarding, no midline pulsatile mass, no hepatomegaly, no splenomegaly EXT:  2 plus pulses throughout, no edema, no cyanosis no clubbing    ***  ***  GENERAL:  Well appearing NECK:  No jugular venous distention, waveform within normal limits, carotid upstroke brisk and symmetric, no bruits, no thyromegaly LUNGS:  Clear to auscultation bilaterally CHEST:  Unremarkable HEART:  PMI not displaced or sustained,S1 and S2 within normal limits, no S3, no S4, no clicks, no rubs, no murmurs ABD:  Flat, positive bowel sounds normal in frequency in pitch, no bruits, no rebound, no guarding, no midline pulsatile mass, no hepatomegaly, no splenomegaly EXT:  2 plus pulses throughout, no edema, no cyanosis no clubbing   EKG:  EKG is  *** ordered today. The ekg ordered today demonstrates sinus rhythm, rate ***, axis  within normal limits, intervals within normal limits, no acute ST-T wave changes.   Recent Labs: No results found for requested labs within last 365 days.    Lipid Panel    Component Value Date/Time   CHOL 137 06/20/2022 1052   TRIG 64 06/20/2022 1052   HDL 49 06/20/2022 1052   CHOLHDL 2.8 06/20/2022 1052   CHOLHDL 3 02/24/2013 0804   VLDL 18.0 02/24/2013 0804   LDLCALC 75 06/20/2022 1052      Wt Readings from Last 3 Encounters:  09/11/22 180 lb 12.8 oz (82 kg)  06/20/22 178 lb 9.6 oz (81 kg)  04/11/21 180 lb (81.6 kg)      Other studies Reviewed: Additional studies/ records that were reviewed today include: *** Review of the above records demonstrates:  ***   ASSESSMENT AND PLAN:   CAD:  ***   The patient has no new sypmtoms.  No further cardiovascular testing is indicated.  We will continue with aggressive risk reduction and meds as listed.  HTN:  The blood pressure is ** at target.  No change in therapy.  DYSLIPIDEMIA:   LDL is *** 99 with an HDL 56 in June 2022.  I will check this again today.   SVT:  ***   Current medicines are reviewed at length with the patient today.  The patient does not have concerns regarding medicines.  The following changes have been made:   ***  Labs/ tests ordered today include:      No orders of the defined types were placed in this encounter.    Disposition:   FU with me in ***  Signed, Minus Breeding, MD  12/20/2022 12:04 PM    [place]

## 2022-12-22 ENCOUNTER — Ambulatory Visit: Payer: PPO | Attending: Cardiology | Admitting: Cardiology

## 2022-12-22 ENCOUNTER — Encounter: Payer: Self-pay | Admitting: Cardiology

## 2022-12-22 VITALS — BP 130/72 | HR 77 | Ht 69.0 in | Wt 179.8 lb

## 2022-12-22 DIAGNOSIS — I10 Essential (primary) hypertension: Secondary | ICD-10-CM | POA: Diagnosis not present

## 2022-12-22 DIAGNOSIS — I2510 Atherosclerotic heart disease of native coronary artery without angina pectoris: Secondary | ICD-10-CM

## 2022-12-22 DIAGNOSIS — I471 Supraventricular tachycardia, unspecified: Secondary | ICD-10-CM

## 2022-12-22 DIAGNOSIS — E785 Hyperlipidemia, unspecified: Secondary | ICD-10-CM | POA: Diagnosis not present

## 2022-12-22 NOTE — Patient Instructions (Signed)
   Follow-Up: At [facility], you and your health needs are our priority.  As part of our continuing mission to provide you with exceptional heart care, we have created designated Provider Care Teams.  These Care Teams include your primary Cardiologist (physician) and Advanced Practice Providers (APPs -  Physician Assistants and Nurse Practitioners) who all work together to provide you with the care you need, when you need it.  We recommend signing up for the patient portal called "MyChart".  Sign up information is provided on this After Visit Summary.  MyChart is used to connect with patients for Virtual Visits (Telemedicine).  Patients are able to view lab/test results, encounter notes, upcoming appointments, etc.  Non-urgent messages can be sent to your provider as well.   To learn more about what you can do with MyChart, go to [email or web address].    Your next appointment:   12 month(s)  The format for your next appointment:   In Person  Provider:   James Hochrein, MD     

## 2023-03-25 ENCOUNTER — Ambulatory Visit: Payer: PPO | Admitting: Dermatology

## 2023-12-23 DIAGNOSIS — I471 Supraventricular tachycardia, unspecified: Secondary | ICD-10-CM | POA: Insufficient documentation

## 2023-12-23 NOTE — Progress Notes (Signed)
  Cardiology Office Note:   Date:  12/24/2023  ID:  Peter Morgan, DOB 1946-02-19, MRN [id number] PCP: Arby Lyle LABOR, NP  [facility] Providers Cardiologist:  Lynwood Schilling, MD {  History of Present Illness:   Peter Morgan is a 78 y.o. male who presents follow up of CAD.  He had DES to his OM with nonobstructive disease elsewhere per cardiac catheterization on 12/04/2012.    He had some palpitations and in Sept was found to have SVT on a monitor.  Since I last saw him he has had no new cardiovascular complaints.  He exercises routinely doing an exercise regimen every morning and walking when the weather allows.  He does some meditation. The patient denies any new symptoms such as chest discomfort, neck or arm discomfort. There has been no new shortness of breath, PND or orthopnea. There have been no reported palpitations, presyncope or syncope.  ROS: As stated in the HPI and negative for all other systems.  Studies Reviewed:    EKG:   EKG Interpretation Date/Time:  Thursday December 24 2023 11:23:50 EST Ventricular Rate:  61 PR Interval:  148 QRS Duration:  96 QT Interval:  434 QTC Calculation: 436 R Axis:   48  Text Interpretation: Normal sinus rhythm Normal ECG When compared with ECG of 24-Jan-2016 12:04, No significant change was found Confirmed by Schilling Lynwood ([id number]) on 12/24/2023 11:28:40 AM    Risk Assessment/Calculations:         Physical Exam:   VS:  BP 124/80 (BP Location: Left Arm, Patient Position: Sitting, Cuff Size: Normal)   Pulse 61   Ht 5' 9.5 (1.765 m)   Wt 181 lb (82.1 kg)   BMI 26.35 kg/m    Wt Readings from Last 3 Encounters:  12/24/23 181 lb (82.1 kg)  12/22/22 179 lb 12.8 oz (81.6 kg)  09/11/22 180 lb 12.8 oz (82 kg)     GEN: Well nourished, well developed in no acute distress NECK: No JVD; No carotid bruits CARDIAC: RRR, no murmurs, rubs, gallops RESPIRATORY:  Clear to auscultation without rales, wheezing or rhonchi  ABDOMEN:  Soft, non-tender, non-distended EXTREMITIES:  No edema; No deformity   ASSESSMENT AND PLAN:   CAD:  The patient has no new sypmtoms.  No further cardiovascular testing is indicated.  We will continue with aggressive risk reduction and meds as listed.   HTN:  The blood pressure is at target.  No change in therapy.   DYSLIPIDEMIA:   LDL is 71 with an HDL of 53.  No change in therapy.    SVT: He is not bothered by this.  No change in therapy.     Follow up with me in 1 year  Signed, Lynwood Schilling, MD

## 2023-12-24 ENCOUNTER — Encounter: Payer: Self-pay | Admitting: Cardiology

## 2023-12-24 ENCOUNTER — Ambulatory Visit: Payer: PPO | Attending: Cardiology | Admitting: Cardiology

## 2023-12-24 VITALS — BP 124/80 | HR 61 | Ht 69.5 in | Wt 181.0 lb

## 2023-12-24 DIAGNOSIS — I10 Essential (primary) hypertension: Secondary | ICD-10-CM

## 2023-12-24 DIAGNOSIS — I471 Supraventricular tachycardia, unspecified: Secondary | ICD-10-CM | POA: Diagnosis not present

## 2023-12-24 DIAGNOSIS — E785 Hyperlipidemia, unspecified: Secondary | ICD-10-CM | POA: Diagnosis not present

## 2023-12-24 DIAGNOSIS — I2510 Atherosclerotic heart disease of native coronary artery without angina pectoris: Secondary | ICD-10-CM

## 2023-12-24 NOTE — Patient Instructions (Signed)
 Medication Instructions:  No changes. *If you need a refill on your cardiac medications before your next appointment, please call your pharmacy*   Follow-Up: At [facility], you and your health needs are our priority.  As part of our continuing mission to provide you with exceptional heart care, we have created designated Provider Care Teams.  These Care Teams include your primary Cardiologist (physician) and Advanced Practice Providers (APPs -  Physician Assistants and Nurse Practitioners) who all work together to provide you with the care you need, when you need it.  We recommend signing up for the patient portal called "MyChart".  Sign up information is provided on this After Visit Summary.  MyChart is used to connect with patients for Virtual Visits (Telemedicine).  Patients are able to view lab/test results, encounter notes, upcoming appointments, etc.  Non-urgent messages can be sent to your provider as well.   To learn more about what you can do with MyChart, go to [email or web address].    Your next appointment:   1 year(s)  Provider:   Rollene Rotunda, MD

## 2024-01-06 ENCOUNTER — Ambulatory Visit: Payer: PPO | Admitting: Neurology

## 2024-01-06 ENCOUNTER — Encounter: Payer: Self-pay | Admitting: Neurology

## 2024-01-06 VITALS — BP 162/89 | HR 67 | Ht 69.0 in | Wt 182.0 lb

## 2024-01-06 DIAGNOSIS — R413 Other amnesia: Secondary | ICD-10-CM | POA: Diagnosis not present

## 2024-01-06 DIAGNOSIS — G3184 Mild cognitive impairment, so stated: Secondary | ICD-10-CM

## 2024-01-06 DIAGNOSIS — S069XAA Unspecified intracranial injury with loss of consciousness status unknown, initial encounter: Secondary | ICD-10-CM | POA: Insufficient documentation

## 2024-01-06 DIAGNOSIS — R480 Dyslexia and alexia: Secondary | ICD-10-CM

## 2024-01-06 DIAGNOSIS — S069X3S Unspecified intracranial injury with loss of consciousness of 1 hour to 5 hours 59 minutes, sequela: Secondary | ICD-10-CM

## 2024-01-06 DIAGNOSIS — F4310 Post-traumatic stress disorder, unspecified: Secondary | ICD-10-CM | POA: Diagnosis not present

## 2024-01-06 MED ORDER — DONEPEZIL HCL 5 MG PO TABS: 5.0000 mg | ORAL_TABLET | Freq: Every day | ORAL | 0 refills | Status: DC
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
## 2024-01-06 NOTE — Patient Instructions (Addendum)
There are well-accepted and sensible ways to reduce risk for Alzheimers disease and other degenerative brain disorders .  Exercise Daily Walk A daily 20 minute walk should be part of your routine. Disease related apathy can be a significant roadblock to exercise and the only way to overcome this is to make it a daily routine and perhaps have a reward at the end (something your loved one loves to eat or drink perhaps) or a personal trainer coming to the home can also be very useful. Most importantly, the patient is much more likely to exercise if the caregiver / spouse does it with him/her. In general a structured, repetitive schedule is best.  General Health: Any diseases which effect your body will effect your brain such as a pneumonia, urinary infection, blood clot, heart attack or stroke. Keep contact with your primary care doctor for regular follow ups.  Sleep. A good nights sleep is healthy for the brain. Seven hours is recommended. If you have insomnia or poor sleep habits we can give you some instructions. If you have sleep apnea wear your mask.  Diet: Eating a heart healthy diet is also a good idea; fish and poultry instead of red meat, nuts (mostly non-peanuts), vegetables, fruits, olive oil or canola oil (instead of butter), minimal salt (use other spices to flavor foods), whole grain rice, bread, cereal and pasta and wine in moderation.Research is now showing that the MIND diet, which is a combination of The Mediterranean diet and the DASH diet, is beneficial for cognitive processing and longevity. Information about this diet can be found in The MIND Diet, a book by Alonna Minium, MS, RDN, and online at [employer]  Finances, Power of [address] and Advance Directives: You should consider putting legal safeguards in place with regard to financial and medical decision making. While the spouse always has power of attorney for medical and financial issues in the  absence of any form, you should consider what you want in case the spouse / caregiver is no longer around or capable of making decisions.   The Alzheimers Association Position on Disease Prevention  Can Alzheimer's be prevented? It's a question that continues to intrigue researchers and fuel new investigations. There are no clear-cut answers yet -- partially due to the need for more large-scale studies in diverse populations -- but promising research is under way. The Alzheimer's Association is leading the worldwide effort to find a treatment for Alzheimer's, delay its onset and prevent it from developing.   What causes Alzheimer's? Experts agree that in the vast majority of cases, Alzheimer's, like other common chronic conditions, probably develops as a result of complex interactions among multiple factors, including age, genetics, environment, lifestyle and coexisting medical conditions. Although some risk factors -- such as age or genes -- cannot be changed, other risk factors -- such as high blood pressure and lack of exercise -- usually can be changed to help reduce risk. Research in these areas may lead to new ways to detect those at highest risk.  Prevention studies A small percentage of people with Alzheimer's disease (less than 1 percent) have an early-onset type associated with genetic mutations. Individuals who have these genetic mutations are guaranteed to develop the disease. An ongoing clinical trial conducted by the [facility] ([facility]), is testing whether antibodies to beta-amyloid can reduce the accumulation of beta-amyloid plaque in the brains of people with such genetic mutations and thereby reduce, delay or prevent symptoms. Participants in the trial are receiving antibodies (  or placebo) before they develop symptoms, and the development of beta-amyloid plaques is being monitored by brain scans and other tests.  Another clinical trial, known as the A4 trial  (Anti-Amyloid Treatment in Asymptomatic Alzheimer's), is testing whether antibodies to beta-amyloid can reduce the risk of Alzheimer's disease in older people (ages 73 to 29) at high risk for the disease. The A4 trial is being conducted by the Alzheimer's Disease Co[facility] Study.  Though research is still evolving, evidence is strong that people can reduce their risk by making key lifestyle changes, including participating in regular activity and maintaining good heart health. Based on this research, the Alzheimer's Association offers 10 Ways to Love Your Brain -- a collection of tips that can reduce the risk of cognitive decline.  Heart-head connection  New research shows there are things we can do to reduce the risk of mild cognitive impairment and dementia.  Several conditions known to increase the risk of cardiovascular disease -- such as high blood pressure, diabetes and high cholesterol -- also increase the risk of developing Alzheimer's. Some autopsy studies show that as many as 80 percent of individuals with Alzheimer's disease also have cardiovascular disease.  A longstanding question is why some people develop hallmark Alzheimer's plaques and tangles but do not develop the symptoms of Alzheimer's. Vascular disease may help researchers eventually find an answer. Some autopsy studies suggest that plaques and tangles may be present in the brain without causing symptoms of cognitive decline unless the brain also shows evidence of vascular disease. More research is needed to better understand the link between vascular health and Alzheimer's.  Physical exercise and diet Regular physical exercise may be a beneficial strategy to lower the risk of Alzheimer's and vascular dementia. Exercise may directly benefit brain cells by increasing blood and oxygen flow in the brain. Because of its known cardiovascular benefits, a medically approved exercise program is a valuable part of any overall wellness  plan.  Current evidence suggests that heart-healthy eating may also help protect the brain. Heart-healthy eating includes limiting the intake of sugar and saturated fats and making sure to eat plenty of fruits, vegetables, and whole grains. No one diet is best. Two diets that have been studied and may be beneficial are the DASH (Dietary Approaches to Stop Hypertension) diet and the Mediterranean diet. The DASH diet emphasizes vegetables, fruits and fat-free or low-fat dairy products; includes whole grains, fish, poultry, beans, seeds, nuts and vegetable oils; and limits sodium, sweets, sugary beverages and red meats. A Mediterranean diet includes relatively little red meat and emphasizes whole grains, fruits and vegetables, fish and shellfish, and nuts, olive oil and other healthy fats.  Social connections and intellectual activity A number of studies indicate that maintaining strong social connections and keeping mentally active as we age might lower the risk of cognitive decline and Alzheimer's. Experts are not certain about the reason for this association. It may be due to direct mechanisms through which social and mental stimulation strengthen connections between nerve cells in the brain.  Head trauma There appears to be a strong link between future risk of Alzheimer's and serious head trauma, especially when injury involves loss of consciousness. You can help reduce your risk of Alzheimer's by protecting your head.  Wear a seat belt  Use a helmet when participating in sports  "Fall-proof" your home   What you can do now While research is not yet conclusive, certain lifestyle choices, such as physical activity and diet, may help support brain  health and prevent Alzheimer's. Many of these lifestyle changes have been shown to lower the risk of other diseases, like heart disease and diabetes, which have been linked to Alzheimer's. With few drawbacks and plenty of known benefits, healthy lifestyle  choices can improve your health and possibly protect your brain.  Learn more about brain health. You can help increase our knowledge by considering participation in a clinical study. Our free clinical trial matching services, TrialMatch, can help you find clinical trials in your area that are seeking volunteers.  Understanding prevention research Here are some things to keep in mind about the research underlying much of our current knowledge about possible prevention:  Insights about potentially modifiable risk factors apply to large population groups, not to individuals. Studies can show that factor X is associated with outcome Y, but cannot guarantee that any specific person will have that outcome. As a result, you can "do everything right" and still have a serious health problem or "do everything wrong" and live to be 100.  Much of our current evidence comes from large epidemiological studies such as the [place] Aging Study, the Nurses' Health Study, the Adult Changes in Thought Study and the [employer]. These studies explore pre-existing behaviors and use statistical methods to relate those behaviors to health outcomes. This type of study can show an "association" between a factor and an outcome but cannot "prove" cause and effect. This is why we describe evidence based on these studies with such language as "suggests," "may show," "might protect," and "is associated with."  The gold standard for showing cause and effect is a clinical trial in which participants are randomly assigned to a prevention or risk management strategy or a control group. Researchers follow the two groups over time to see if their outcomes differ significantly.  It is unlikely that some prevention or risk management strategies will ever be tested in randomized trials for ethical or practical reasons. One example is exercise. Definitively testing the impact of exercise on Alzheimer's risk would require a huge  trial enrolling thousands of people and following them for many years. The expense and logistics of such a trial would be prohibitive, and it would require some people to go without exercise, a known health benefit.    Donepezil Tablets What is this medication? DONEPEZIL (doe NEP e zil) treats memory loss and confusion (dementia) in people who have Alzheimer disease. It works by improving attention, memory, and the ability to engage in daily activities. It is not a cure for dementia or Alzheimer disease. This medicine may be used for other purposes; ask your health care provider or pharmacist if you have questions. COMMON BRAND NAME(S): Aricept What should I tell my care team before I take this medication? They need to know if you have any of these conditions: Head injury Heart disease Irregular heartbeat or rhythm Liver disease Lung or breathing disease, such as asthma Seizures Stomach ulcers, other stomach or intestine problems Stomach bleeding Trouble passing urine An unusual or allergic reaction to donepezil, other medications, foods, dyes, or preservatives Pregnant or trying to get pregnant Breastfeeding How should I use this medication? Take this medication by mouth with a glass of water. Follow the directions on the prescription label. You may take this medication with or without food. Take this medication at regular intervals. This medication is usually taken before bedtime. Do not take it more often than directed. Continue to take your medication even if you feel better. Do not stop taking except  on your care team's advice. If you are taking the 23 mg donepezil tablet, swallow it whole; do not cut, crush, or chew it. Talk to your care team about the use of this medication in children. Special care may be needed. Overdosage: If you think you have taken too much of this medicine contact a poison control center or emergency room at once. NOTE: This medicine is only for you. Do not  share this medicine with others. What if I miss a dose? If you miss a dose, take it as soon as you can. If it is almost time for your next dose, take only that dose, do not take double or extra doses. What may interact with this medication? Do not take this medication with any of the following: Certain medications for fungal infections, such as itraconazole, fluconazole, posaconazole, voriconazole Cisapride Dextromethorphan; quinidine Dronedarone Pimozide Quinidine Thioridazine This medication may also interact with the following: Antihistamines for allergy, cough, and cold Atropine Bethanechol Carbamazepine Certain medications for bladder problems, such as oxybutynin or tolterodine Certain medications for Parkinson disease, such as benztropine or trihexyphenidyl Certain medications for stomach problems, such as dicyclomine or hyoscyamine Certain medications for travel sickness, such as scopolamine Dexamethasone Dofetilide Ipratropium NSAIDs, medications for pain and inflammation, such as ibuprofen or naproxen Other medications for Alzheimer disease Other medications that cause heart rhythm changes Phenobarbital Phenytoin Rifampin, rifabutin, or rifapentine Ziprasidone This list may not describe all possible interactions. Give your health care provider a list of all the medicines, herbs, non-prescription drugs, or dietary supplements you use. Also tell them if you smoke, drink alcohol, or use illegal drugs. Some items may interact with your medicine. What should I watch for while using this medication? Visit your care team for regular checks on your progress. Tell your care team if your symptoms do not start to get better or if they get worse. This medication may affect your coordination, reaction time, or judgment. Do not drive or operate machinery until you know how this medication affects you. Sit up or stand slowly to reduce the risk of dizzy or fainting spells. Drinking alcohol  with this medication can increase the risk of these side effects. What side effects may I notice from receiving this medication? Side effects that you should report to your care team as soon as possible: Allergic reactions--skin rash, itching, hives, swelling of the face, lips, tongue, or throat Peptic ulcer--burning stomach pain, loss of appetite, bloating, burping, heartburn, nausea, vomiting Seizures Slow heartbeat--dizziness, feeling faint or lightheaded, confusion, trouble breathing, unusual weakness or fatigue Stomach bleeding--bloody or black, tar-like stools, vomiting blood or brown material that looks like coffee grounds Trouble passing urine Side effects that usually do not require medical attention (report these to your care team if they continue or are bothersome): Diarrhea Fatigue Loss of appetite Muscle pain or cramps Nausea Trouble sleeping This list may not describe all possible side effects. Call your doctor for medical advice about side effects. You may report side effects to FDA at 1-800-FDA-1088. Where should I keep my medication? Keep out of reach of children. Store at room temperature between 15 and 30 degrees C (59 and 86 degrees F). Throw away any unused medication after the expiration date. NOTE: This sheet is a summary. It may not cover all possible information. If you have questions about this medicine, talk to your doctor, pharmacist, or health care provider.  2024 [employer]vier/[employer] Standard (2023-06-11 00:00:00)

## 2024-01-07 ENCOUNTER — Telehealth: Payer: Self-pay | Admitting: Neurology

## 2024-01-07 NOTE — Telephone Encounter (Signed)
[email or web address] no auth required sent to [facility] [phone]

## 2024-01-13 LAB — TSH+FREE T4
Free T4: 1.22 ng/dL (ref 0.82–1.77)
TSH: 2.2 u[IU]/mL (ref 0.450–4.500)

## 2024-01-13 LAB — COMPREHENSIVE METABOLIC PANEL
ALT: 18 [IU]/L (ref 0–44)
AST: 23 [IU]/L (ref 0–40)
Albumin: 4.3 g/dL (ref 3.8–4.8)
Alkaline Phosphatase: 73 [IU]/L (ref 44–121)
BUN/Creatinine Ratio: 18 (ref 10–24)
BUN: 17 mg/dL (ref 8–27)
Bilirubin Total: 0.4 mg/dL (ref 0.0–1.2)
CO2: 25 mmol/L (ref 20–29)
Calcium: 9.9 mg/dL (ref 8.6–10.2)
Chloride: 103 mmol/L (ref 96–106)
Creatinine, Ser: 0.94 mg/dL (ref 0.76–1.27)
Globulin, Total: 2.3 g/dL (ref 1.5–4.5)
Glucose: 92 mg/dL (ref 70–99)
Potassium: 4.6 mmol/L (ref 3.5–5.2)
Sodium: 143 mmol/L (ref 134–144)
Total Protein: 6.6 g/dL (ref 6.0–8.5)
eGFR: 83 mL/min/{1.73_m2} (ref >59)

## 2024-01-13 LAB — SEDIMENTATION RATE: Sed Rate: 2 mm/h (ref 0–30)

## 2024-01-13 LAB — PROTEIN ELECTROPHORESIS, SERUM
A/G Ratio: 1.5 (ref 0.7–1.7)
Albumin ELP: 4 g/dL (ref 2.9–4.4)
Alpha 1: 0.2 g/dL (ref 0.0–0.4)
Alpha 2: 0.5 g/dL (ref 0.4–1.0)
Beta: 1 g/dL (ref 0.7–1.3)
Gamma Globulin: 0.9 g/dL (ref 0.4–1.8)
Globulin, Total: 2.6 g/dL (ref 2.2–3.9)

## 2024-01-13 LAB — ATN PROFILE
A -- Beta-amyloid 42/40 Ratio: 0.131 (ref >0.102)
Beta-amyloid 40: 176.49 pg/mL
Beta-amyloid 42: 23.04 pg/mL
N -- NfL, Plasma: 1.94 pg/mL (ref 0.00–7.64)
T -- p-tau181: 0.91 pg/mL (ref 0.00–0.97)

## 2024-01-13 LAB — CBC WITH DIFFERENTIAL/PLATELET
Basophils Absolute: 0 10*3/uL (ref 0.0–0.2)
Basos: 1 %
EOS (ABSOLUTE): 0.2 10*3/uL (ref 0.0–0.4)
Eos: 4 %
Hematocrit: 40.8 % (ref 37.5–51.0)
Hemoglobin: 13.3 g/dL (ref 13.0–17.7)
Immature Grans (Abs): 0 10*3/uL (ref 0.0–0.1)
Immature Granulocytes: 0 %
Lymphocytes Absolute: 1.5 10*3/uL (ref 0.7–3.1)
Lymphs: 25 %
MCH: 32 pg (ref 26.6–33.0)
MCHC: 32.6 g/dL (ref 31.5–35.7)
MCV: 98 fL — ABNORMAL HIGH (ref 79–97)
Monocytes Absolute: 0.4 10*3/uL (ref 0.1–0.9)
Monocytes: 7 %
Neutrophils Absolute: 3.6 10*3/uL (ref 1.4–7.0)
Neutrophils: 63 %
Platelets: 176 10*3/uL (ref 150–450)
RBC: 4.16 x10E6/uL (ref 4.14–5.80)
RDW: 12.7 % (ref 11.6–15.4)
WBC: 5.8 10*3/uL (ref 3.4–10.8)

## 2024-01-13 LAB — METHYLMALONIC ACID, SERUM: Methylmalonic Acid: 422 nmol/L — ABNORMAL HIGH (ref 0–378)

## 2024-01-13 LAB — APOE ALZHEIMER'S RISK

## 2024-01-13 LAB — HEMOGLOBIN A1C
Est. average glucose Bld gHb Est-mCnc: 120 mg/dL
Hgb A1c MFr Bld: 5.8 % — ABNORMAL HIGH (ref 4.8–5.6)

## 2024-01-13 LAB — ANA W/REFLEX: Anti Nuclear Antibody (ANA): NEGATIVE

## 2024-01-13 LAB — HOMOCYSTEINE: Homocysteine: 11.7 umol/L (ref 0.0–19.2)

## 2024-01-25 ENCOUNTER — Ambulatory Visit: Payer: Self-pay | Admitting: Neurology

## 2024-01-29 ENCOUNTER — Ambulatory Visit
Admission: RE | Admit: 2024-01-29 | Discharge: 2024-01-29 | Disposition: A | Discharge status: Home / Self Care | Payer: PPO | Source: Ambulatory Visit | Attending: Neurology | Admitting: Neurology

## 2024-01-29 DIAGNOSIS — S069X3S Unspecified intracranial injury with loss of consciousness of 1 hour to 5 hours 59 minutes, sequela: Secondary | ICD-10-CM

## 2024-01-29 DIAGNOSIS — G3184 Mild cognitive impairment, so stated: Secondary | ICD-10-CM | POA: Diagnosis not present

## 2024-01-29 DIAGNOSIS — R413 Other amnesia: Secondary | ICD-10-CM

## 2024-01-29 DIAGNOSIS — R480 Dyslexia and alexia: Secondary | ICD-10-CM

## 2024-01-29 DIAGNOSIS — F4310 Post-traumatic stress disorder, unspecified: Secondary | ICD-10-CM

## 2024-01-31 ENCOUNTER — Encounter: Payer: Self-pay | Admitting: Neurology

## 2024-02-02 ENCOUNTER — Telehealth: Payer: Self-pay | Admitting: Neurology

## 2024-02-02 NOTE — Telephone Encounter (Signed)
Called and spoke with patient   See telephone encounter on 02/02/24

## 2024-02-02 NOTE — Telephone Encounter (Signed)
Called pt and spoke with patient and wife on speaking phone about MRI results.   Pt and wife verberlized they both understood. Wife said they have new PCP Peter Civatte, PA asked if results can be forwarded to her office.   New PCP updated in chart

## 2024-02-02 NOTE — Telephone Encounter (Signed)
Even though pt sent a my chart request on 2-16 he has called asking a message be sent that he be called with an explanation of the MRI results.

## 2024-02-08 ENCOUNTER — Encounter: Payer: Self-pay | Admitting: Neurology

## 2024-02-09 MED ORDER — DONEPEZIL HCL 10 MG PO TABS: 10.0000 mg | ORAL_TABLET | Freq: Every day | ORAL | 3 refills | Status: DC

## 2024-02-09 NOTE — Addendum Note (Signed)
 Addended by: Jacqualine Code D on: 02/09/2024 08:30 AM   Modules accepted: Orders

## 2024-02-09 NOTE — Telephone Encounter (Signed)
 10 mg sent

## 2024-03-05 ENCOUNTER — Encounter: Payer: Self-pay | Admitting: Neurology

## 2024-06-15 ENCOUNTER — Encounter: Payer: Self-pay | Admitting: *Deleted

## 2024-06-15 ENCOUNTER — Other Ambulatory Visit: Payer: Self-pay | Admitting: *Deleted

## 2024-06-15 MED ORDER — DONEPEZIL HCL 5 MG PO TABS: 5.0000 mg | ORAL_TABLET | Freq: Every day | ORAL | 5 refills | Status: DC
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
## 2024-07-07 NOTE — Patient Instructions (Addendum)
 Below is our plan:  We will continue donepezil  5mg  daily. Consider grounding. Consider melatonin extended release. We will check B12, today. Continue to work closely with PCP for mood management.   Please make sure you are staying well hydrated. I recommend 50-60 ounces daily. Well balanced diet and regular exercise encouraged. Consistent sleep schedule with 6-8 hours recommended.   Please continue follow up with care team as directed.   Follow up with me in 6 months   You may receive a survey regarding today's visit. I encourage you to leave honest feed back as I do use this information to improve patient care. Thank you for seeing me today!   Management of Memory Problems   There are some general things you can do to help manage your memory problems.  Your memory may not in fact recover, but by using techniques and strategies you will be able to manage your memory difficulties better.   1)  Establish a routine. Try to establish and then stick to a regular routine.  By doing this, you will get used to what to expect and you will reduce the need to rely on your memory.  Also, try to do things at the same time of day, such as taking your medication or checking your calendar first thing in the morning. Think about think that you can do as a part of a regular routine and make a list.  Then enter them into a daily planner to remind you.  This will help you establish a routine.   2)  Organize your environment. Organize your environment so that it is uncluttered.  Decrease visual stimulation.  Place everyday items such as keys or cell phone in the same place every day (ie.  Basket next to front door) Use post it notes with a brief message to yourself (ie. Turn off light, lock the door) Use labels to indicate where things go (ie. Which cupboards are for food, dishes, etc.) Keep a notepad and pen by the telephone to take messages   3)  Memory Aids A diary or journal/notebook/daily planner Making  a list (shopping list, chore list, to do list that needs to be done) Using an alarm as a reminder (kitchen timer or cell phone alarm) Using cell phone to store information (Notes, Calendar, Reminders) Calendar/White board placed in a prominent position Post-it notes   In order for memory aids to be useful, you need to have good habits.  It's no good remembering to make a note in your journal if you don't remember to look in it.  Try setting aside a certain time of day to look in journal.   4)  Improving mood and managing fatigue. There may be other factors that contribute to memory difficulties.  Factors, such as anxiety, depression and tiredness can affect memory. Regular gentle exercise can help improve your mood and give you more energy. Exercise: there are short videos created by the [employer] on Health specially for older adults: https://bit.ly/2I30q97.  Mediterranean diet: which emphasizes fruits, vegetables, whole grains, legumes, fish, and other seafood; unsaturated fats such as olive oils; and low amounts of red meat, eggs, and sweets. A variation of this, called MIND (Mediterranean-DASH Intervention for Neurodegenerative Delay) incorporates the DASH (Dietary Approaches to Stop Hypertension) diet, which has been shown to lower high blood pressure, a risk factor for Alzheimer's disease. More information at: ExitMarketing.de.  Aerobic exercise that improve heart health is also good for the mind.  [employer] on Aging  have short videos for exercises that you can do at home: BlindWorkshop.com.pt Simple relaxation techniques may help relieve symptoms of anxiety Try to get back to completing activities or hobbies you enjoyed doing in the past. Learn to pace yourself through activities to decrease fatigue. Find out about some local support groups where you can share experiences with others. Try and  achieve 7-8 hours of sleep at night.   Tasks to improve attention/working memory 1. Good sleep hygiene (7-8 hrs of sleep) 2. Learning a new skill (Painting, Carpentry, Pottery, new language, Knitting). 3.Cognitive exercises (keep a daily journal, Puzzles) 4. Physical exercise and training  (30 min/day X 4 days week) 5. Being on Antidepressant if needed 6.Yoga, Meditation, Tai Chi 7. Decrease alcohol intake 8.Have a clear schedule and structure in daily routine   MIND Diet: The Mediterranean-DASH Diet Intervention for Neurodegenerative Delay, or MIND diet, targets the health of the aging brain. Research participants with the highest MIND diet scores had a significantly slower rate of cognitive decline compared with those with the lowest scores. The effects of the MIND diet on cognition showed greater effects than either the Mediterranean or the DASH diet alone.   The healthy items the MIND diet guidelines suggest include:   3+ servings a day of whole grains 1+ servings a day of vegetables (other than green leafy) 6+ servings a week of green leafy vegetables 5+ servings a week of nuts 4+ meals a week of beans 2+ servings a week of berries 2+ meals a week of poultry 1+ meals a week of fish Mainly olive oil if added fat is used   The unhealthy items, which are higher in saturated and trans fat, include: Less than 5 servings a week of pastries and sweets Less than 4 servings a week of red meat (including beef, pork, lamb, and products made from these meats) Less than one serving a week of cheese and fried foods Less than 1 tablespoon a day of butter/stick margarine

## 2024-07-11 ENCOUNTER — Encounter: Payer: Self-pay | Admitting: Family Medicine

## 2024-07-11 ENCOUNTER — Other Ambulatory Visit: Payer: Self-pay | Admitting: Family Medicine

## 2024-07-11 ENCOUNTER — Ambulatory Visit: Payer: PPO | Admitting: Family Medicine

## 2024-07-11 VITALS — BP 150/92 | HR 77 | Ht 68.0 in | Wt 174.0 lb

## 2024-07-11 DIAGNOSIS — R480 Dyslexia and alexia: Secondary | ICD-10-CM | POA: Diagnosis not present

## 2024-07-11 DIAGNOSIS — S069X3S Unspecified intracranial injury with loss of consciousness of 1 hour to 5 hours 59 minutes, sequela: Secondary | ICD-10-CM | POA: Diagnosis not present

## 2024-07-11 DIAGNOSIS — G3184 Mild cognitive impairment, so stated: Secondary | ICD-10-CM

## 2024-07-11 DIAGNOSIS — R413 Other amnesia: Secondary | ICD-10-CM | POA: Diagnosis not present

## 2024-07-11 DIAGNOSIS — F4310 Post-traumatic stress disorder, unspecified: Secondary | ICD-10-CM | POA: Diagnosis not present

## 2024-07-12 ENCOUNTER — Ambulatory Visit: Payer: Self-pay | Admitting: Family Medicine

## 2024-07-12 LAB — B12 AND FOLATE PANEL
Folate: 4.6 ng/mL (ref >3.0)
Vitamin B-12: 945 pg/mL (ref 232–1245)

## 2024-08-23 ENCOUNTER — Telehealth: Payer: Self-pay | Admitting: Neurology

## 2024-08-23 ENCOUNTER — Encounter: Payer: Self-pay | Admitting: Neurology

## 2024-08-23 NOTE — Telephone Encounter (Signed)
 Spoke w/Pt wife regarding MRI request. Wife stated Pt had Video Visit with PCP today and PCP is asking if our office will order MRI. Discussed with wife reasons PCP is making request - stated Pt is more disoriented, more confused, more memory loss. Reports Pt has been off klonopin 3-4 weeks not and has recently started Wellbutrin. Reports Pt having difficulty with walking and balance but is also having a problem with arthritis in one of his feet, which may be contributing to the walking and balance issues. Informed Pt wife will send message to provider regarding MRI. Wife voiced understanding and thanks for the call.

## 2024-08-23 NOTE — Telephone Encounter (Signed)
 Pt wife called to PCP requested FOR pt to have another MRI scan done

## 2024-08-24 NOTE — Telephone Encounter (Signed)
 Responded to Pt wife via [facility] as wife had sent message as well.

## 2024-08-26 NOTE — Telephone Encounter (Signed)
 Cld Pt wife to clarify date for f/u appt with provider. Date was entered incorrectly in [facility] message. Apologized for the confusion and date error. Pt wife voiced understanding. Clarified the appt date of 9/17 at 10 AM. Pt wife accepted the appt to f/u with Dr. Chalice. Wife voiced thanks for the call and the appt.

## 2024-08-26 NOTE — Telephone Encounter (Signed)
 Pt's wife is asking for a call back to clarify date pt can come in for a f/u with Dr Chalice

## 2024-08-28 ENCOUNTER — Encounter: Payer: Self-pay | Admitting: Neurology

## 2024-08-31 ENCOUNTER — Ambulatory Visit: Admitting: Neurology

## 2024-09-05 ENCOUNTER — Encounter: Payer: Self-pay | Admitting: Counselor

## 2024-09-05 ENCOUNTER — Encounter: Payer: Self-pay | Admitting: Psychology

## 2024-09-14 ENCOUNTER — Telehealth: Payer: Self-pay | Admitting: Family Medicine

## 2024-09-14 NOTE — Telephone Encounter (Signed)
 MY[facility] cxl

## 2024-09-19 ENCOUNTER — Telehealth: Payer: Self-pay | Admitting: Neurology

## 2024-09-19 NOTE — Telephone Encounter (Signed)
 Pt wife called to Cancel appt . Pt wife has Covid and Pt is staying home .   Pt Sates Appt is Far Out is it Possible for PT to be seen  sooner , I have Placed PT on Waitlist

## 2024-09-19 NOTE — Telephone Encounter (Signed)
 Please call wife and offer sooner appt with Dr Chalice on 10/22 at 11:30, check-in at 96.

## 2024-09-20 ENCOUNTER — Ambulatory Visit: Admitting: Neurology

## 2024-09-20 NOTE — Telephone Encounter (Signed)
 Got it, thanks!
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
## 2024-10-05 ENCOUNTER — Ambulatory Visit: Admitting: Neurology

## 2024-10-05 ENCOUNTER — Encounter: Payer: Self-pay | Admitting: Neurology

## 2024-10-05 VITALS — BP 169/86 | HR 71 | Ht 69.0 in | Wt 170.0 lb

## 2024-10-05 DIAGNOSIS — F4310 Post-traumatic stress disorder, unspecified: Secondary | ICD-10-CM

## 2024-10-05 DIAGNOSIS — G3184 Mild cognitive impairment, so stated: Secondary | ICD-10-CM | POA: Diagnosis not present

## 2024-10-05 DIAGNOSIS — G4752 REM sleep behavior disorder: Secondary | ICD-10-CM | POA: Insufficient documentation

## 2024-10-05 DIAGNOSIS — R480 Dyslexia and alexia: Secondary | ICD-10-CM

## 2024-10-05 DIAGNOSIS — S069X3S Unspecified intracranial injury with loss of consciousness of 1 hour to 5 hours 59 minutes, sequela: Secondary | ICD-10-CM

## 2024-10-05 DIAGNOSIS — R413 Other amnesia: Secondary | ICD-10-CM

## 2024-10-05 MED ORDER — ALPRAZOLAM 0.5 MG PO TABS: 0.5000 mg | ORAL_TABLET | Freq: Every evening | ORAL | 0 refills | Status: AC | PRN

## 2024-10-05 MED ORDER — DONEPEZIL HCL 10 MG PO TABS: 10.0000 mg | ORAL_TABLET | Freq: Every day | ORAL | 11 refills | Status: AC

## 2024-10-05 NOTE — Patient Instructions (Signed)
 Donepezil Tablets What is this medication? DONEPEZIL (doe NEP e zil) treats memory loss and confusion (dementia) in people who have Alzheimer disease. It works by improving attention, memory, and the ability to engage in daily activities. It is not a cure for dementia or Alzheimer disease. This medicine may be used for other purposes; ask your health care provider or pharmacist if you have questions. COMMON BRAND NAME(S): Aricept What should I tell my care team before I take this medication? They need to know if you have any of these conditions: Head injury Heart disease Irregular heartbeat or rhythm Liver disease Lung or breathing disease, such as asthma Seizures Stomach ulcers, other stomach or intestine problems Stomach bleeding Trouble passing urine An unusual or allergic reaction to donepezil, other medications, foods, dyes, or preservatives Pregnant or trying to get pregnant Breastfeeding How should I use this medication? Take this medication by mouth with a glass of water. Follow the directions on the prescription label. You may take this medication with or without food. Take this medication at regular intervals. This medication is usually taken before bedtime. Do not take it more often than directed. Continue to take your medication even if you feel better. Do not stop taking except on your care team's advice. If you are taking the 23 mg donepezil tablet, swallow it whole; do not cut, crush, or chew it. Talk to your care team about the use of this medication in children. Special care may be needed. Overdosage: If you think you have taken too much of this medicine contact a poison control center or emergency room at once. NOTE: This medicine is only for you. Do not share this medicine with others. What if I miss a dose? If you miss a dose, take it as soon as you can. If it is almost time for your next dose, take only that dose, do not take double or extra doses. What may interact  with this medication? Do not take this medication with any of the following: Certain medications for fungal infections, such as itraconazole, fluconazole, posaconazole, voriconazole Cisapride Dextromethorphan; quinidine Dronedarone Pimozide Quinidine Thioridazine This medication may also interact with the following: Antihistamines for allergy, cough, and cold Atropine Bethanechol Carbamazepine Certain medications for bladder problems, such as oxybutynin or tolterodine Certain medications for Parkinson disease, such as benztropine or trihexyphenidyl Certain medications for stomach problems, such as dicyclomine or hyoscyamine Certain medications for travel sickness, such as scopolamine Dexamethasone Dofetilide Ipratropium NSAIDs, medications for pain and inflammation, such as ibuprofen or naproxen Other medications for Alzheimer disease Other medications that cause heart rhythm changes Phenobarbital Phenytoin Rifampin, rifabutin, or rifapentine Ziprasidone This list may not describe all possible interactions. Give your health care provider a list of all the medicines, herbs, non-prescription drugs, or dietary supplements you use. Also tell them if you smoke, drink alcohol, or use illegal drugs. Some items may interact with your medicine. What should I watch for while using this medication? Visit your care team for regular checks on your progress. Tell your care team if your symptoms do not start to get better or if they get worse. This medication may affect your coordination, reaction time, or judgment. Do not drive or operate machinery until you know how this medication affects you. Sit up or stand slowly to reduce the risk of dizzy or fainting spells. Drinking alcohol with this medication can increase the risk of these side effects. What side effects may I notice from receiving this medication? Side effects that you should report  to your care team as soon as possible: Allergic  reactions--skin rash, itching, hives, swelling of the face, lips, tongue, or throat Peptic ulcer--burning stomach pain, loss of appetite, bloating, burping, heartburn, nausea, vomiting Seizures Slow heartbeat--dizziness, feeling faint or lightheaded, confusion, trouble breathing, unusual weakness or fatigue Stomach bleeding--bloody or black, tar-like stools, vomiting blood or brown material that looks like coffee grounds Trouble passing urine Side effects that usually do not require medical attention (report these to your care team if they continue or are bothersome): Diarrhea Fatigue Loss of appetite Muscle pain or cramps Nausea Trouble sleeping This list may not describe all possible side effects. Call your doctor for medical advice about side effects. You may report side effects to FDA at 1-800-FDA-1088. Where should I keep my medication? Keep out of reach of children. Store at room temperature between 15 and 30 degrees C (59 and 86 degrees F). Throw away any unused medication after the expiration date. NOTE: This sheet is a summary. It may not cover all possible information. If you have questions about this medicine, talk to your doctor, pharmacist, or health care provider.  2024 Elsevier/Gold Standard (2023-06-11 00:00:00)

## 2024-10-26 ENCOUNTER — Telehealth: Payer: Self-pay | Admitting: Neurology

## 2024-10-26 NOTE — Telephone Encounter (Signed)
 [facility] HTA pending   Sent mychart

## 2024-11-01 ENCOUNTER — Ambulatory Visit: Admitting: Podiatry

## 2024-11-03 ENCOUNTER — Ambulatory Visit (INDEPENDENT_AMBULATORY_CARE_PROVIDER_SITE_OTHER)

## 2024-11-03 ENCOUNTER — Ambulatory Visit: Admitting: Podiatry

## 2024-11-03 VITALS — Ht 69.0 in | Wt 170.0 lb

## 2024-11-03 DIAGNOSIS — M79671 Pain in right foot: Secondary | ICD-10-CM

## 2024-11-03 DIAGNOSIS — M2041 Other hammer toe(s) (acquired), right foot: Secondary | ICD-10-CM | POA: Diagnosis not present

## 2024-11-03 DIAGNOSIS — M216X1 Other acquired deformities of right foot: Secondary | ICD-10-CM

## 2024-11-03 DIAGNOSIS — M19071 Primary osteoarthritis, right ankle and foot: Secondary | ICD-10-CM

## 2024-11-03 NOTE — Patient Instructions (Signed)
  VISIT SUMMARY: Today, we discussed your ongoing foot pain, particularly in the arch, big toe joint, and middle toes. We reviewed your history of drop foot and previous treatments, including steroid injections and orthotics. We also examined your current pain management strategies and explored potential surgical options.  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 We will refer you to an orthotist for custom molded foot orthotics to provide support and offload pressure. If orthotics do not provide sufficient relief, we will consider surgical options such as joint fusion or sensory nerve removal. A diagnostic nerve block will be performed to assess the potential benefit of sensory nerve removal.  -OSTEOARTHRITIS OF RIGHT FIRST MTP (BIG TOE) JOINT: This condition involves moderate to severe arthritis in the big toe joint, causing pain. If orthotics do not alleviate the pain, we will consider an injection in the big toe joint to help with inflammation.  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 If orthotics do not alleviate the pain, we will consider an in-office procedure to straighten the toes by cutting tendons.  -RIGHT FOOT DROP, SEQUELA OF PRIOR TRAUMA: This condition involves chronic muscle weakness and toe contractures due to prior trauma. We will continue with your current management and monitor for any changes.  INSTRUCTIONS: Please follow up with the orthotist for custom molded foot orthotics. We will also schedule a diagnostic nerve block to assess the potential benefit of sensory nerve removal. If orthotics do not provide sufficient relief, we will discuss further surgical options, including joint fusion or sensory nerve removal, and consider an  injection in the big toe joint or an in-office procedure to straighten the toes.                      Contains text generated by [employer].                                 Contains text generated by [employer].
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
## 2024-11-15 NOTE — Telephone Encounter (Signed)
 HTA shara: [id number] (exp. 10/26/24 to 01/24/25)

## 2024-11-18 ENCOUNTER — Telehealth: Payer: Self-pay | Admitting: Registered Nurse

## 2024-11-21 ENCOUNTER — Encounter: Admitting: Psychology

## 2024-11-21 DIAGNOSIS — R4189 Other symptoms and signs involving cognitive functions and awareness: Secondary | ICD-10-CM | POA: Diagnosis present

## 2024-11-21 DIAGNOSIS — F411 Generalized anxiety disorder: Secondary | ICD-10-CM | POA: Diagnosis present
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
## 2024-11-22 ENCOUNTER — Ambulatory Visit: Admitting: Neurology

## 2024-11-22 DIAGNOSIS — S069X3S Unspecified intracranial injury with loss of consciousness of 1 hour to 5 hours 59 minutes, sequela: Secondary | ICD-10-CM

## 2024-11-22 DIAGNOSIS — G3184 Mild cognitive impairment, so stated: Secondary | ICD-10-CM

## 2024-11-22 DIAGNOSIS — G4752 REM sleep behavior disorder: Secondary | ICD-10-CM | POA: Diagnosis not present

## 2024-11-22 DIAGNOSIS — R413 Other amnesia: Secondary | ICD-10-CM

## 2024-11-22 DIAGNOSIS — R480 Dyslexia and alexia: Secondary | ICD-10-CM

## 2024-11-22 DIAGNOSIS — F4310 Post-traumatic stress disorder, unspecified: Secondary | ICD-10-CM

## 2024-11-23 ENCOUNTER — Encounter: Payer: Self-pay | Admitting: Psychology

## 2024-11-28 ENCOUNTER — Ambulatory Visit

## 2024-11-28 DIAGNOSIS — M216X1 Other acquired deformities of right foot: Secondary | ICD-10-CM

## 2024-11-28 DIAGNOSIS — M19071 Primary osteoarthritis, right ankle and foot: Secondary | ICD-10-CM

## 2024-11-28 NOTE — Progress Notes (Signed)
 Visit:  ORTHOTIC SCAN/ EVALUATION  Patient presents with is wife for evaluation/ scan for custom molded foot orthotics.  Patient will benefit from custom foot orthotics to provide total contact to bilateral medial longitudinal arches to help balance and distribute body weight more evenly.  Thus reducing plantar pressure and pain.   Orthotic will encourage forefoot and rearfoot alignment.    Patient was scanned today with OHI scanner.    Orthotics are ordered.  Signature obtained for notification of pricing/ fees for the device.  When the orthotic is ready for pick up, will call to make an appointment for a fitting.    Chauncy Mangiaracina, DPM

## 2024-12-01 ENCOUNTER — Ambulatory Visit: Payer: Self-pay | Admitting: Neurology

## 2024-12-01 NOTE — Procedures (Signed)
 [facility] POLYSOMNOGRAPHY  -PARASOMNIA MONTAGE REPORT   STUDY DATE:  11/22/2024     PATIENT NAME:  Peter Morgan         DATE OF BIRTH:  November 17, 1946  PATIENT ID:  [id number]    TYPE OF STUDY:  PSG  PHYSICIAN: DEDRA GORES, MD   SCORING TECHNICIAN: Donnice Counts, RPSGT   HISTORY:  This 78 year-old Male patient was seen on 10-05-2024 and has a chief complaint of  . 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 This summer he weaned off Klonopin, and in response developed sudden severe panic attacks, sense of doom - some spells are possibly night terrors , some are more like REM-BD.  He has weaned off Depakote, after side effects of tremors and also because he linked this medication to vivid dreams as well  ADDITIONAL INFORMATION:  The Epworth Sleepiness Scale was endorsed at 9 /24 points (scores above or equal to 10 are suggestive of hypersomnolence).  Height: 69 in Weight: 170 lb (BMI 25) Neck Size: 15 in  MEDICATIONS: Tylenol , Aspirin , Lipitor, Vitajoy B-12 gummies, Aricept , Melatonin gummies, Nitrostat , Protonix , Flomax  TECHNICAL DESCRIPTION: A registered sleep technologist ( RPSGT)  was in attendance for the duration of the recording.  Data collection, scoring, video monitoring, and reporting were performed in compliance with the AASM Manual for the Scoring of Sleep and Associated Events; (Hypopnea is scored based on the criteria listed in Section VIII D. 1b in the AASM Manual V2.6 using a 4% oxygen desaturation rule or Hypopnea is scored based on the criteria listed in Section VIII D. 1a in the AASM Manual V2.6 using 3% oxygen desaturation and /or arousal rule).   SLEEP CONTINUITY AND SLEEP ARCHITECTURE:  Lights-out was at 22:10: and lights-on at  05:00:, with  6.8 hours of recording time. Total sleep time ( TST) was 291.5 minutes with a decreased sleep efficiency at 71.1%.  Sleep latency was 31.0 minutes.  REM sleep  latency was 287.5 minutes. Of the total sleep time, the percentage of stage N1 sleep was 11.8%, stage N2 sleep was 87%, stage N3 sleep was 0.0%, and REM sleep was only 0.9%.  There were 1 Stage R periods observed on this study night, 29 awakenings (i.e. transitions to Stage W from any sleep stage), and 84 total stage transitions. Wake after sleep onset (WASO) time accounted for .   BODY POSITION:  TST was divided  between the following sleep positions: supine 145 minutes (50%), non-supine 147 minutes (50%); right 123 minutes (42%), left 07 minutes (2%), and prone 00 minutes (0%). Total supine REM sleep time was 02 minutes (100% of total REM sleep).  RESPIRATORY MONITORING:  Based on CMS criteria (using a 4% oxygen desaturation rule for scoring hypopneas), there was 1 apnea (1 mixed), and 0 hypopneas.  Apnea index was 0.2. Hypopnea index was 0.0. The AHI (apnea-hypopnea index) was 0.2 overall (0.0 supine, 0 non-supine; 0.0 REM, 0.0 supine REM).  There were 0 respiratory effort-related arousals (RERAs).   OXIMETRY: Oxyhemoglobin Saturation Nadir during sleep was at  90% from a mean of 93%. Total sleep time (TST) hypoxemia (=<88%) was 0.0 minutes, or 0.0% of total sleep time.  LIMB MOVEMENTS: There were 0 periodic limb movements of sleep (0.0/hr), of which 0 (0.0/hr) were associated with an arousal. AROUSAL: There were 47 arousals in total.  Of these, 1 was identified as respiratory-related arousals (0 /h), 0 were PLM-related arousals (0 /h), and  46 were non-specific arousals (9 /h). EEG:  PSG EEG was of normal amplitude and frequency, with symmetric manifestation of sleep stages. EKG: The electrocardiogram documented regular cardiac rhythm with isolated PVCs .  The average heart rate during sleep was 51 bpm.  The heart rate during sleep varied between a minimum of 46  and  a maximum of  75 bpm. AUDIO and VIDEO: no PLMs,   IMPRESSION:   No Sleep apnea, no REM BD captured.  1) Total sleep time was reduced  at 291.5 minutes.  Sleep efficiency was decreased at 71.1%.  REM sleep was suppressed, only 1% of total sleep time was  REM sleep. The patient's medications (aricept  and melatonin) are usually increasing REM sleep, so I am surprised how little REM sleep there was.  2) There were no PLMs during REM sleep  - this would have been the hallmark finding in REM BD.  3) There were plenty of arousals that seemed to arise from the EEG ( from the brain directly) , no movements precipitated these, nor snoring, nor drop in oxygen. No audible snoring was noted at all.   RECOMMENDATIONS:  I have none.  The sleep architecture was fairly normal except for the very low proportion of REM sleep.  In neuro-degenerative cognitive disorders there can be more vivid dreams seen, more sleep talking, some enactment of dreams. I have not seen any of these signs.      DEDRA GORES,  MD

## 2024-12-02 ENCOUNTER — Encounter: Payer: Self-pay | Admitting: Cardiology

## 2024-12-05 ENCOUNTER — Encounter: Payer: Self-pay | Admitting: Podiatrist

## 2024-12-05 NOTE — Telephone Encounter (Signed)
-----   Message from Dedra Gores, MD sent at 12/01/2024  4:38 PM EST ----- IMPRESSION:   No Sleep apnea, no REM BD captured.  1) Total sleep time was reduced at 291.5 minutes.  Sleep efficiency was decreased at 71.1%.  REM sleep was suppressed, only 1% of total sleep time was  REM sleep. The patient's medications (aricept   and melatonin) are usually increasing REM sleep, so I am surprised how little REM sleep there was.  2) There were no PLMs during REM sleep  - this would have been the hallmark finding in REM BD.  3) There were plenty of arousals that seemed to arise from the EEG ( from the brain directly) , no movements precipitated these, nor snoring, nor drop in oxygen. No audible snoring was noted at all.   The sleep architecture was fairly normal except for the very low proportion of REM sleep.  In neuro-degenerative cognitive disorders there can be more vivid dreams seen, more sleep talking, some enactment of dreams. I have not seen any of these signs in this study.   RECOMMENDATIONS:  I have none!.  Continue keeping a good bedtime and rise time routine, no alcohol, and moderate physical activity, preferably outdoors.

## 2024-12-05 NOTE — Telephone Encounter (Signed)
 Spoke to patient gave sleep study results and Dr Dohmeier recommendations Pt expressed understanding and thanked me for calling

## 2024-12-05 NOTE — Telephone Encounter (Signed)
 LVM for patient to call back and discuss sleep study results

## 2024-12-05 NOTE — Telephone Encounter (Signed)
 Pt has returned call to [facility]

## 2024-12-14 ENCOUNTER — Encounter

## 2024-12-14 DIAGNOSIS — R4189 Other symptoms and signs involving cognitive functions and awareness: Secondary | ICD-10-CM | POA: Diagnosis not present

## 2024-12-14 DIAGNOSIS — F411 Generalized anxiety disorder: Secondary | ICD-10-CM

## 2024-12-14 NOTE — Progress Notes (Signed)
 "  Mental Status/Behavioral Observations (12/14/2024):  Orientation: The patient was oriented to self, place, and time. Sensory/Arousal: Hearing and vision were adequate for testing. The patient was alert. Appearance: Dress and hygiene were appropriate for the setting.  Speech/Language: In conversation, the patient's speech was prosodic, fluent, and well-articulated. The patient displayed no indications of word finding difficulties and no word substitution errors were observed.  Motor: The patient ambulated independently and without issue. A slight tremor was observed on the right hand.  Social Comportment: Social behavior was appropriate for the setting. The patient occasionally made jokes throughout the testing session. Mood/Affect: Mood was largely neutral to occasionally anxious. Affect was consistent with mood.  Attention/Concentration: The patient appeared to maintain consistent engagement throughout the testing session. No frank attentional lapses were observed.  Thought Process/Content: The patient's thought process was coherent, linear, goal directed. There were no indications of psychosis.  Additional Observations: The patient showed minimal difficulties with understanding task instructions, though he occasionally requested for instructions to be repeated. Some mild difficulties with frustration tolerance were noted. The patient reported intermittent anxiety throughout the testing session. Neuropsychology Note Peter Morgan completed 120 minutes of neuropsychological testing with technician, Josue Ned, BA, under the supervision of Evalene Riff, PsyD., Clinical Neuropsychologist. The patient did not appear overtly distressed by the testing session, per behavioral observation or via self-report to the technician. Rest breaks were offered.   Clinical Decision Making: In considering the patient's current level of functioning, level of presumed impairment, nature of symptoms, emotional  and behavioral responses during clinical interview, level of literacy, and observed level of motivation/effort, a battery of tests was selected by Dr. Riff during initial consultation on 11/21/2024. This was communicated to the technician. Communication between the neuropsychologist and technician was ongoing throughout the testing session and changes were made as deemed necessary based on patient performance on testing, technician observations and additional pertinent factors such as those listed above.  Tests Administered: Automatic Data Edition (BNT-2) Brief Visuospatial Memory Test-Revised (BVMT-R) Benton Judgment of Line Orientation (JOLO) Clock Drawing Test Controlled Oral Word Association Test (FAS & Animals) Orthoptist System (D-KEFS), select subtests Hopkins Verbal Learning Test - Revised (HVLT-R) Rey Complex Figure Test (RCFT), select subtests Symbol Digit Modalities Test (SDMT), oral version Trail Making Test (TMT; Part A & B) Wechsler Adult Intelligence Scale-Fourth Edition (WAIS-IV), select subtests Wechsler Memory Scale-Fourth Edition (WMS-IV) , select subtests Wechsler Memory Scale-Third Edition (WMS-III), select subtests  Geriatric Depression Scale-Short Form (GDS-SF) Geriatric Anxiety Inventory (GAI)   Results: Note: This summary of test scores accompanies the interpretive report and should not be interpreted by unqualified individuals or in isolation without reference to the report. Test scores are relative to age, gender, and educational history as available and appropriate. Measurement properties of test scores: IQ, Index, and Standard Scores (SS): Mean = 100; Standard Deviation = 15; Scaled Scores (ss): Mean = 10; Standard Deviation = 3; Z scores (Z): Mean = 0; Standard Deviation = 1; T scores (T); Mean = 50; Standard Deviation = 10  ATTENTION AND WORKING MEMORY    Norm Score Percentile  Range  WAIS-IV          Digit Span  ss = 7 16 %ile  Low Average   DSF  ss = 8 25 %ile Average   Span:    5      DSB  ss = 8 25 %ile Average   Span:    3      DSS  ss = 6 9 %ile Low Average   Span:    3     WMS-III          Spatial Span  ss = 10 50 %ile Average   SSF  ss = 8 25 %ile Average   Span:    4      SSB  ss = 12 75 %ile High Average   Span:    5      PROCESSING SPEED    Norm Score Percentile  Range  WAIS-IV          Symbol Search  ss = 8 25 %ile Average  SDMT (Oral)  SD = -1.5     Below Average   LANGUAGE    Norm Score Percentile  Range  Boston Naming Test (BNT-2)  T = 56 73 %ile Average  COWAT          FAS  T = 45 30 %ile Average   Animals  T = 48 42 %ile Average   EXECUTIVE FUNCTIONING    Norm Score Percentile  Range  DKEFS - Color-Word Interference          Color Naming  ss = 9 37 %ile Average   Word Reading  ss = 10 50 %ile Average   Inhibition  ss = 8 25 %ile Average   Errors  ss = 12 75 %ile High Average   Inhibition Switching  ss = 3 1 %ile Exceptionally Low   Errors  ss = 10 50 %ile Average  TMT A  T = 40 16 %ile Low Average  TMT B   T = 35 7 %ile Below Average  WAIS- IV          Vocabulary  ss = 10 50 %ile Average   MEMORY    Norm Score Percentile  Range  BVMT-R          Trial 1  T = 42.0 21 %ile Low Average   Trial 2  T = 36 8 %ile Below Average   Trial 3  T = 40.0 16 %ile Low Average   Total Recall  T = 38.0 12 %ile Low Average   Learning  T = 46.0 32 %ile Average   Delayed Recall  T = 43.0 25 %ile Average   % Retained    100 >16 %ile WNL   Hits     6-10 %ile Low to Below Average   False Alarms     >16 %ile WNL   Recognition Discriminability     11-16 %ile Low Average  HVLT          Total Recall  T = 34.0 5 %ile Below Average   Delayed Recall  T = 38 12 %ile Low Average   %Retention  T = 56.0 73 %ile Average   Recognition Discriminability  T = 37.0 9 %ile Low Average  Wechsler Memory Scale, 4th Edition (WMS-4)         Log. Mem. Immediate Recall  ss = 7 16 %ile Low Average   Logical Memory  Delayed Recall  ss = 8 25 %ile Average   Logical Recognition    26th-50th %ile Average   VISUAL-SPATIAL    Norm Score Percentile  Range  WAIS-IV          Matrix Reasoning  ss = 8 25 %ile Average  Benton JOLO  ss = 14 91 %ile Above Average  Rey Complex Figure Copy       >  16 %ile WNL  Clock          PERSONALITY AND BEHAVIORAL FUNCTIONING      Score/Interpretation  GDS-SF Raw       8  GDS-SF Severity       Mild.  GAI Raw       16  GAI Severity       Elevated    Feedback to Patient: Peter Morgan will return on 12/29/2024 for an interactive feedback session with Dr. Hayden at which time his test performances, clinical impressions and treatment recommendations will be reviewed in detail. The patient understands he can contact our office should he require our assistance before this time.  120 minutes spent face-to-face with patient administering standardized tests, 60 minutes spent scoring radiographer, therapeutic). [CPT [id number], [id number]]  Full report to follow. "

## 2024-12-21 ENCOUNTER — Encounter: Attending: Psychology | Admitting: Psychology

## 2024-12-21 DIAGNOSIS — F411 Generalized anxiety disorder: Secondary | ICD-10-CM | POA: Insufficient documentation

## 2024-12-21 DIAGNOSIS — R4189 Other symptoms and signs involving cognitive functions and awareness: Secondary | ICD-10-CM | POA: Insufficient documentation

## 2024-12-29 ENCOUNTER — Encounter: Admitting: Psychology

## 2024-12-30 ENCOUNTER — Telehealth: Payer: Self-pay | Admitting: Podiatry

## 2024-12-30 NOTE — Telephone Encounter (Signed)
 Orthotics are in [place] office. LVM to call the office to schedule an appointment for P/U

## 2025-01-09 ENCOUNTER — Other Ambulatory Visit

## 2025-01-10 ENCOUNTER — Other Ambulatory Visit

## 2025-01-12 ENCOUNTER — Ambulatory Visit

## 2025-01-12 DIAGNOSIS — M216X1 Other acquired deformities of right foot: Secondary | ICD-10-CM

## 2025-01-12 DIAGNOSIS — M216X2 Other acquired deformities of left foot: Secondary | ICD-10-CM

## 2025-01-12 DIAGNOSIS — M19071 Primary osteoarthritis, right ankle and foot: Secondary | ICD-10-CM

## 2025-01-12 DIAGNOSIS — M2041 Other hammer toe(s) (acquired), right foot: Secondary | ICD-10-CM

## 2025-01-12 NOTE — Progress Notes (Signed)

## 2025-01-19 ENCOUNTER — Encounter: Payer: Self-pay | Admitting: Neurology

## 2025-02-01 ENCOUNTER — Ambulatory Visit: Admitting: Cardiology

## 2025-02-01 ENCOUNTER — Ambulatory Visit: Admitting: Family Medicine

## 2025-02-02 ENCOUNTER — Ambulatory Visit: Admitting: Podiatry

## 2025-02-22 ENCOUNTER — Ambulatory Visit: Admitting: Neurology
